# Patient Record
Sex: Female | Born: 1935
Health system: Southern US, Community
[De-identification: ages and names within clinical notes are randomized; demographics above are authoritative.]

## PROBLEM LIST (undated history)

## (undated) DIAGNOSIS — E039 Hypothyroidism, unspecified: Secondary | ICD-10-CM

## (undated) DIAGNOSIS — E079 Disorder of thyroid, unspecified: Secondary | ICD-10-CM

## (undated) DIAGNOSIS — I872 Venous insufficiency (chronic) (peripheral): Secondary | ICD-10-CM

## (undated) DIAGNOSIS — N390 Urinary tract infection, site not specified: Secondary | ICD-10-CM

## (undated) DIAGNOSIS — I480 Paroxysmal atrial fibrillation: Secondary | ICD-10-CM

## (undated) HISTORY — DX: Hypothyroidism, unspecified: E03.9

## (undated) HISTORY — PX: BACK SURGERY: SHX140

## (undated) HISTORY — DX: Paroxysmal atrial fibrillation: I48.0

## (undated) HISTORY — PX: JOINT REPLACEMENT: SHX530

---

## 1998-08-20 ENCOUNTER — Other Ambulatory Visit: Admission: RE | Admit: 1998-08-20 | Discharge: 1998-08-20 | Payer: Self-pay | Admitting: *Deleted

## 1999-09-10 ENCOUNTER — Other Ambulatory Visit: Admission: RE | Admit: 1999-09-10 | Discharge: 1999-09-10 | Payer: Self-pay | Admitting: *Deleted

## 2001-04-21 ENCOUNTER — Other Ambulatory Visit: Admission: RE | Admit: 2001-04-21 | Discharge: 2001-04-21 | Payer: Self-pay | Admitting: *Deleted

## 2002-10-29 ENCOUNTER — Other Ambulatory Visit: Admission: RE | Admit: 2002-10-29 | Discharge: 2002-10-29 | Payer: Self-pay | Admitting: *Deleted

## 2004-10-12 ENCOUNTER — Encounter: Payer: Self-pay | Admitting: Endocrinology

## 2005-01-13 ENCOUNTER — Encounter: Payer: Self-pay | Admitting: Endocrinology

## 2005-07-19 ENCOUNTER — Encounter: Payer: Self-pay | Admitting: Endocrinology

## 2006-07-07 ENCOUNTER — Encounter: Payer: Self-pay | Admitting: Endocrinology

## 2007-08-02 ENCOUNTER — Encounter: Payer: Self-pay | Admitting: Endocrinology

## 2007-09-07 ENCOUNTER — Encounter: Payer: Self-pay | Admitting: Endocrinology

## 2008-10-28 ENCOUNTER — Encounter: Payer: Self-pay | Admitting: Endocrinology

## 2008-12-06 ENCOUNTER — Inpatient Hospital Stay (HOSPITAL_COMMUNITY): Admission: RE | Admit: 2008-12-06 | Discharge: 2008-12-10 | Payer: Self-pay | Admitting: Orthopedic Surgery

## 2009-01-27 ENCOUNTER — Encounter: Payer: Self-pay | Admitting: Endocrinology

## 2009-05-13 ENCOUNTER — Encounter: Payer: Self-pay | Admitting: Endocrinology

## 2009-06-03 ENCOUNTER — Encounter: Admission: RE | Admit: 2009-06-03 | Discharge: 2009-06-03 | Payer: Self-pay | Admitting: Family Medicine

## 2009-06-11 ENCOUNTER — Ambulatory Visit: Payer: Self-pay | Admitting: Endocrinology

## 2009-06-11 DIAGNOSIS — R32 Unspecified urinary incontinence: Secondary | ICD-10-CM

## 2009-06-11 DIAGNOSIS — E890 Postprocedural hypothyroidism: Secondary | ICD-10-CM

## 2009-06-11 HISTORY — DX: Unspecified urinary incontinence: R32

## 2009-06-11 HISTORY — DX: Postprocedural hypothyroidism: E89.0

## 2009-06-25 ENCOUNTER — Encounter: Payer: Self-pay | Admitting: Endocrinology

## 2010-05-19 IMAGING — CR DG HIP 1V PORT*R*
1 series · 1 of 1 positions shown · non-contrast
Comparison: 12/02/2008

CLINICAL DATA: Right hip arthroplasty

PORTABLE RIGHT HIP - 1 VIEW

[view not recorded]
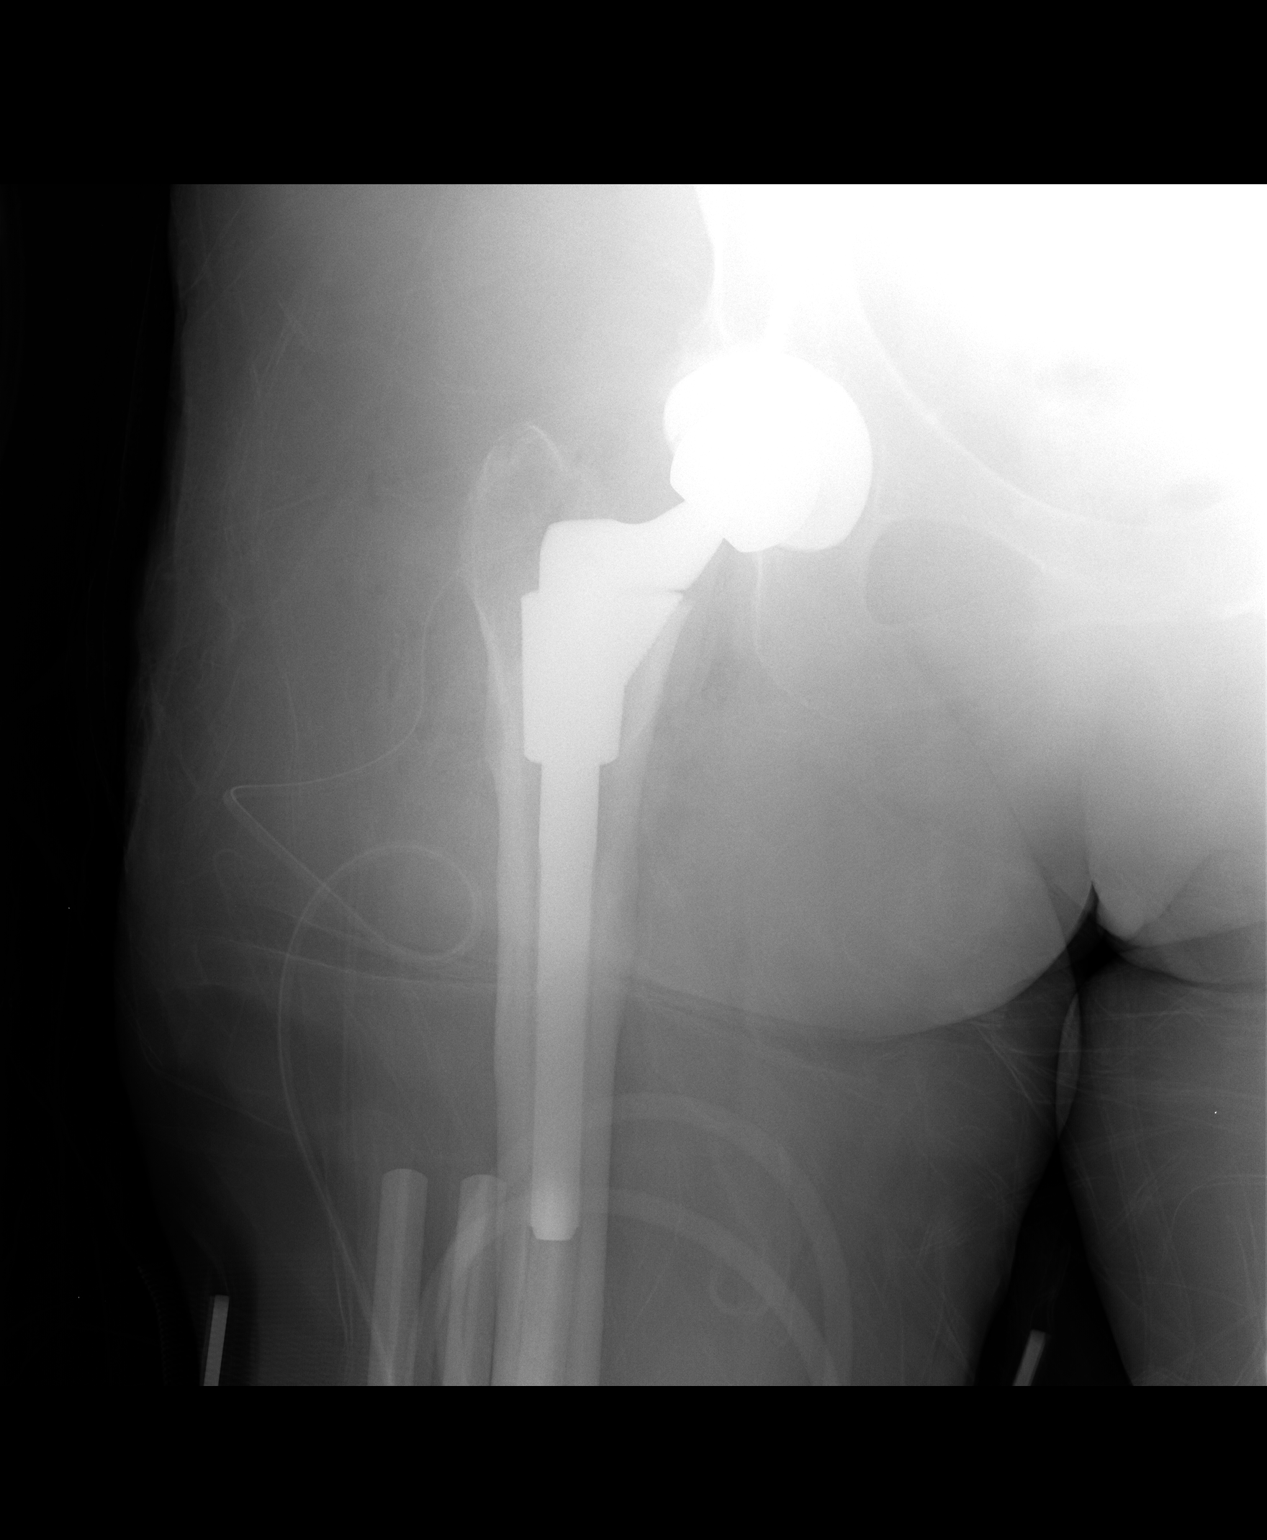

[1 of 1 positions shown; findings below may reference images not displayed]

FINDINGS: Femoral and acetabular components project in expected
location.  Negative for fracture, dislocation, or other acute bone
injury.  Surgical drain projects laterally.
IMPRESSION: Right hip arthroplasty without apparent complication.

## 2010-05-19 IMAGING — CR DG PORTABLE PELVIS
1 series · 1 of 1 positions shown · non-contrast
Comparison: 12/02/2008

CLINICAL DATA: Right hip arthroplasty

PORTABLE PELVIS

[view not recorded]
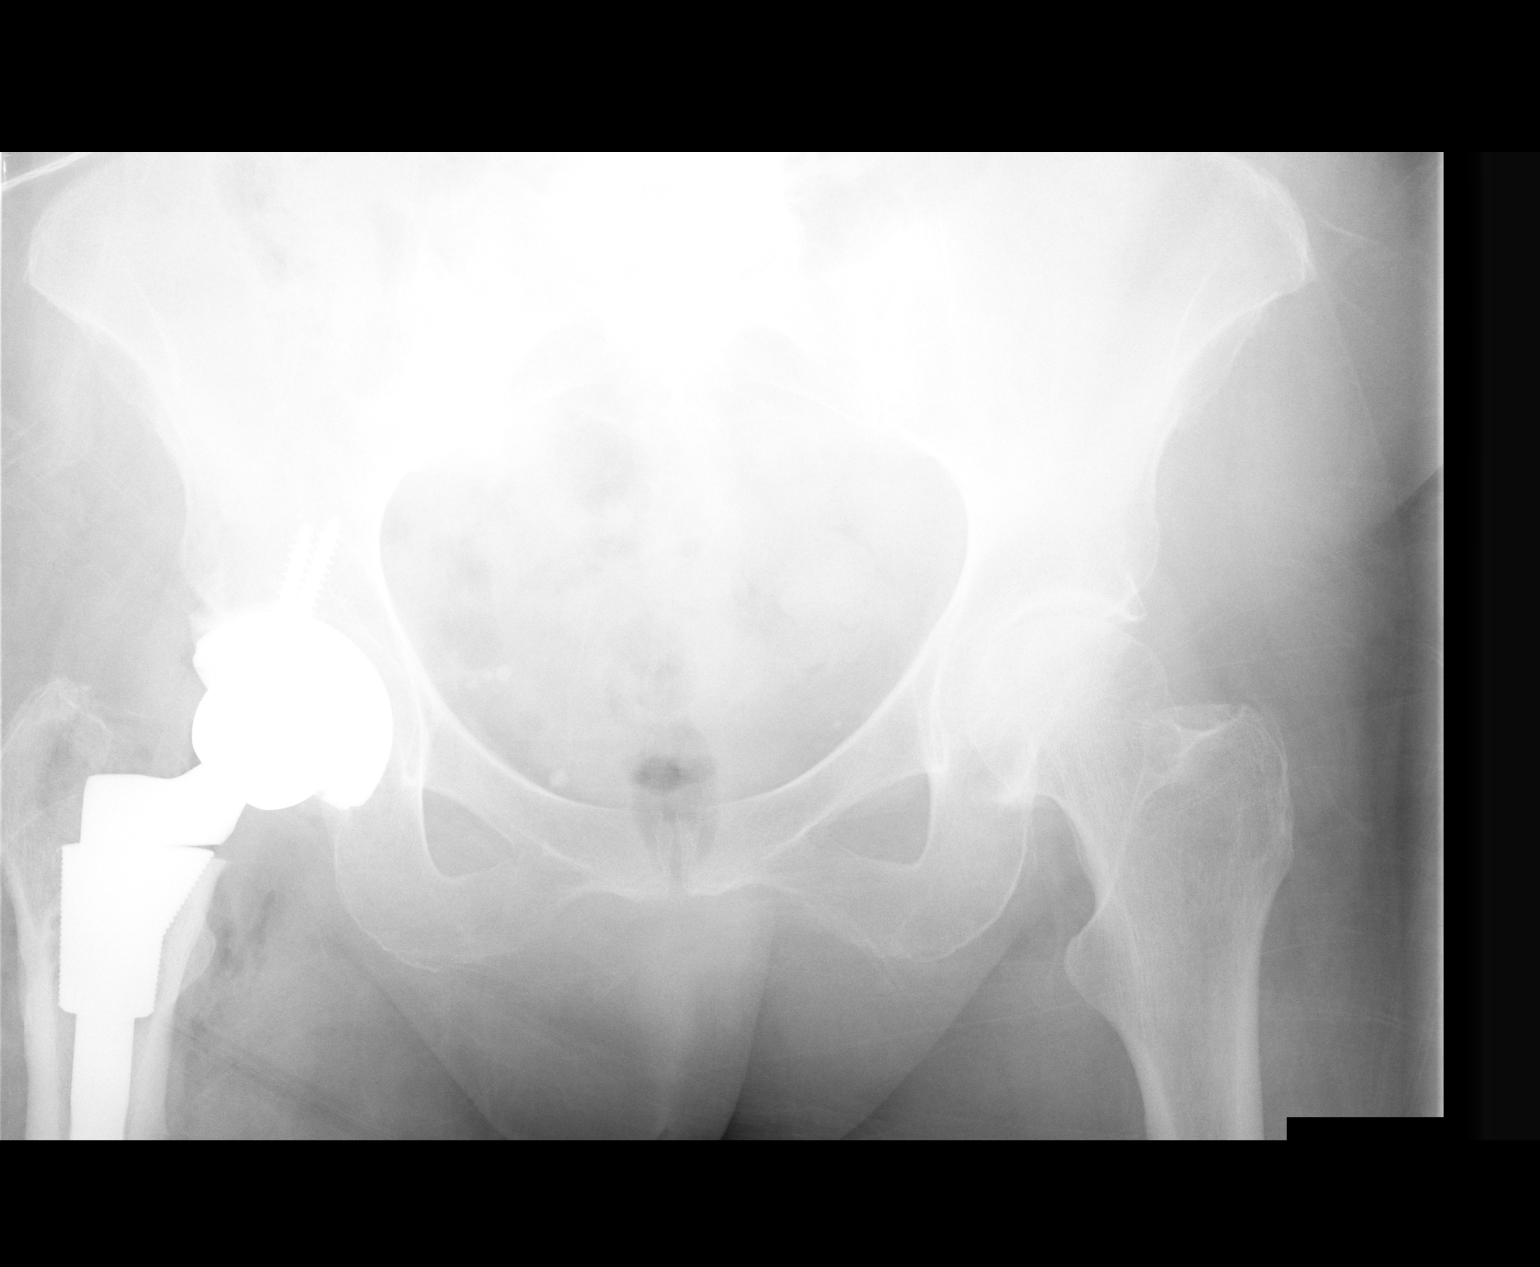

[1 of 1 positions shown; findings below may reference images not displayed]

FINDINGS: Visualized right hip arthroplasty components project in
expected location, distal tip of the femoral component not
included.  Phleboliths in the lower pelvis.  Mild narrowing of the
articular cartilage in the left hip.
IMPRESSION: 1.  Right hip arthroplasty without fracture or other apparent
complication.

## 2010-10-11 ENCOUNTER — Encounter: Payer: Self-pay | Admitting: Neurosurgery

## 2010-12-22 ENCOUNTER — Institutional Professional Consult (permissible substitution): Payer: Self-pay | Admitting: Cardiology

## 2010-12-31 LAB — PROTIME-INR
INR: 1.2 (ref 0.00–1.49)
INR: 1.7 — ABNORMAL HIGH (ref 0.00–1.49)
INR: 1.8 — ABNORMAL HIGH (ref 0.00–1.49)
Prothrombin Time: 15.5 seconds — ABNORMAL HIGH (ref 11.6–15.2)
Prothrombin Time: 21 seconds — ABNORMAL HIGH (ref 11.6–15.2)
Prothrombin Time: 21.9 seconds — ABNORMAL HIGH (ref 11.6–15.2)

## 2010-12-31 LAB — CBC
Hemoglobin: 11.1 g/dL — ABNORMAL LOW (ref 12.0–15.0)
MCHC: 34 g/dL (ref 30.0–36.0)
MCHC: 33.5 g/dL (ref 30.0–36.0)
Platelets: 282 10*3/uL (ref 150–400)
RBC: 2.68 MIL/uL — ABNORMAL LOW (ref 3.87–5.11)
RBC: 3.59 MIL/uL — ABNORMAL LOW (ref 3.87–5.11)
RBC: 3.84 MIL/uL — ABNORMAL LOW (ref 3.87–5.11)
RDW: 13.9 % (ref 11.5–15.5)
RDW: 13.7 % (ref 11.5–15.5)
WBC: 11.1 10*3/uL — ABNORMAL HIGH (ref 4.0–10.5)
WBC: 14.8 10*3/uL — ABNORMAL HIGH (ref 4.0–10.5)

## 2010-12-31 LAB — BASIC METABOLIC PANEL
CO2: 31 mEq/L (ref 19–32)
Calcium: 7.7 mg/dL — ABNORMAL LOW (ref 8.4–10.5)
Calcium: 8 mg/dL — ABNORMAL LOW (ref 8.4–10.5)
Chloride: 101 mEq/L (ref 96–112)
Creatinine, Ser: 0.72 mg/dL (ref 0.4–1.2)
Creatinine, Ser: 0.82 mg/dL (ref 0.4–1.2)
GFR calc Af Amer: >60 mL/min (ref >60)
GFR calc Af Amer: >60 mL/min (ref >60)

## 2010-12-31 LAB — CROSSMATCH
ABO/RH(D): O POS
Antibody Screen: NEGATIVE

## 2010-12-31 LAB — TYPE AND SCREEN
ABO/RH(D): O POS
Antibody Screen: NEGATIVE

## 2010-12-31 LAB — URINALYSIS, ROUTINE W REFLEX MICROSCOPIC
Bilirubin Urine: NEGATIVE
Ketones, ur: NEGATIVE mg/dL
Nitrite: NEGATIVE
Protein, ur: NEGATIVE mg/dL

## 2010-12-31 LAB — COMPREHENSIVE METABOLIC PANEL
ALT: 19 U/L (ref 0–35)
AST: 24 U/L (ref 0–37)
Albumin: 3.6 g/dL (ref 3.5–5.2)
CO2: 30 mEq/L (ref 19–32)
Calcium: 9.5 mg/dL (ref 8.4–10.5)
GFR calc Af Amer: >60 mL/min (ref >60)
Sodium: 143 mEq/L (ref 135–145)
Total Protein: 7.1 g/dL (ref 6.0–8.3)

## 2010-12-31 LAB — ABO/RH: ABO/RH(D): O POS

## 2011-02-02 NOTE — Op Note (Signed)
NAMECLAUDEEN, [name]                ACCOUNT NO.:  [id number]   MEDICAL RECORD NO.:  [id number]          PATIENT TYPE:  INP   LOCATION:  0003                         FACILITY:  [facility]   PHYSICIAN:  Ollen Gross, M.D.    DATE OF BIRTH:  Feb 09, 1932   DATE OF PROCEDURE:  12/06/2008  DATE OF DISCHARGE:                               OPERATIVE REPORT   PREOPERATIVE DIAGNOSIS:  Osteoarthritis, right hip.   POSTOPERATIVE DIAGNOSIS:  Osteoarthritis, right hip.   PROCEDURE:  Right total hip arthroplasty.   SURGEON:  Ollen Gross, MD.   ASSISTANT:  Avel Peace, PA-C.   ANESTHESIA:  Spinal.   ESTIMATED BLOOD LOSS:  700.   DRAIN:  Hemovac x1.   COMPLICATIONS:  None.   CONDITION:  Stable to recovery.   BRIEF CLINICAL NOTE:  [name] is a 77 year old female with end-stage  post-traumatic arthritis of the right hip.  She has had progressively  worsening pain and dysfunction and presents now for total hip  arthroplasty.   PROCEDURE IN DETAIL:  After successful administration of a spinal  anesthetic, the patient is placed in the left lateral decubitus position  with the right side up and held with the hip positioner.  The right  lower extremity is isolated from perineum with plastic drapes and  prepped and draped in the usual sterile fashion.  A standard  posterolateral incision is made with a 10 blade through subcutaneous  tissue to the level of fascia lata which is incised in line with the  skin incision.  The sciatic nerve is palpated and protected.  Short  external rotators are isolated off of the femur.  A capsulectomy is  performed and the hip is dislocated.  The center of the femoral head is  marked and a trial prosthesis placed, such that the center of the trial  head corresponds to the center of her native femoral head.  Osteotomy  line is marked on the femoral neck and osteotomy made with an  oscillating saw.  The femoral head is removed and the femur is retracted  anteriorly to gain acetabular exposure.   Acetabular retractors are placed and labrum and osteophytes are removed.  Acetabular reaming starts at 47-mm, coursing the increments of 2-mm to  55-mm and a 56-mm pinnacle acetabular shell is placed in anatomic  position and transfixed with two dome screws.  A trial 36-mm neutral +4  liner is placed.   The femur is prepared with the canal finder and irrigation.  Axial  reaming is performed to 13.5-mm proximal reaming to an oversized F,  which will be a 40F, and the sleeve machined to a small.  The 40F small  trial sleeve is placed with an 18 x 13 stem and a 36+8 neck, matching  native anteversion.  A 36+0 head is placed and the hip is reduced with  outstanding stability.  There is full extension, full external rotation,  70 degrees flexion, 40 degrees adduction, 90 degrees internal rotation  and 90 degrees of flexion and 70 degrees of internal rotation.  By  placing the right leg on top  of the left, it felt that the leg lengths  were equalized, which is a big improvement from preop.  The hip is then  dislocated and trials removed.  The permanent apex hole eliminator is  placed in the acetabular shell and permanent 36-mm neutral +4 [employer]  liner placed.  On the femoral side we placed the 13F large oversized  sleeve with the 18 x 13 stem and a 36+8 neck, matching native  anteversion.  A 36+0 head is placed and the hip is reduced with the same  stability parameters.  The wound is copiously irrigated with saline  solution and the short external rotators reattached to the femur through  drill holes with Ethibond suture.  The fascia lata is closed over a  Hemovac drain with interrupted #1 Vicryl, subcu closed with #1-0 and #2-  0 Vicryl and subcuticular running #4-0 Monocryl.  The drain is hooked to  suction, incision cleaned and dried and Steri-Strips and a bulky sterile  dressing are applied.  She is placed into a knee immobilizer, awakened  and  transported to recovery in stable condition.      Ollen Gross, M.D.  Electronically Signed     FA/MEDQ  D:  12/06/2008  T:  12/06/2008  Job:  [id number]

## 2011-02-02 NOTE — H&P (Signed)
NAMEKENZI, [name]                ACCOUNT NO.:  [id number]   MEDICAL RECORD NO.:  [id number]          PATIENT TYPE:  INP   LOCATION:                               FACILITY:  [facility]   PHYSICIAN:  Ollen Gross, M.D.    DATE OF BIRTH:  1931-12-12   DATE OF ADMISSION:  12/06/2008  DATE OF DISCHARGE:                              HISTORY & PHYSICAL   DATE OF OFFICE VISIT HISTORY/PHYSICAL:  November 21, 2008.   DATE OF ADMISSION:  December 06, 2008.   CHIEF COMPLAINT:  Right hip pain.   HISTORY OF PRESENT ILLNESS:  The patient is a 77 year old female who has  been seen by Dr. Lequita Halt for ongoing right hip pain.  It has been  progressive for quite some time now.  She had a fracture back in the  1980s and had screws put in and eventually had the screws removed.  Unfortunately she went on to develop arthritis which has been  progressive in nature.  She is at a point now where she is end-stage and  felt to benefit from undergoing surgical intervention.  She has been  seen preoperatively by Dr. Martha Clan and felt to be stable for surgery.   ALLERGIES:  1. PENICILLIN CAUSES HIVES.  2. Questionable nickel allergy.   CURRENT MEDICATIONS:  Synthroid, calcium, fish oil, vitamin D, aspirin,  Centrum Silver.   PAST MEDICAL HISTORY:  1. Peripheral neuropathy.  2. Vitamin D deficiency.  3. Hypercholesterolemia.  4. Venous insufficiency.  5. Overactive bladder.  6. Urinary incontinence.  7. Hypothyroidism (previous hyperthyroidism and underwent radioactive      iodine treatment).  8. Osteopenia.  9. History of gout.  10.Postmenopausal.  11.History of childhood mumps and also eczema.   PAST SURGICAL HISTORY:  1. Lumbar disk surgery 1972.  2. Hip pinning 1980s with subsequent removal of pins.  3. Right ankle surgery.  4. Tubal ligation.   FAMILY HISTORY:  Father with a history of pneumonia, congestive heart  failure, prostate cancer.  Mother with history of esophageal cancer and  hypertension.   One brother age 16 with heart attack.  Another brother  age 47 with emphysema.   SOCIAL HISTORY:  Widowed, retired Runner, broadcasting/film/video.  Past smoker.  Rare  occasional intake of wine.  Two children.  Lives alone.  She does want  to look into a skilled rehab facility postop.  She wants to look around  [place] first, then in [place] due to her family.   REVIEW OF SYSTEMS:  GENERAL:  No fevers, chills, night sweats.  NEURO:  History of peripheral neuropathy.  No seizures, syncope or paralysis.  RESPIRATORY:  No shortness of breath, productive cough or hemoptysis.  CARDIOVASCULAR:  No chest pains, angina, orthopnea.  GI:  No nausea,  vomiting, diarrhea or constipation.  GU:  No dysuria, hematuria or  discharge.  She does have a little bit of overactive bladder  incontinence.  MUSCULOSKELETAL:  Hip pain.   PHYSICAL EXAM:  VITAL SIGNS:  Pulse 72, respirations 14, blood pressure  148/84.  GENERAL:  77 year old white female well-nourished, well-developed, in no  acute distress.  She is alert, oriented and cooperative.  HEENT:  Normocephalic, atraumatic.  Pupils are round and reactive.  EOMs  intact.  No wear glasses.  NECK:  Supple.  CHEST:  Clear.  HEART:  Regular rate and rhythm.  No murmur.  ABDOMEN:  Soft, nontender.  Bowel sounds present.  RECTAL/BREASTS/GENITALIA:  Not done.  Not pertinent to present illness.  EXTREMITIES:  Right hip flexion 90, zero internal rotation, 20 degrees  external rotation, 20 degrees abduction.   IMPRESSION:  Osteoarthritis right hip.   PLAN:  The patient will be admitted to [facility] to undergo a  right total hip replacement arthroplasty.  Surgery will be performed by  Dr. Ollen Gross.      Alexzandrew L. Perkins, P.A.C.      Ollen Gross, M.D.  Electronically Signed    ALP/MEDQ  D:  12/05/2008  T:  12/05/2008  Job:  [id number]   cc:   Ollen Gross, M.D.  Fax: [phone]   Arlan Organ  Fax: [phone]

## 2011-02-02 NOTE — Discharge Summary (Signed)
[name], [name]                ACCOUNT NO.:  [id number]   MEDICAL RECORD NO.:  [id number]          PATIENT TYPE:  INP   LOCATION:  1607                         FACILITY:  [facility]   PHYSICIAN:  [name], M.D.    DATE OF BIRTH:  Sep 27, 1931   DATE OF ADMISSION:  12/06/2008  DATE OF DISCHARGE:  12/10/2008                               DISCHARGE SUMMARY   ADMISSION DIAGNOSES:  1. Osteoarthritis right hip.  2. Peripheral neuropathy.  3. Vitamin D deficiency.  4. Hypercholesterolemia.  5. Venous insufficiency.  6. Overactive bladder.  7. Urinary incontinence.  8. Hypothyroidism (previous hyperthyroidism and underwent radioactive      iodine treatment).  9. Osteopenia.  10.History of gout.  11.Postmenopausal.  12.Childhood history of mumps.  13.Eczema.   DISCHARGE DIAGNOSES:  1. Osteoarthritis right hip status post right total hip replacement      arthroplasty.  2. Hyponatremia.  3. Peripheral neuropathy.  4. Vitamin D deficiency.  5. Hypercholesterolemia.  6. Venous insufficiency.  7. Overactive bladder.  8. Urinary incontinence.  9. Hypothyroidism (previous hyperthyroidism and underwent radioactive      iodine treatment).  10.Osteopenia.  11.History of gout.  12.Postmenopausal.  13.Childhood history of mumps.  14.Eczema.  15.Postoperative acute blood loss anemia.  16.Status post transfusion.   PROCEDURE:  On 12/06/2008, right total hip.  Surgeon, Dr. Lequita [name].  Assistant, Victoria Peace, PA-C.  Spinal anesthesia.   CONSULTS:  None.   BRIEF HISTORY:  [name] is a 77 year old female with end-stage post-  traumatic arthritis of the right hip, progressive worsening pain and  dysfunction, now presents for a total hip arthroplasty.   LABORATORY DATA:  Preop CBC showed hemoglobin 12.6, hematocrit 37.6,  white cell count 7.6, platelets 282.  Protime INR preop 13.4 and 1with a  PTT of 34.  Chem panel on admission all within normal limits.  Preop UA  showed small  leukocyte esterase, 0-2 white cells, otherwise negative.  Serial CBCs were followed.  Hemoglobin dropped down to 8.9 and came back  up to 11.7 after blood and last noted H and H 11.1 at 32.8. Serial  protimes followed per Coumadin protocol.  Last known PT/INR 21.9 and  1.8.  Serial BMETs were followed.  Sodium did drop from 143 to 134, back  up to 137, remaining electrolytes remained within normal limits.   X-RAYS:  Right hip film December 02, 2008, post-traumatic deformity of the  right hip, moderate associated degenerative changes.   EKG, December 02, 2008, normal sinus rhythm, low voltage QRS, no previous  tracing, confirmed by Dr. Nona [name].   HOSPITAL COURSE:  The patient was admitted to [facility] and  taken to the OR, underwent the above-stated procedure without  complication.  The patient tolerated the procedure well and later  transferred to the recovery room on orthopedic floor, started on PCA and  p.o. analgesic pain control following surgery and given 24 hours postop  IV antibiotics.  Doing pretty well on the morning of day #1.  We  discontinued the knee immobilizer.  Hemovac was removed.  Hemoglobin was  down  to 8.9 and due to the drop the first day, it was felt she would  benefit from undergoing blood.  She was given 2 units of packed cells,  tolerated blood well.  Hemoglobin came back up.  Discontinued the PCA  later that day and the Foley on postop day #2.  She was doing a little  bit better after the blood.  Hemoglobin was up to 11.7.  Hip incision  looked good.  We changed the dressing on day #2.  She started getting up  with therapy each day.  Stayed through the weekend.  She did want to  look into a skilled nursing facility.  Continued to receive therapy and  slowly progressing, felt to be an excellent candidate.  She was seen on  rounds on December 10, 2008, by Dr. Lequita [name].  She was stable.  We were  waiting for a bed and it was decided the patient could be  transferred  out at that time.   DISCHARGE/PLAN:  1. The patient to be transferred to skilled nursing facility of choice      on December 10, 2008.  2. Discharge diagnoses:  Please see above.  3. Discharge medications:  Current medications at time of discharge      include      a.     Levothyroxine 88 mcg p.o. daily.      b.     She is on Coumadin protocol.  Please titrate the Coumadin       level with target INR between 2 and 3.  She is to be on Coumadin       for 3 weeks from the date of surgery 12/06/2008.  Please see below       for Coumadin protocol in the hospital.      c.     Colace 100 mg p.o. b.i.d.      d.     Percocet 5 mg 1 or 2 every 4 hours as needed for pain.      e.     Tylenol 325 one or two every 4-6 hours as needed for mild       pain, temperature or headache.      f.     Robaxin 500 mg p.o. q.6-8 h  p.r.n. spasm.      g.     Laxative of choice.      h.     Enema of choice.      i.     Oxybutynin gel p.r.n.  4. Diet:  Low cholesterol diet.  5. Activity:  She is partial weightbearing 25-50% to the right lower      extremity.  Continue gait training, ambulation and ADLs.  Hip      precautions for total hip protocol.  PT and OT for mobility and      therapy.  She may start showering, however, do not submerge the      incision under water.  6. Follow-up:  She needs to follow up with Dr. Lequita [name] in the office      in about 2 weeks from the date of surgery, please contact the      office at [phone] to help arrange appointment time and follow-up      for this patient.   DISPOSITION:  Pending at the time of this dictation.   CONDITION ON DISCHARGE:  Improving.      [name], P.A.C.      [name], M.D.  Electronically Signed    ALP/MEDQ  D:  12/10/2008  T:  12/10/2008  Job:  [id number]   cc:   [name]  Fax: [phone]   with pt. to nursing facility of choice

## 2012-11-26 ENCOUNTER — Encounter (HOSPITAL_COMMUNITY): Payer: Self-pay | Admitting: Emergency Medicine

## 2012-11-26 ENCOUNTER — Emergency Department (HOSPITAL_COMMUNITY)
Admission: EM | Admit: 2012-11-26 | Discharge: 2012-11-26 | Disposition: A | Discharge status: Home / Self Care | Payer: PRIVATE HEALTH INSURANCE | Attending: Emergency Medicine | Admitting: Emergency Medicine

## 2012-11-26 ENCOUNTER — Emergency Department (HOSPITAL_COMMUNITY): Payer: PRIVATE HEALTH INSURANCE

## 2012-11-26 DIAGNOSIS — R32 Unspecified urinary incontinence: Secondary | ICD-10-CM | POA: Insufficient documentation

## 2012-11-26 DIAGNOSIS — R5381 Other malaise: Secondary | ICD-10-CM | POA: Insufficient documentation

## 2012-11-26 DIAGNOSIS — Z96659 Presence of unspecified artificial knee joint: Secondary | ICD-10-CM | POA: Insufficient documentation

## 2012-11-26 DIAGNOSIS — R300 Dysuria: Secondary | ICD-10-CM | POA: Insufficient documentation

## 2012-11-26 DIAGNOSIS — R61 Generalized hyperhidrosis: Secondary | ICD-10-CM | POA: Insufficient documentation

## 2012-11-26 DIAGNOSIS — Z87440 Personal history of urinary (tract) infections: Secondary | ICD-10-CM | POA: Insufficient documentation

## 2012-11-26 DIAGNOSIS — M109 Gout, unspecified: Secondary | ICD-10-CM | POA: Insufficient documentation

## 2012-11-26 DIAGNOSIS — M25439 Effusion, unspecified wrist: Secondary | ICD-10-CM | POA: Insufficient documentation

## 2012-11-26 DIAGNOSIS — I872 Venous insufficiency (chronic) (peripheral): Secondary | ICD-10-CM | POA: Insufficient documentation

## 2012-11-26 DIAGNOSIS — Z96649 Presence of unspecified artificial hip joint: Secondary | ICD-10-CM | POA: Insufficient documentation

## 2012-11-26 DIAGNOSIS — R5383 Other fatigue: Secondary | ICD-10-CM | POA: Insufficient documentation

## 2012-11-26 DIAGNOSIS — N390 Urinary tract infection, site not specified: Secondary | ICD-10-CM | POA: Insufficient documentation

## 2012-11-26 DIAGNOSIS — E079 Disorder of thyroid, unspecified: Secondary | ICD-10-CM | POA: Insufficient documentation

## 2012-11-26 HISTORY — DX: Urinary tract infection, site not specified: N39.0

## 2012-11-26 HISTORY — DX: Venous insufficiency (chronic) (peripheral): I87.2

## 2012-11-26 HISTORY — DX: Disorder of thyroid, unspecified: E07.9

## 2012-11-26 LAB — CBC
HCT: 36.7 % (ref 36.0–46.0)
MCH: 31.2 pg (ref 26.0–34.0)
MCV: 92.4 fL (ref 78.0–100.0)
Platelets: 299 10*3/uL (ref 150–400)
RBC: 3.97 MIL/uL (ref 3.87–5.11)
RDW: 13.2 % (ref 11.5–15.5)

## 2012-11-26 LAB — HEPATIC FUNCTION PANEL
ALT: 8 U/L (ref 0–35)
Alkaline Phosphatase: 112 U/L (ref 39–117)
Bilirubin, Direct: <0.1 mg/dL (ref 0.0–0.3)
Total Bilirubin: 0.3 mg/dL (ref 0.3–1.2)

## 2012-11-26 LAB — URINALYSIS, ROUTINE W REFLEX MICROSCOPIC
Bilirubin Urine: NEGATIVE
Ketones, ur: NEGATIVE mg/dL
Nitrite: POSITIVE — AB
Protein, ur: 100 mg/dL — AB
Urobilinogen, UA: 0.2 mg/dL (ref 0.0–1.0)

## 2012-11-26 LAB — BASIC METABOLIC PANEL
BUN: 24 mg/dL — ABNORMAL HIGH (ref 6–23)
CO2: 24 mEq/L (ref 19–32)
Calcium: 8.9 mg/dL (ref 8.4–10.5)
Creatinine, Ser: 0.85 mg/dL (ref 0.50–1.10)

## 2012-11-26 LAB — URINE MICROSCOPIC-ADD ON

## 2012-11-26 MED ORDER — CEPHALEXIN 500 MG PO CAPS
1000.0000 mg | ORAL_CAPSULE | Freq: Once | ORAL | Status: AC
  Administered 2012-11-26: 1000 mg via ORAL
  Filled 2012-11-26: qty 2

## 2012-11-26 MED ORDER — PREDNISONE 50 MG PO TABS: 50.0000 mg | ORAL_TABLET | Freq: Every day | ORAL | Status: DC

## 2012-11-26 MED ORDER — CEPHALEXIN 500 MG PO CAPS: 500.0000 mg | ORAL_CAPSULE | Freq: Four times a day (QID) | ORAL | Status: DC

## 2012-11-26 MED ORDER — DEXTROSE 5 % IV SOLN
1.0000 g | INTRAVENOUS | Status: DC
  Filled 2012-11-26: qty 10

## 2012-11-26 MED ORDER — PREDNISONE 20 MG PO TABS
60.0000 mg | ORAL_TABLET | Freq: Once | ORAL | Status: AC
  Administered 2012-11-26: 60 mg via ORAL
  Filled 2012-11-26: qty 3

## 2012-11-26 MED ORDER — TRAMADOL HCL 50 MG PO TABS: 50.0000 mg | ORAL_TABLET | Freq: Four times a day (QID) | ORAL | Status: DC | PRN

## 2012-11-26 NOTE — ED Notes (Signed)
Pt states she has had general malaise and left arm swelling/pain x 4 days.  States that she gets UTIs a lot and is wondering if she has one of those because she has fatigue/weakness x 2 days.

## 2012-11-26 NOTE — ED Provider Notes (Signed)
History     CSN: [id number]  Arrival date & time 11/26/12  1256   First MD Initiated Contact with Patient 11/26/12 1510      Chief Complaint  Patient presents with  . Fatigue  . Arm Swelling    (Consider location/radiation/quality/duration/timing/severity/associated sxs/prior treatment) The history is provided by the patient.   79 year old female has had some pain and swelling in her left wrist for the last 4 days. It is getting worse over the last 2 days. Pain is worse with movement and that if she holds still. Pain is moderate at rest and she rates it at 3/10 but is severe and she was and she rates it at 8/10. She describes the pain as dull and throbbing. At one point in the past she had a similar complaint which she was told may have been gout and she was given a prescription for allopurinol. She had not been taking it but took a dose yesterday. Following that, her face got flushed so she decided not to take anymore. She did take a dose of Aleve yesterday which did allow pain to subside to a level that she was able to sleep. His second complaint is generalized fatigue which has been present for the last 2 days. This is been associated with some mild dysuria without any urinary frequency. There's been no change in her long-standing urinary incontinence. She denies fever or chills. On one occasion, she will to the bathroom and back when she got back she broke out in a sweat. Denies chest pain, heaviness, tightness, pressure.  Past Medical History  Diagnosis Date  . Venous insufficiency   . Thyroid disease   . UTI (urinary tract infection)     Past Surgical History  Procedure Laterality Date  . Back surgery    . Joint replacement      hip and knee    History reviewed. No pertinent family history.  History  Substance Use Topics  . Smoking status: Never Smoker   . Smokeless tobacco: Not on file  . Alcohol Use: No    OB History   Grav Para Term Preterm Abortions TAB SAB Ect  Mult Living                  Review of Systems  All other systems reviewed and are negative.    Allergies  Penicillins  Home Medications  No current outpatient prescriptions on file.  BP 158/79  Pulse 92  Temp(Src) 97.6 F (36.4 C) (Oral)  Resp 18  SpO2 95%  Physical Exam  Nursing note and vitals reviewed.  79 year old female, resting comfortably and in no acute distress. Vital signs are significant for hypertension with blood pressure 150/79. Oxygen saturation is 95%, which is normal. Head is normocephalic and atraumatic. PERRLA, EOMI. Oropharynx is clear. Neck is nontender and supple without adenopathy or JVD. Back is nontender and there is no CVA tenderness. Lungs are clear without rales, wheezes, or rhonchi. Chest is nontender. Heart has regular rate and rhythm without murmur. Abdomen is soft, flat, nontender without masses or hepatosplenomegaly and peristalsis is normoactive. Extremities have trace pitting edema. There is mild swelling and mild/moderate erythema over the left wrist with suggestive a joint effusion. There is pain with passive range of motion. There is minimal warmth. Distal neurovascular exam is intact with normal sensation and prompt capillary refill. Radial pulse is normal. There are no lymphangitic streaks and no axillary or epitrochlear adenopathy. Skin is warm and dry without rash.  Neurologic: Mental status is normal, cranial nerves are intact, there are no motor or sensory deficits.  ED Course  Procedures (including critical care time)  Results for orders placed during the hospital encounter of 11/26/12  CBC      Result Value Range   WBC 13.0 (*) 4.0 - 10.5 K/uL   RBC 3.97  3.87 - 5.11 MIL/uL   Hemoglobin 12.4  12.0 - 15.0 g/dL   HCT 40.9  81.1 - 91.4 %   MCV 92.4  78.0 - 100.0 fL   MCH 31.2  26.0 - 34.0 pg   MCHC 33.8  30.0 - 36.0 g/dL   RDW 78.2  95.6 - 21.3 %   Platelets 299  150 - 400 K/uL  BASIC METABOLIC PANEL      Result Value  Range   Sodium 133 (*) 135 - 145 mEq/L   Potassium 3.8  3.5 - 5.1 mEq/L   Chloride 97  96 - 112 mEq/L   CO2 24  19 - 32 mEq/L   Glucose, Bld 106 (*) 70 - 99 mg/dL   BUN 24 (*) 6 - 23 mg/dL   Creatinine, Ser 0.86  0.50 - 1.10 mg/dL   Calcium 8.9  8.4 - 57.8 mg/dL   GFR calc non Af Amer 64 (*) >90 mL/min   GFR calc Af Amer 74 (*) >90 mL/min  URINALYSIS, ROUTINE W REFLEX MICROSCOPIC      Result Value Range   Color, Urine YELLOW  YELLOW   APPearance TURBID (*) CLEAR   Specific Gravity, Urine 1.028  1.005 - 1.030   pH 5.5  5.0 - 8.0   Glucose, UA NEGATIVE  NEGATIVE mg/dL   Hgb urine dipstick LARGE (*) NEGATIVE   Bilirubin Urine NEGATIVE  NEGATIVE   Ketones, ur NEGATIVE  NEGATIVE mg/dL   Protein, ur 469 (*) NEGATIVE mg/dL   Urobilinogen, UA 0.2  0.0 - 1.0 mg/dL   Nitrite POSITIVE (*) NEGATIVE   Leukocytes, UA LARGE (*) NEGATIVE  TROPONIN I      Result Value Range   Troponin I <0.30  <0.30 ng/mL  URIC ACID      Result Value Range   Uric Acid, Serum 6.6  2.4 - 7.0 mg/dL  HEPATIC FUNCTION PANEL      Result Value Range   Total Protein 7.8  6.0 - 8.3 g/dL   Albumin 3.2 (*) 3.5 - 5.2 g/dL   AST 15  0 - 37 U/L   ALT 8  0 - 35 U/L   Alkaline Phosphatase 112  39 - 117 U/L   Total Bilirubin 0.3  0.3 - 1.2 mg/dL   Bilirubin, Direct <6.2  0.0 - 0.3 mg/dL   Indirect Bilirubin NOT CALCULATED  0.3 - 0.9 mg/dL  URINE MICROSCOPIC-ADD ON      Result Value Range   Squamous Epithelial / LPF RARE  RARE   WBC, UA TOO NUMEROUS TO COUNT  <3 WBC/hpf   Bacteria, UA MANY (*) RARE   Urine-Other FIELD OBSCURED BY WBC'S     Dg Chest 2 View  11/26/2012  *RADIOLOGY REPORT*  Clinical Data: Fatigue and arm swelling.  CHEST - 2 VIEW  Comparison: 07/26/2012  Findings: Lateral view degraded by patient arm position.  Midline trachea.  Normal heart size and mediastinal contours. No pleural effusion or pneumothorax.  Clear lungs.  IMPRESSION: No acute cardiopulmonary disease.   Original Report Authenticated By:  Jeronimo Greaves, M.D.       Date: 11/26/2012  Rate:  90  Rhythm: normal sinus rhythm  QRS Axis: normal  Intervals: normal  ST/T Wave abnormalities: normal  Conduction Disutrbances:none  Narrative Interpretation: RSR' in V1 indicating either right ventricular conduction delay or ventricular hypertrophy. When compared with ECG of 12/02/2008, no significant changes are seen.  Old EKG Reviewed: unchanged    1. Urinary tract infection   2. Idiopathic gout of wrist, left       MDM  Left wrist pain and swelling which seems most consistent with gout. Your cast level be checked. Urinalysis only danger to assess for possible UTI. Given her episode of diaphoresis following mild exertion, ECG and troponin level will be checked.  She shows definite evidence of urinary tract infection. Uric acid level is in the normal range, but that she still clinically has gout and will be treated appropriately. With normal uric acid, allopurinol would have relatively little use in preventing attacks and she is advised of this. She states she had a recent urinary tract infection which was treated with ciprofloxacin, so she will be treated with cephalexin this time. She's given prescription for cephalexin, prednisone, and tramadol.     Dione Booze, MD 11/26/12 [phone]

## 2012-11-28 LAB — URINE CULTURE: Colony Count: >=100000

## 2012-11-30 NOTE — ED Notes (Signed)
+   Urine Patient treated with Keflex-sensitive to same-chart appended per protocol MD. 

## 2014-04-04 DIAGNOSIS — N390 Urinary tract infection, site not specified: Secondary | ICD-10-CM | POA: Insufficient documentation

## 2014-04-04 HISTORY — DX: Urinary tract infection, site not specified: N39.0

## 2014-07-22 DIAGNOSIS — M545 Low back pain, unspecified: Secondary | ICD-10-CM | POA: Insufficient documentation

## 2014-07-22 DIAGNOSIS — N318 Other neuromuscular dysfunction of bladder: Secondary | ICD-10-CM | POA: Insufficient documentation

## 2014-07-22 DIAGNOSIS — E669 Obesity, unspecified: Secondary | ICD-10-CM | POA: Insufficient documentation

## 2014-07-22 DIAGNOSIS — I872 Venous insufficiency (chronic) (peripheral): Secondary | ICD-10-CM

## 2014-07-22 DIAGNOSIS — M159 Polyosteoarthritis, unspecified: Secondary | ICD-10-CM | POA: Insufficient documentation

## 2014-07-22 DIAGNOSIS — E039 Hypothyroidism, unspecified: Secondary | ICD-10-CM

## 2014-07-22 DIAGNOSIS — N3941 Urge incontinence: Secondary | ICD-10-CM

## 2014-07-22 DIAGNOSIS — M8580 Other specified disorders of bone density and structure, unspecified site: Secondary | ICD-10-CM | POA: Insufficient documentation

## 2014-07-22 DIAGNOSIS — E7800 Pure hypercholesterolemia, unspecified: Secondary | ICD-10-CM

## 2014-07-22 DIAGNOSIS — I130 Hypertensive heart and chronic kidney disease with heart failure and stage 1 through stage 4 chronic kidney disease, or unspecified chronic kidney disease: Secondary | ICD-10-CM

## 2014-07-22 DIAGNOSIS — M26629 Arthralgia of temporomandibular joint, unspecified side: Secondary | ICD-10-CM | POA: Insufficient documentation

## 2014-07-22 DIAGNOSIS — I1310 Hypertensive heart and chronic kidney disease without heart failure, with stage 1 through stage 4 chronic kidney disease, or unspecified chronic kidney disease: Secondary | ICD-10-CM

## 2014-07-22 DIAGNOSIS — N182 Chronic kidney disease, stage 2 (mild): Secondary | ICD-10-CM

## 2014-07-22 DIAGNOSIS — R5383 Other fatigue: Secondary | ICD-10-CM

## 2014-07-22 DIAGNOSIS — I517 Cardiomegaly: Secondary | ICD-10-CM

## 2014-07-22 DIAGNOSIS — R350 Frequency of micturition: Secondary | ICD-10-CM | POA: Insufficient documentation

## 2014-07-22 DIAGNOSIS — I8390 Asymptomatic varicose veins of unspecified lower extremity: Secondary | ICD-10-CM | POA: Insufficient documentation

## 2014-07-22 DIAGNOSIS — M109 Gout, unspecified: Secondary | ICD-10-CM | POA: Insufficient documentation

## 2014-07-22 DIAGNOSIS — M5430 Sciatica, unspecified side: Secondary | ICD-10-CM

## 2014-07-22 DIAGNOSIS — B351 Tinea unguium: Secondary | ICD-10-CM | POA: Insufficient documentation

## 2014-07-22 DIAGNOSIS — L270 Generalized skin eruption due to drugs and medicaments taken internally: Secondary | ICD-10-CM

## 2014-07-22 DIAGNOSIS — F419 Anxiety disorder, unspecified: Secondary | ICD-10-CM

## 2014-07-22 DIAGNOSIS — E538 Deficiency of other specified B group vitamins: Secondary | ICD-10-CM

## 2014-07-22 DIAGNOSIS — R948 Abnormal results of function studies of other organs and systems: Secondary | ICD-10-CM | POA: Insufficient documentation

## 2014-07-22 DIAGNOSIS — M5030 Other cervical disc degeneration, unspecified cervical region: Secondary | ICD-10-CM

## 2014-07-22 DIAGNOSIS — F321 Major depressive disorder, single episode, moderate: Secondary | ICD-10-CM

## 2014-07-22 DIAGNOSIS — E559 Vitamin D deficiency, unspecified: Secondary | ICD-10-CM

## 2014-07-22 DIAGNOSIS — R269 Unspecified abnormalities of gait and mobility: Secondary | ICD-10-CM

## 2014-07-22 HISTORY — DX: Generalized skin eruption due to drugs and medicaments taken internally: L27.0

## 2014-07-22 HISTORY — DX: Arthralgia of temporomandibular joint, unspecified side: M26.629

## 2014-07-22 HISTORY — DX: Cardiomegaly: I51.7

## 2014-07-22 HISTORY — DX: Tinea unguium: B35.1

## 2014-07-22 HISTORY — DX: Venous insufficiency (chronic) (peripheral): I87.2

## 2014-07-22 HISTORY — DX: Other neuromuscular dysfunction of bladder: N31.8

## 2014-07-22 HISTORY — DX: Deficiency of other specified B group vitamins: E53.8

## 2014-07-22 HISTORY — DX: Unspecified abnormalities of gait and mobility: R26.9

## 2014-07-22 HISTORY — DX: Sciatica, unspecified side: M54.30

## 2014-07-22 HISTORY — DX: Hypothyroidism, unspecified: E03.9

## 2014-07-22 HISTORY — DX: Obesity, unspecified: E66.9

## 2014-07-22 HISTORY — DX: Low back pain, unspecified: M54.50

## 2014-07-22 HISTORY — DX: Abnormal results of function studies of other organs and systems: R94.8

## 2014-07-22 HISTORY — DX: Other specified disorders of bone density and structure, unspecified site: M85.80

## 2014-07-22 HISTORY — DX: Urge incontinence: N39.41

## 2014-07-22 HISTORY — DX: Gout, unspecified: M10.9

## 2014-07-22 HISTORY — DX: Chronic kidney disease, stage 2 (mild): N18.2

## 2014-07-22 HISTORY — DX: Hypertensive heart and chronic kidney disease without heart failure, with stage 1 through stage 4 chronic kidney disease, or unspecified chronic kidney disease: I13.10

## 2014-07-22 HISTORY — DX: Other cervical disc degeneration, unspecified cervical region: M50.30

## 2014-07-22 HISTORY — DX: Hypertensive heart and chronic kidney disease with heart failure and stage 1 through stage 4 chronic kidney disease, or unspecified chronic kidney disease: I13.0

## 2014-07-22 HISTORY — DX: Frequency of micturition: R35.0

## 2014-07-22 HISTORY — DX: Vitamin D deficiency, unspecified: E55.9

## 2014-07-22 HISTORY — DX: Pure hypercholesterolemia, unspecified: E78.00

## 2014-07-22 HISTORY — DX: Anxiety disorder, unspecified: F41.9

## 2014-07-22 HISTORY — DX: Asymptomatic varicose veins of unspecified lower extremity: I83.90

## 2014-07-22 HISTORY — DX: Other fatigue: R53.83

## 2014-07-22 HISTORY — DX: Major depressive disorder, single episode, moderate: F32.1

## 2014-07-22 HISTORY — DX: Polyosteoarthritis, unspecified: M15.9

## 2014-10-08 DIAGNOSIS — R310 Gross hematuria: Secondary | ICD-10-CM | POA: Insufficient documentation

## 2014-10-08 HISTORY — DX: Gross hematuria: R31.0

## 2014-11-15 DIAGNOSIS — N3001 Acute cystitis with hematuria: Secondary | ICD-10-CM | POA: Insufficient documentation

## 2014-11-15 HISTORY — DX: Acute cystitis with hematuria: N30.01

## 2015-12-11 DIAGNOSIS — G609 Hereditary and idiopathic neuropathy, unspecified: Secondary | ICD-10-CM | POA: Insufficient documentation

## 2015-12-11 DIAGNOSIS — Z79899 Other long term (current) drug therapy: Secondary | ICD-10-CM

## 2015-12-11 DIAGNOSIS — M1A09X0 Idiopathic chronic gout, multiple sites, without tophus (tophi): Secondary | ICD-10-CM

## 2015-12-11 DIAGNOSIS — M48061 Spinal stenosis, lumbar region without neurogenic claudication: Secondary | ICD-10-CM | POA: Insufficient documentation

## 2015-12-11 DIAGNOSIS — N390 Urinary tract infection, site not specified: Secondary | ICD-10-CM

## 2015-12-11 HISTORY — DX: Idiopathic chronic gout, multiple sites, without tophus (tophi): M1A.09X0

## 2015-12-11 HISTORY — DX: Other long term (current) drug therapy: Z79.899

## 2015-12-11 HISTORY — DX: Urinary tract infection, site not specified: N39.0

## 2015-12-11 HISTORY — DX: Spinal stenosis, lumbar region without neurogenic claudication: M48.061

## 2015-12-11 HISTORY — DX: Hereditary and idiopathic neuropathy, unspecified: G60.9

## 2017-10-20 DIAGNOSIS — I4510 Unspecified right bundle-branch block: Secondary | ICD-10-CM

## 2017-10-20 DIAGNOSIS — Z87891 Personal history of nicotine dependence: Secondary | ICD-10-CM

## 2017-10-20 HISTORY — DX: Unspecified right bundle-branch block: I45.10

## 2017-10-20 HISTORY — DX: Personal history of nicotine dependence: Z87.891

## 2017-12-08 DIAGNOSIS — I272 Pulmonary hypertension, unspecified: Secondary | ICD-10-CM | POA: Insufficient documentation

## 2017-12-08 HISTORY — DX: Pulmonary hypertension, unspecified: I27.20

## 2018-04-17 DIAGNOSIS — I493 Ventricular premature depolarization: Secondary | ICD-10-CM

## 2018-04-17 HISTORY — DX: Ventricular premature depolarization: I49.3

## 2018-07-17 DIAGNOSIS — Y92009 Unspecified place in unspecified non-institutional (private) residence as the place of occurrence of the external cause: Secondary | ICD-10-CM | POA: Insufficient documentation

## 2018-07-17 DIAGNOSIS — W19XXXA Unspecified fall, initial encounter: Secondary | ICD-10-CM

## 2018-07-17 HISTORY — DX: Unspecified fall, initial encounter: W19.XXXA

## 2018-07-17 HISTORY — DX: Unspecified place in unspecified non-institutional (private) residence as the place of occurrence of the external cause: Y92.009

## 2018-08-24 DIAGNOSIS — M810 Age-related osteoporosis without current pathological fracture: Secondary | ICD-10-CM | POA: Insufficient documentation

## 2018-08-24 HISTORY — DX: Age-related osteoporosis without current pathological fracture: M81.0

## 2018-08-30 DIAGNOSIS — I270 Primary pulmonary hypertension: Secondary | ICD-10-CM

## 2018-08-30 DIAGNOSIS — I340 Nonrheumatic mitral (valve) insufficiency: Secondary | ICD-10-CM | POA: Diagnosis not present

## 2019-09-07 ENCOUNTER — Telehealth (HOSPITAL_COMMUNITY): Payer: Self-pay | Admitting: *Deleted

## 2019-09-07 NOTE — Telephone Encounter (Signed)
Called pt at [phone] to schedule RHC w/DB in January per [facility] pulm. Pt will also have to have covid test done within [facility] health system they pt can not have test performed outside of our health system.  Pt did not answer left VM for pt to call back.

## 2019-09-10 ENCOUNTER — Ambulatory Visit (INDEPENDENT_AMBULATORY_CARE_PROVIDER_SITE_OTHER): Payer: Medicare Other | Admitting: Cardiology

## 2019-09-10 ENCOUNTER — Other Ambulatory Visit: Payer: Self-pay

## 2019-09-10 ENCOUNTER — Encounter: Payer: Self-pay | Admitting: Cardiology

## 2019-09-10 ENCOUNTER — Other Ambulatory Visit (INDEPENDENT_AMBULATORY_CARE_PROVIDER_SITE_OTHER): Payer: Medicare Other

## 2019-09-10 DIAGNOSIS — R0600 Dyspnea, unspecified: Secondary | ICD-10-CM

## 2019-09-10 DIAGNOSIS — R0789 Other chest pain: Secondary | ICD-10-CM

## 2019-09-10 DIAGNOSIS — I340 Nonrheumatic mitral (valve) insufficiency: Secondary | ICD-10-CM | POA: Insufficient documentation

## 2019-09-10 DIAGNOSIS — I10 Essential (primary) hypertension: Secondary | ICD-10-CM

## 2019-09-10 DIAGNOSIS — R002 Palpitations: Secondary | ICD-10-CM

## 2019-09-10 DIAGNOSIS — R0609 Other forms of dyspnea: Secondary | ICD-10-CM

## 2019-09-10 HISTORY — DX: Dyspnea, unspecified: R06.00

## 2019-09-10 HISTORY — DX: Other chest pain: R07.89

## 2019-09-10 HISTORY — DX: Essential (primary) hypertension: I10

## 2019-09-10 HISTORY — DX: Other forms of dyspnea: R06.09

## 2019-09-10 HISTORY — DX: Nonrheumatic mitral (valve) insufficiency: I34.0

## 2019-09-10 HISTORY — DX: Palpitations: R00.2

## 2019-09-10 NOTE — Patient Instructions (Addendum)
Medication Instructions:  Your physician recommends that you continue on your current medications as directed. Please refer to the Current Medication list given to you today.  If you need a refill on your cardiac medications before your next appointment, please call your pharmacy.   Lab work: NONE If you have labs (blood work) drawn today and your tests are completely normal, you will receive your results only by: Marland Kitchen MyChart Message (if you have MyChart) OR . A paper copy in the mail If you have any lab test that is abnormal or we need to change your treatment, we will call you to review the results.  Testing/Procedures: You had an EKG performed today.  Your physician has recommended that you wear a ZIO monitor. ZIO monitors are medical devices that record the heart's electrical activity. Doctors most often use these monitors to diagnose arrhythmias. Arrhythmias are problems with the speed or rhythm of the heartbeat. The monitor is a small, portable device. You can wear one while you do your normal daily activities. This is usually used to diagnose what is causing palpitations/syncope (passing out).You will wear this device for 3 days   Your physician has requested that you have an echocardiogram. Echocardiography is a painless test that uses sound waves to create images of your heart. It provides your doctor with information about the size and shape of your heart and how well your heart's chambers and valves are working. This procedure takes approximately one hour. There are no restrictions for this procedure.  Your physician has requested that you have a lexiscan myoview. For further information please visit [email or web address]. Please follow instruction sheet, as given.    Follow-Up: At [facility], you and your health needs are our priority.  As part of our continuing mission to provide you with exceptional heart care, we have created designated Provider Care Teams.  These Care Teams  include your primary Cardiologist (physician) and Advanced Practice Providers (APPs -  Physician Assistants and Nurse Practitioners) who all work together to provide you with the care you need, when you need it. You will need a follow up appointment in 2 months.   Any Other Special Instructions Will Be Listed Below  Regadenoson injection What is this medicine? REGADENOSON is used to test the heart for coronary artery disease. It is used in patients who can not exercise for their stress test. This medicine may be used for other purposes; ask your health care provider or pharmacist if you have questions. COMMON BRAND NAME(S): Lexiscan What should I tell my health care provider before I take this medicine? They need to know if you have any of these conditions:  heart problems  lung or breathing disease, like asthma or COPD  an unusual or allergic reaction to regadenoson, other medicines, foods, dyes, or preservatives  pregnant or trying to get pregnant  breast-feeding How should I use this medicine? This medicine is for injection into a vein. It is given by a health care professional in a hospital or clinic setting. Talk to your pediatrician regarding the use of this medicine in children. Special care may be needed. Overdosage: If you think you have taken too much of this medicine contact a poison control center or emergency room at once. NOTE: This medicine is only for you. Do not share this medicine with others. What if I miss a dose? This does not apply. What may interact with this medicine?  caffeine  dipyridamole  guarana  theophylline This list may not describe all  possible interactions. Give your health care provider a list of all the medicines, herbs, non-prescription drugs, or dietary supplements you use. Also tell them if you smoke, drink alcohol, or use illegal drugs. Some items may interact with your medicine. What should I watch for while using this medicine? Your  condition will be monitored carefully while you are receiving this medicine. Do not take medicines, foods, or drinks with caffeine (like coffee, tea, or colas) for at least 12 hours before your test. If you do not know if something contains caffeine, ask your health care professional. What side effects may I notice from receiving this medicine? Side effects that you should report to your doctor or health care professional as soon as possible:  allergic reactions like skin rash, itching or hives, swelling of the face, lips, or tongue  breathing problems  chest pain, tightness or palpitations  severe headache Side effects that usually do not require medical attention (report to your doctor or health care professional if they continue or are bothersome):  flushing  headache  irritation or pain at site where injected  nausea, vomiting This list may not describe all possible side effects. Call your doctor for medical advice about side effects. You may report side effects to FDA at 1-800-FDA-1088. Where should I keep my medicine? This drug is given in a hospital or clinic and will not be stored at home. NOTE: This sheet is a summary. It may not cover all possible information. If you have questions about this medicine, talk to your doctor, pharmacist, or health care provider.  2020 Elsevier/Gold Standard (2008-05-06 15:08:13)  Cardiac Nuclear Scan A cardiac nuclear scan is a test that is done to check the flow of blood to your heart. It is done when you are resting and when you are exercising. The test looks for problems such as:  Not enough blood reaching a portion of the heart.  The heart muscle not working as it should. You may need this test if:  You have heart disease.  You have had lab results that are not normal.  You have had heart surgery or a balloon procedure to open up blocked arteries (angioplasty).  You have chest pain.  You have shortness of breath. In this test,  a special dye (tracer) is put into your bloodstream. The tracer will travel to your heart. A camera will then take pictures of your heart to see how the tracer moves through your heart. This test is usually done at a hospital and takes 2-4 hours. Tell a doctor about:  Any allergies you have.  All medicines you are taking, including vitamins, herbs, eye drops, creams, and over-the-counter medicines.  Any problems you or family members have had with anesthetic medicines.  Any blood disorders you have.  Any surgeries you have had.  Any medical conditions you have.  Whether you are pregnant or may be pregnant. What are the risks? Generally, this is a safe test. However, problems may occur, such as:  Serious chest pain and heart attack. This is only a risk if the stress portion of the test is done.  Rapid heartbeat.  A feeling of warmth in your chest. This feeling usually does not last long.  Allergic reaction to the tracer. What happens before the test?  Ask your doctor about changing or stopping your normal medicines. This is important.  Follow instructions from your doctor about what you cannot eat or drink.  Remove your jewelry on the day of the test.  What happens during the test?  An IV tube will be inserted into one of your veins.  Your doctor will give you a small amount of tracer through the IV tube.  You will wait for 20-40 minutes while the tracer moves through your bloodstream.  Your heart will be monitored with an electrocardiogram (ECG).  You will lie down on an exam table.  Pictures of your heart will be taken for about 15-20 minutes.  You may also have a stress test. For this test, one of these things may be done: ? You will be asked to exercise on a treadmill or a stationary bike. ? You will be given medicines that will make your heart work harder. This is done if you are unable to exercise.  When blood flow to your heart has peaked, a tracer will again  be given through the IV tube.  After 20-40 minutes, you will get back on the exam table. More pictures will be taken of your heart.  Depending on the tracer that is used, more pictures may need to be taken 3-4 hours later.  Your IV tube will be removed when the test is over. The test may vary among doctors and hospitals. What happens after the test?  Ask your doctor: ? Whether you can return to your normal schedule, including diet, activities, and medicines. ? Whether you should drink more fluids. This will help to remove the tracer from your body. Drink enough fluid to keep your pee (urine) pale yellow.  Ask your doctor, or the department that is doing the test: ? When will my results be ready? ? How will I get my results? Summary  A cardiac nuclear scan is a test that is done to check the flow of blood to your heart.  Tell your doctor whether you are pregnant or may be pregnant.  Before the test, ask your doctor about changing or stopping your normal medicines. This is important.  Ask your doctor whether you can return to your normal activities. You may be asked to drink more fluids. This information is not intended to replace advice given to you by your health care provider. Make sure you discuss any questions you have with your health care provider. Document Released: 02/20/2018 Document Revised: 12/27/2018 Document Reviewed: 02/20/2018 Elsevier Patient Education  [employer].  Echocardiogram An echocardiogram is a procedure that uses painless sound waves (ultrasound) to produce an image of the heart. Images from an echocardiogram can provide important information about:  Signs of coronary artery disease (CAD).  Aneurysm detection. An aneurysm is a weak or damaged part of an artery wall that bulges out from the normal force of blood pumping through the body.  Heart size and shape. Changes in the size or shape of the heart can be associated with certain conditions,  including heart failure, aneurysm, and CAD.  Heart muscle function.  Heart valve function.  Signs of a past heart attack.  Fluid buildup around the heart.  Thickening of the heart muscle.  A tumor or infectious growth around the heart valves. Tell a health care provider about:  Any allergies you have.  All medicines you are taking, including vitamins, herbs, eye drops, creams, and over-the-counter medicines.  Any blood disorders you have.  Any surgeries you have had.  Any medical conditions you have.  Whether you are pregnant or may be pregnant. What are the risks? Generally, this is a safe procedure. However, problems may occur, including:  Allergic reaction to  dye (contrast) that may be used during the procedure. What happens before the procedure? No specific preparation is needed. You may eat and drink normally. What happens during the procedure?   An IV tube may be inserted into one of your veins.  You may receive contrast through this tube. A contrast is an injection that improves the quality of the pictures from your heart.  A gel will be applied to your chest.  A wand-like tool (transducer) will be moved over your chest. The gel will help to transmit the sound waves from the transducer.  The sound waves will harmlessly bounce off of your heart to allow the heart images to be captured in real-time motion. The images will be recorded on a computer. The procedure may vary among health care providers and hospitals. What happens after the procedure?  You may return to your normal, everyday life, including diet, activities, and medicines, unless your health care provider tells you not to do that. Summary  An echocardiogram is a procedure that uses painless sound waves (ultrasound) to produce an image of the heart.  Images from an echocardiogram can provide important information about the size and shape of your heart, heart muscle function, heart valve function,  and fluid buildup around your heart.  You do not need to do anything to prepare before this procedure. You may eat and drink normally.  After the echocardiogram is completed, you may return to your normal, everyday life, unless your health care provider tells you not to do that. This information is not intended to replace advice given to you by your health care provider. Make sure you discuss any questions you have with your health care provider. Document Released: 09/03/2000 Document Revised: 12/28/2018 Document Reviewed: 10/09/2016 Elsevier Patient Education  2020 [employer].

## 2019-09-10 NOTE — Progress Notes (Signed)
Cardiology Office Note:    Date:  09/10/2019   ID:  [name], DOB 11-Jul-1932, MRN [id number]  PCP:  Everlean Cherry, MD  Cardiologist:  Garwin Brothers, MD   Referring MD: Everlean Cherry, MD    ASSESSMENT:    1. Essential hypertension   2. Mitral valve insufficiency, unspecified etiology   3. Palpitations   4. Dyspnea on exertion   5. Chest tightness    PLAN:    In order of problems listed above:  1. Chest tightness/dyspnea on exertion: Primary prevention stressed to the patient.  Importance of compliance with diet and medication stressed and she vocalized understanding.  To evaluate this she will undergo Lexiscan sestamibi. 2. Cardiac murmur: Echocardiogram will be done to assess murmur heard on auscultation.  This will also help review mitral regurgitation and pulmonary hypertension issues. 3. Moderate mitral regurgitation: As mentioned above 4. Palpitations: She will undergo 3-day monitoring to assess her palpitations 5. Patient will be seen in follow-up appointment in 2 months or earlier if the patient has any concerns 6. She knows to go to the nearest emergency room for any concerning symptoms.   Medication Adjustments/Labs and Tests Ordered: Current medicines are reviewed at length with the patient today.  Concerns regarding medicines are outlined above.  No orders of the defined types were placed in this encounter.  No orders of the defined types were placed in this encounter.    History of Present Illness:    [name] is a 85 y.o. female who is being seen today for the evaluation of dyspnea on exertion and chest tightness and palpitations at the request of Everlean Cherry, MD.  Patient is a pleasant 85 year old female.  She has past medical history of essential hypertension.  She mentions to me that occasionally she has palpitations.  She is concerned about it.  Her heart rate was 125 the other day she says.  She also complains of chest tightness on  times this may or may not be related to exertion.  No radiation of the symptoms to any part of the body.  At the time of my evaluation, the patient is alert awake oriented and in no distress.  She was also told of pulmonary hypertension in the past.  Past Medical History:  Diagnosis Date  . Thyroid disease   . UTI (urinary tract infection)   . Venous insufficiency     Past Surgical History:  Procedure Laterality Date  . BACK SURGERY    . JOINT REPLACEMENT     hip and knee    Current Medications: Current Meds  Medication Sig  . allopurinol (ZYLOPRIM) 100 MG tablet Take 100 mg by mouth daily.  . cholecalciferol (VITAMIN D) 1000 UNITS tablet Take 1,000 Units by mouth daily.  Marland Kitchen levothyroxine (SYNTHROID, LEVOTHROID) 100 MCG tablet Take 100 mcg by mouth daily.     Allergies:   Isosorbide nitrate, Cephalexin, Gentamicin, Penicillins, and Sulfa antibiotics   Social History   Socioeconomic History  . Marital status: Widowed    Spouse name: Not on file  . Number of children: Not on file  . Years of education: Not on file  . Highest education level: Not on file  Occupational History  . Not on file  Tobacco Use  . Smoking status: Never Smoker  . Smokeless tobacco: Never Used  Substance and Sexual Activity  . Alcohol use: No  . Drug use: No  . Sexual activity: Not on file  Other  Topics Concern  . Not on file  Social History Narrative  . Not on file   Social Determinants of Health   Financial Resource Strain:   . Difficulty of Paying Living Expenses: Not on file  Food Insecurity:   . Worried About Charity fundraiser in the Last Year: Not on file  . Ran Out of Food in the Last Year: Not on file  Transportation Needs:   . Lack of Transportation (Medical): Not on file  . Lack of Transportation (Non-Medical): Not on file  Physical Activity:   . Days of Exercise per Week: Not on file  . Minutes of Exercise per Session: Not on file  Stress:   . Feeling of Stress : Not on  file  Social Connections:   . Frequency of Communication with Friends and Family: Not on file  . Frequency of Social Gatherings with Friends and Family: Not on file  . Attends Religious Services: Not on file  . Active Member of Clubs or Organizations: Not on file  . Attends Archivist Meetings: Not on file  . Marital Status: Not on file     Family History: The patient's family history is not on file.  ROS:   Please see the history of present illness.    All other systems reviewed and are negative.  EKGs/Labs/Other Studies Reviewed:    The following studies were reviewed today: EKG reveals sinus rhythm and nonspecific ST-T changes echocardiogram done last year reveals moderate mitral regurgitation and pulmonary artery systolic pressure 45 mmHg.   Recent Labs: No results found for requested labs within last 8760 hours.  Recent Lipid Panel No results found for: CHOL, TRIG, HDL, CHOLHDL, VLDL, LDLCALC, LDLDIRECT  Physical Exam:    VS:  BP (!) 142/82 (BP Location: Right Arm, Patient Position: Sitting, Cuff Size: Normal)   Pulse 80   Ht 5\' 10"  (1.778 m)   Wt 172 lb (78 kg)   SpO2 99%   BMI 24.68 kg/m     Wt Readings from Last 3 Encounters:  09/10/19 172 lb (78 kg)     GEN: Patient is in no acute distress HEENT: Normal NECK: No JVD; No carotid bruits LYMPHATICS: No lymphadenopathy CARDIAC: S1 S2 regular, 2/6 systolic murmur at the apex. RESPIRATORY:  Clear to auscultation without rales, wheezing or rhonchi  ABDOMEN: Soft, non-tender, non-distended MUSCULOSKELETAL:  No edema; No deformity  SKIN: Warm and dry NEUROLOGIC:  Alert and oriented x 3 PSYCHIATRIC:  Normal affect    Signed, Jenean Lindau, MD  09/10/2019 3:59 PM    [facility]

## 2019-09-13 DIAGNOSIS — R002 Palpitations: Secondary | ICD-10-CM

## 2019-09-18 ENCOUNTER — Encounter (HOSPITAL_COMMUNITY): Payer: Medicare Other

## 2019-09-18 NOTE — Telephone Encounter (Signed)
Left VM to schedule RHC on 12/18 and no answer on 12/22. Called pt today. Pt is followed by [facility] heart care in [place] and had an office visit at 12/21 pts cardiologist does not think pt needs RHC  and will manage care at this time.

## 2019-09-19 ENCOUNTER — Telehealth: Payer: Self-pay | Admitting: Cardiology

## 2019-09-19 NOTE — Telephone Encounter (Signed)
Hey could one of you call and reschedule this lady's appointments? Thanks

## 2019-09-19 NOTE — Telephone Encounter (Signed)
Patient calling wanting to reschedule her echo and myocardial perfusion appointments 09/24/19 in [place] to [place]. She would like a call back today.

## 2019-09-20 ENCOUNTER — Telehealth (HOSPITAL_COMMUNITY): Payer: Self-pay | Admitting: *Deleted

## 2019-09-20 NOTE — Telephone Encounter (Signed)
Patient given detailed instructions per Myocardial Perfusion Study Information Sheet for the test on 11/13/1931 at 7:15. Patient notified to arrive 15 minutes early and that it is imperative to arrive on time for appointment to keep from having the test rescheduled.  If you need to cancel or reschedule your appointment, please call the office within 24 hours of your appointment. . Patient verbalized understanding.[name]

## 2019-09-24 ENCOUNTER — Ambulatory Visit (HOSPITAL_COMMUNITY): Payer: Medicare PPO

## 2019-10-02 ENCOUNTER — Encounter (HOSPITAL_COMMUNITY): Payer: Medicare PPO

## 2019-10-02 ENCOUNTER — Other Ambulatory Visit (HOSPITAL_COMMUNITY): Payer: Medicare PPO

## 2019-10-03 ENCOUNTER — Encounter: Payer: Self-pay | Admitting: *Deleted

## 2019-10-10 ENCOUNTER — Telehealth: Payer: Self-pay | Admitting: *Deleted

## 2019-10-10 NOTE — Telephone Encounter (Signed)
Patient given detailed instructions per Myocardial Perfusion Study Information Sheet for the test on 10/17/2019 at 0815. Patient notified to arrive 15 minutes early and that it is imperative to arrive on time for appointment to keep from having the test rescheduled.  If you need to cancel or reschedule your appointment, please call the office within 24 hours of your appointment. . Patient verbalized understanding.Steen Bisig, Adelene Idler Mychart letter sent to son

## 2019-10-17 ENCOUNTER — Other Ambulatory Visit: Payer: Self-pay

## 2019-10-17 ENCOUNTER — Ambulatory Visit (INDEPENDENT_AMBULATORY_CARE_PROVIDER_SITE_OTHER): Payer: Medicare PPO

## 2019-10-17 VITALS — Ht 70.0 in | Wt 170.0 lb

## 2019-10-17 DIAGNOSIS — I340 Nonrheumatic mitral (valve) insufficiency: Secondary | ICD-10-CM

## 2019-10-17 DIAGNOSIS — R0609 Other forms of dyspnea: Secondary | ICD-10-CM

## 2019-10-17 DIAGNOSIS — R0600 Dyspnea, unspecified: Secondary | ICD-10-CM

## 2019-10-17 DIAGNOSIS — R002 Palpitations: Secondary | ICD-10-CM | POA: Diagnosis not present

## 2019-10-17 DIAGNOSIS — R0789 Other chest pain: Secondary | ICD-10-CM

## 2019-10-17 LAB — MYOCARDIAL PERFUSION IMAGING
LV dias vol: 73 mL (ref 46–106)
LV sys vol: 26 mL
Peak HR: 91 {beats}/min
Rest HR: 81 {beats}/min
SDS: 7
SRS: 1
SSS: 8
TID: 1.3

## 2019-10-17 MED ORDER — TECHNETIUM TC 99M TETROFOSMIN IV KIT
32.3000 | PACK | Freq: Once | INTRAVENOUS | Status: AC | PRN
  Administered 2019-10-17: 32.3 via INTRAVENOUS

## 2019-10-17 MED ORDER — TECHNETIUM TC 99M TETROFOSMIN IV KIT
10.3000 | PACK | Freq: Once | INTRAVENOUS | Status: AC | PRN
  Administered 2019-10-17: 10.3 via INTRAVENOUS

## 2019-10-17 MED ORDER — REGADENOSON 0.4 MG/5ML IV SOLN
0.4000 mg | Freq: Once | INTRAVENOUS | Status: AC
  Administered 2019-10-17: 0.4 mg via INTRAVENOUS

## 2019-10-31 ENCOUNTER — Ambulatory Visit (INDEPENDENT_AMBULATORY_CARE_PROVIDER_SITE_OTHER): Payer: Medicare PPO

## 2019-10-31 ENCOUNTER — Other Ambulatory Visit: Payer: Self-pay

## 2019-10-31 DIAGNOSIS — R002 Palpitations: Secondary | ICD-10-CM | POA: Diagnosis not present

## 2019-10-31 DIAGNOSIS — I340 Nonrheumatic mitral (valve) insufficiency: Secondary | ICD-10-CM

## 2019-10-31 DIAGNOSIS — R0789 Other chest pain: Secondary | ICD-10-CM

## 2019-10-31 DIAGNOSIS — R0600 Dyspnea, unspecified: Secondary | ICD-10-CM | POA: Diagnosis not present

## 2019-10-31 DIAGNOSIS — I10 Essential (primary) hypertension: Secondary | ICD-10-CM | POA: Diagnosis not present

## 2019-10-31 NOTE — Progress Notes (Signed)
Complete echocardiogram has been performed.  Jimmy Andrya Roppolo RDCS, RVT 

## 2019-11-15 ENCOUNTER — Ambulatory Visit (INDEPENDENT_AMBULATORY_CARE_PROVIDER_SITE_OTHER): Payer: Medicare PPO | Admitting: Cardiology

## 2019-11-15 ENCOUNTER — Other Ambulatory Visit: Payer: Self-pay

## 2019-11-15 ENCOUNTER — Encounter: Payer: Self-pay | Admitting: Cardiology

## 2019-11-15 VITALS — BP 118/70 | HR 110 | Ht 70.0 in | Wt 171.0 lb

## 2019-11-15 DIAGNOSIS — I10 Essential (primary) hypertension: Secondary | ICD-10-CM | POA: Diagnosis not present

## 2019-11-15 DIAGNOSIS — E7800 Pure hypercholesterolemia, unspecified: Secondary | ICD-10-CM

## 2019-11-15 DIAGNOSIS — I340 Nonrheumatic mitral (valve) insufficiency: Secondary | ICD-10-CM

## 2019-11-15 DIAGNOSIS — R9439 Abnormal result of other cardiovascular function study: Secondary | ICD-10-CM | POA: Insufficient documentation

## 2019-11-15 HISTORY — DX: Abnormal result of other cardiovascular function study: R94.39

## 2019-11-15 MED ORDER — METOPROLOL SUCCINATE ER 50 MG PO TB24: 50.0000 mg | ORAL_TABLET | Freq: Every day | ORAL | 3 refills | Status: DC

## 2019-11-15 NOTE — Progress Notes (Signed)
Cardiology Office Note:    Date:  11/15/2019   ID:  [name], DOB [birth year], MRN [id number]  PCP:  Everlean Cherry, MD  Cardiologist:  Garwin Brothers, MD   Referring MD: Everlean Cherry, MD    ASSESSMENT:    1. Abnormal nuclear stress test   2. Essential hypertension   3. Mitral valve insufficiency, unspecified etiology   4. Hypercholesterolemia    PLAN:    In order of problems listed above:  1. Abnormal nuclear stress test: I discussed my findings with the patient at extensive length.  I gave her an option of invasive coronary angiography which is conventional versus CT coronary angiography and she prefers the latter.  Benefits and potential is explained and she vocalized understanding.  She has essential hypertension and is on telmisartan.  Her heart rate is elevated.  In view of this I will stop the telmisartan and initiate her on Toprol-XL 50 mg daily.  This will also help her heart rate control for CT scanning purposes.  I discussed this with her and she vocalized understanding. 2. Essential hypertension: As mentioned above.  Blood pressure stable 3. Mixed dyslipidemia: Diet was discussed this is managed by primary care physician.  She will be seen in follow-up appointment in a month or earlier if she has any concerns.  She knows to go to the nearest emergency room for any significant issues.   Medication Adjustments/Labs and Tests Ordered: Current medicines are reviewed at length with the patient today.  Concerns regarding medicines are outlined above.  Orders Placed This Encounter  Procedures  . CT CORONARY MORPH W/CTA COR W/SCORE W/CA W/CM &/OR WO/CM  . CT CORONARY FRACTIONAL FLOW RESERVE DATA PREP  . CT CORONARY FRACTIONAL FLOW RESERVE FLUID ANALYSIS  . Basic Metabolic Panel (BMET)   Meds ordered this encounter  Medications  . metoprolol succinate (TOPROL-XL) 50 MG 24 hr tablet    Sig: Take 1 tablet (50 mg total) by mouth daily. Take with or immediately  following a meal.    Dispense:  30 tablet    Refill:  3     Chief Complaint  Patient presents with  . Follow-up    2 Months     History of Present Illness:    [name] is a 86 y.o. female.  Patient has past medical history of essential hypertension and underwent stress testing.  She leads a sedentary lifestyle.  She ambulates with a cane.  Stress test is abnormal as mentioned below.  The complete report is mentioned below.  She denies any problems at this time.  Only when she exerts she has some shortness of breath.  At the time of my evaluation, the patient is alert awake oriented and in no distress.  Past Medical History:  Diagnosis Date  . Thyroid disease   . UTI (urinary tract infection)   . Venous insufficiency     Past Surgical History:  Procedure Laterality Date  . BACK SURGERY    . JOINT REPLACEMENT     hip and knee    Current Medications: Current Meds  Medication Sig  . allopurinol (ZYLOPRIM) 100 MG tablet Take 100 mg by mouth daily.  Marland Kitchen aspirin (ASPIRIN CHILDRENS) 81 MG chewable tablet Chew 81 mg by mouth at bedtime.  . calcium citrate-vitamin D (CITRACAL+D) 315-200 MG-UNIT tablet Take by mouth.  . cholecalciferol (VITAMIN D) 1000 UNITS tablet Take 1,000 Units by mouth daily.  Marland Kitchen CRANBERRY PO Take 2 capsules by mouth  daily.  . levothyroxine (SYNTHROID, LEVOTHROID) 100 MCG tablet Take 100 mcg by mouth daily.  . Multiple Vitamin (MULTIVITAMIN WITH MINERALS) TABS Take 1 tablet by mouth daily.  . naproxen sodium (ANAPROX) 220 MG tablet 220-440 mg by Per post-pyloric tube route daily as needed (pain).   . traMADol (ULTRAM) 50 MG tablet Take 1 tablet (50 mg total) by mouth every 6 (six) hours as needed for pain.  . vitamin B-12 (CYANOCOBALAMIN) 500 MCG tablet Take by mouth.  . [DISCONTINUED] telmisartan (MICARDIS) 20 MG tablet Take 20 mg by mouth daily.     Allergies:   Isosorbide nitrate, Cephalexin, Gentamicin, Penicillins, and Sulfa antibiotics   Social  History   Socioeconomic History  . Marital status: Widowed    Spouse name: Not on file  . Number of children: Not on file  . Years of education: Not on file  . Highest education level: Not on file  Occupational History  . Not on file  Tobacco Use  . Smoking status: Never Smoker  . Smokeless tobacco: Never Used  Substance and Sexual Activity  . Alcohol use: No  . Drug use: No  . Sexual activity: Not on file  Other Topics Concern  . Not on file  Social History Narrative  . Not on file   Social Determinants of Health   Financial Resource Strain:   . Difficulty of Paying Living Expenses: Not on file  Food Insecurity:   . Worried About Charity fundraiser in the Last Year: Not on file  . Ran Out of Food in the Last Year: Not on file  Transportation Needs:   . Lack of Transportation (Medical): Not on file  . Lack of Transportation (Non-Medical): Not on file  Physical Activity:   . Days of Exercise per Week: Not on file  . Minutes of Exercise per Session: Not on file  Stress:   . Feeling of Stress : Not on file  Social Connections:   . Frequency of Communication with Friends and Family: Not on file  . Frequency of Social Gatherings with Friends and Family: Not on file  . Attends Religious Services: Not on file  . Active Member of Clubs or Organizations: Not on file  . Attends Archivist Meetings: Not on file  . Marital Status: Not on file     Family History: The patient's family history is not on file.  ROS:   Please see the history of present illness.    All other systems reviewed and are negative.  EKGs/Labs/Other Studies Reviewed:    The following studies were reviewed today: IMPRESSIONS    1. Left ventricular ejection fraction, by estimation, is 65 to 70%. The  left ventricle has normal function. The left ventrical has no regional  wall motion abnormalities. There is mildly increased left ventricular  hypertrophy. Left ventricular diastolic   parameters are consistent with Grade II diastolic dysfunction  (pseudonormalization).  2. Left atrial size was mildly dilated. No left atrial/left atrial  appendage thrombus was detected.  3. The mitral valve is normal in structure and function. Mild mitral  valve regurgitation. No evidence of mitral stenosis.  4. Tricuspid valve regurgitation is mild to moderate.  5. The aortic valve is normal in structure and function. Aortic valve  regurgitation is trivial . No aortic stenosis is present.  6. The inferior vena cava is normal in size with greater than 50%  respiratory variability, suggesting right atrial pressure of 3 mmHg.   Study Highlights  The left ventricular ejection fraction is normal (55-65%).  Nuclear stress EF: 64%.  There was no ST segment deviation noted during stress.  No T wave inversion was noted during stress.  Defect 1: There is a small reversible defect of moderate severity present in the basal anterior and basal anteroseptal location.  Findings consistent with ischemia.  This is a high risk study.       EVENT MONITOR REPORT:   Patient was monitored from 09/10/2019 to 09/13/2019. Indication:                    Palpitations Ordering physician:  Garwin Brothers, MD  Referring physician:        Garwin Brothers, MD    Baseline rhythm: Sinus  Minimum heart rate: 49 BPM.  Average heart rate: 69 BPM.  Maximal heart rate 116 BPM.  Atrial arrhythmia: Rare brief atrial runs  Ventricular arrhythmia: None significant  Conduction abnormality: None significant  Symptoms: None significant   Conclusion:  Mildly abnormal but largely unremarkable event monitor.  Interpreting  cardiologist: Garwin Brothers, MD  Date: 09/28/2019 4:30 PM    Recent Labs: No results found for requested labs within last 8760 hours.  Recent Lipid Panel No results found for: CHOL, TRIG, HDL, CHOLHDL, VLDL, LDLCALC, LDLDIRECT  Physical Exam:    VS:   BP 118/70   Pulse (!) 110   Ht 5\' 10"  (1.778 m)   Wt 171 lb (77.6 kg)   SpO2 98%   BMI 24.54 kg/m     Wt Readings from Last 3 Encounters:  11/15/19 171 lb (77.6 kg)  10/17/19 170 lb (77.1 kg)  09/10/19 172 lb (78 kg)     GEN: Patient is in no acute distress HEENT: Normal NECK: No JVD; No carotid bruits LYMPHATICS: No lymphadenopathy CARDIAC: Hear sounds regular, 2/6 systolic murmur at the apex. RESPIRATORY:  Clear to auscultation without rales, wheezing or rhonchi  ABDOMEN: Soft, non-tender, non-distended MUSCULOSKELETAL:  No edema; No deformity  SKIN: Warm and dry NEUROLOGIC:  Alert and oriented x 3 PSYCHIATRIC:  Normal affect   Signed, 09/12/19, MD  11/15/2019 12:22 PM    [facility]

## 2019-11-15 NOTE — Patient Instructions (Signed)
Medication Instructions:  Your physician has recommended you make the following change in your medication:   Stop Micardis.  Start  Tropol XL 50 mg daily. *If you need a refill on your cardiac medications before your next appointment, please call your pharmacy*  Lab Work: You need to have a BMET 1 week before your CYT. If you have labs (blood work) drawn today and your tests are completely normal, you will receive your results only by: Marland Kitchen MyChart Message (if you have MyChart) OR . A paper copy in the mail If you have any lab test that is abnormal or we need to change your treatment, we will call you to review the results.  Testing/Procedures: Your physician has requested that you have cardiac CT. Cardiac computed tomography (CT) is a painless test that uses an x-ray machine to take clear, detailed pictures of your heart. For further information please visit [email or web address] Please follow instruction sheet as given.  Your cardiac CT will be scheduled at:   [facility] [address] [place], [place] [address] [phone]   [facility], please arrive at the [facility] main entrance of [facility] 30 minutes prior to test start time. Proceed to the [facility] Radiology Department (first floor) to check-in and test prep.    Please follow these instructions carefully (unless otherwise directed):   On the Night Before the Test: . Be sure to Drink plenty of water. . Do not consume any caffeinated/decaffeinated beverages or chocolate 12 hours prior to your test. . Do not take any antihistamines 12 hours prior to your test. . If you take Metformin do not take 24 hours prior to test.  On the Day of the Test: . Drink plenty of water. Do not drink any water within one hour of the test. . Do not eat any food 4 hours prior to the test. . You may take your regular medications prior to the test.  . Take metoprolol (Lopressor) two hours prior to  test. . FEMALES- please wear underwire-free bra if available        After the Test: . Drink plenty of water. . After receiving IV contrast, you may experience a mild flushed feeling. This is normal. . On occasion, you may experience a mild rash up to 24 hours after the test. This is not dangerous. If this occurs, you can take Benadryl 25 mg and increase your fluid intake. . If you experience trouble breathing, this can be serious. If it is severe call 911 IMMEDIATELY. If it is mild, please call our office. . If you take any of these medications: Glipizide/Metformin, Avandament, Glucavance, please do not take 48 hours after completing test unless otherwise instructed.   Once we have confirmed authorization from your insurance company, we will call you to set up a date and time for your test.   For non-scheduling related questions, please contact the cardiac imaging nurse navigator should you have any questions/concerns: Rockwell Alexandria, RN Navigator Cardiac Imaging [facility] [phone] mobile     Follow-Up: At [facility], you and your health needs are our priority.  As part of our continuing mission to provide you with exceptional heart care, we have created designated Provider Care Teams.  These Care Teams include your primary Cardiologist (physician) and Advanced Practice Providers (APPs -  Physician Assistants and Nurse Practitioners) who all work together to provide you with the care you need, when you need it.  Your next  appointment:   1 month(s)  The format for your next appointment:   In Person  Provider:   Jyl Heinz, MD  Other Instructions NA

## 2019-12-13 ENCOUNTER — Ambulatory Visit: Payer: Medicare PPO | Admitting: Cardiology

## 2019-12-24 ENCOUNTER — Telehealth (HOSPITAL_COMMUNITY): Payer: Self-pay | Admitting: Emergency Medicine

## 2019-12-24 NOTE — Telephone Encounter (Signed)
Reaching out to patient to offer assistance regarding upcoming cardiac imaging study; pt verbalizes understanding of appt date/time, parking situation and where to check in, pre-test NPO status and medications ordered, and verified current allergies; name and call back number provided for further questions should they arise Aymara Sassi RN Navigator Cardiac Imaging [facility] [phone] office [phone] cell 

## 2019-12-25 ENCOUNTER — Other Ambulatory Visit: Payer: Self-pay

## 2019-12-25 ENCOUNTER — Ambulatory Visit (HOSPITAL_COMMUNITY)
Admission: RE | Admit: 2019-12-25 | Discharge: 2019-12-25 | Disposition: A | Discharge status: Home / Self Care | Payer: Medicare PPO | Source: Ambulatory Visit | Attending: Cardiology | Admitting: Cardiology

## 2019-12-25 DIAGNOSIS — I340 Nonrheumatic mitral (valve) insufficiency: Secondary | ICD-10-CM | POA: Insufficient documentation

## 2019-12-25 LAB — POCT I-STAT CREATININE: Creatinine, Ser: 0.9 mg/dL (ref 0.44–1.00)

## 2019-12-25 MED ORDER — METOPROLOL TARTRATE 5 MG/5ML IV SOLN
INTRAVENOUS | Status: AC
  Filled 2019-12-25: qty 10

## 2019-12-25 MED ORDER — NITROGLYCERIN 0.4 MG SL SUBL
SUBLINGUAL_TABLET | SUBLINGUAL | Status: AC
  Filled 2019-12-25: qty 2

## 2019-12-25 MED ORDER — IOHEXOL 350 MG/ML SOLN
100.0000 mL | Freq: Once | INTRAVENOUS | Status: AC | PRN
  Administered 2019-12-25: 14:00:00 100 mL via INTRAVENOUS

## 2019-12-25 MED ORDER — METOPROLOL TARTRATE 5 MG/5ML IV SOLN
5.0000 mg | INTRAVENOUS | Status: DC | PRN
  Administered 2019-12-25: 5 mg via INTRAVENOUS

## 2019-12-25 MED ORDER — NITROGLYCERIN 0.4 MG SL SUBL
0.8000 mg | SUBLINGUAL_TABLET | Freq: Once | SUBLINGUAL | Status: AC
  Administered 2019-12-25: 0.8 mg via SUBLINGUAL

## 2019-12-25 NOTE — Progress Notes (Signed)
CT scan completed. Tolerated well. D/C home in wheelchair, awake and alert. In no distress 

## 2019-12-26 ENCOUNTER — Ambulatory Visit (HOSPITAL_COMMUNITY)
Admission: RE | Admit: 2019-12-26 | Discharge: 2019-12-26 | Disposition: A | Discharge status: Home / Self Care | Payer: Medicare PPO | Source: Ambulatory Visit | Attending: Cardiology | Admitting: Cardiology

## 2019-12-26 DIAGNOSIS — I340 Nonrheumatic mitral (valve) insufficiency: Secondary | ICD-10-CM | POA: Insufficient documentation

## 2019-12-27 DIAGNOSIS — I340 Nonrheumatic mitral (valve) insufficiency: Secondary | ICD-10-CM | POA: Diagnosis not present

## 2020-01-01 ENCOUNTER — Other Ambulatory Visit: Payer: Self-pay

## 2020-01-02 ENCOUNTER — Other Ambulatory Visit: Payer: Self-pay

## 2020-01-02 ENCOUNTER — Ambulatory Visit: Payer: Medicare PPO | Admitting: Cardiology

## 2020-01-02 ENCOUNTER — Encounter: Payer: Self-pay | Admitting: Cardiology

## 2020-01-02 VITALS — BP 100/70 | HR 96 | Temp 97.2°F | Ht 70.0 in | Wt 178.4 lb

## 2020-01-02 DIAGNOSIS — E7800 Pure hypercholesterolemia, unspecified: Secondary | ICD-10-CM

## 2020-01-02 DIAGNOSIS — M79604 Pain in right leg: Secondary | ICD-10-CM

## 2020-01-02 DIAGNOSIS — I10 Essential (primary) hypertension: Secondary | ICD-10-CM | POA: Diagnosis not present

## 2020-01-02 DIAGNOSIS — Z87891 Personal history of nicotine dependence: Secondary | ICD-10-CM | POA: Diagnosis not present

## 2020-01-02 DIAGNOSIS — I2510 Atherosclerotic heart disease of native coronary artery without angina pectoris: Secondary | ICD-10-CM

## 2020-01-02 DIAGNOSIS — M79605 Pain in left leg: Secondary | ICD-10-CM

## 2020-01-02 HISTORY — DX: Atherosclerotic heart disease of native coronary artery without angina pectoris: I25.10

## 2020-01-02 MED ORDER — CARVEDILOL 6.25 MG PO TABS: 6.2500 mg | ORAL_TABLET | Freq: Two times a day (BID) | ORAL | 6 refills | Status: DC

## 2020-01-02 NOTE — Addendum Note (Signed)
Addended by: Eleonore Chiquito on: 01/02/2020 03:02 PM   Modules accepted: Orders

## 2020-01-02 NOTE — Progress Notes (Signed)
Cardiology Office Note:    Date:  01/02/2020   ID:  Rennis Golden, DOB 23-Aug-1932, MRN [id number]  PCP:  Maris Berger, MD  Cardiologist:  Jenean Lindau, MD   Referring MD: Maris Berger, MD    ASSESSMENT:    1. Essential hypertension   2. Coronary artery disease involving native coronary artery of native heart without angina pectoris   3. Hypercholesterolemia   4. Former smoker    PLAN:    In order of problems listed above:  1. Coronary artery disease: Secondary prevention stressed with the patient.  Importance of compliance with diet and medication stressed and she vocalized understanding.  Coronary angiography report was discussed with the patient at extensive length.  She mentions to me that she has diarrhea from metoprolol so we will switch her to carvedilol 6.25 mg twice daily.  She will keep a track of her pulse and blood pressure and let us know. 2. Essential hypertension: Blood pressure is stable and we will monitor with the above changes. 3. Mixed dyslipidemia: In view of coronary artery disease will have blood work in the next few days which will a Chem-7 liver lipid check and initiated on statins.  She is agreeable to this. 4. Bilateral pedal edema left greater than right this has been going on for the past several months.  In view of this I just want to be sure and rule out DVT..  Many months ago she says she has had a DVT study which was negative. 5. Patient will be seen in follow-up appointment in 1 months or earlier if the patient has any concerns    Medication Adjustments/Labs and Tests Ordered: Current medicines are reviewed at length with the patient today.  Concerns regarding medicines are outlined above.  No orders of the defined types were placed in this encounter.  No orders of the defined types were placed in this encounter.    No chief complaint on file.    History of Present Illness:    [name] is a 86 y.o. female.  Patient was  evaluated by me and underwent CT coronary angiography with FFR.  Fortunately this has not revealed any significant obstructive coronary artery disease with a large amount of calcium burden.  She denies any chest pain orthopnea or PND.  At the time of my evaluation, the patient is alert awake oriented and in no distress.  Past Medical History:  Diagnosis Date   Abnormal nuclear stress test 11/15/2019   Abnormal perfusion scintigraphy 07/22/2014   Lexiscan MPS Jan 2012 with mild mis and apical anterior ischemia EF 59% she was seen at [facility] Jan 2012 with mild mis and apical anterior ischemia EF 59% she was seen at [facility] of this note might be different from the original. Overview:  Lexiscan MPS Jan 2012 with mild mis and apical anterior ischemia EF 59% she was seen at [facility] of this note might be different from   Abnormal results of function studies of other organs and systems 07/22/2014   Lexiscan MPS Jan 2012 with mild mis and apical anterior ischemia EF 59% she was seen at [facility] of this note might be different from the original. Overview:  Lexiscan MPS Jan 2012 with mild mis and apical anterior ischemia EF 59% she was seen at [facility]   Acute cystitis without hematuria 11/15/2014   Anxiety disorder 07/22/2014   B12 deficiency 07/22/2014   Benign hypertensive heart and kidney  disease with chronic kidney disease 07/22/2014   Chest tightness 09/10/2019   Chronic kidney disease, stage 2 (mild) 07/22/2014   Degeneration, intervertebral disc, cervical 07/22/2014   Depression, major, single episode, moderate (HCC) 07/22/2014   Drug rash 07/22/2014   Drug therapy 12/11/2015   Dyspnea on exertion 09/10/2019   Essential hypertension 09/10/2019   Fall at home 07/17/2018   Fatigue 07/22/2014   Former smoker 10/20/2017   Gout 07/22/2014   Gross hematuria 10/08/2014   Hypercholesterolemia 07/22/2014   Hypertonicity of bladder 07/22/2014   Hypothyroidism  (acquired) 07/22/2014   Qualifier: Diagnosis of  By: Everardo All MD, Cleophas Dunker  Formatting of this note might be different from the original. Overview:  Qualifier: Diagnosis of  By: Everardo All MD, Shaune Pascal, POST-RADIATION 06/11/2009   Qualifier: Diagnosis of  By: Everardo All MD, Gregary Signs A    Idiopathic chronic gout of multiple sites without tophus 12/11/2015   Idiopathic peripheral neuropathy 12/11/2015   Increased frequency of urination 07/22/2014   Left atrial enlargement 07/22/2014   mild  Formatting of this note might be different from the original. mild   Lethargy 07/22/2014   Low back pain 07/22/2014   Mitral regurgitation 09/10/2019   Obesity 07/22/2014   Onychomycosis due to dermatophyte 07/22/2014   Osteoarthritis, generalized 07/22/2014   Osteopenia 07/22/2014   Osteoporosis 08/24/2018   DEXA 08/23/2018  Formatting of this note might be different from the original. DEXA 08/23/2018   Palpitations 09/10/2019   Pulmonary hypertension (HCC) 12/08/2017   PVC (premature ventricular contraction) 04/17/2018   RBBB 10/20/2017   Recurrent UTI 12/11/2015   Sciatica 07/22/2014   Spinal stenosis of lumbar region 12/11/2015   Thyroid disease    TMJ arthralgia 07/22/2014   Unspecified abnormalities of gait and mobility 07/22/2014   Urge incontinence of urine 07/22/2014   Wears 8 extra absorbent pads during day an on overnight pad at night  Formatting of this note might be different from the original. Wears 8 extra absorbent pads during day an on overnight pad at night   URINARY INCONTINENCE 06/11/2009   Qualifier: Diagnosis of  By: Everardo All MD, Sean A    Urinary tract infection, site not specified 04/04/2014   UTI (urinary tract infection)    Varicose veins 07/22/2014   Venous insufficiency    Venous stasis dermatitis of both lower extremities 07/22/2014   Vitamin D deficiency 07/22/2014    Past Surgical History:  Procedure Laterality Date   BACK SURGERY     JOINT REPLACEMENT      hip and knee    Current Medications: Current Meds  Medication Sig   allopurinol (ZYLOPRIM) 100 MG tablet Take 100 mg by mouth daily.   cholecalciferol (VITAMIN D) 1000 UNITS tablet Take 1,000 Units by mouth daily.   levothyroxine (SYNTHROID, LEVOTHROID) 100 MCG tablet Take 100 mcg by mouth daily.   metoprolol succinate (TOPROL-XL) 50 MG 24 hr tablet Take 1 tablet (50 mg total) by mouth daily. Take with or immediately following a meal.   nitrofurantoin, macrocrystal-monohydrate, (MACROBID) 100 MG capsule Take 100 mg by mouth 2 (two) times daily with a meal. Pt has not started this medication yet but was recently prescribed.     Allergies:   Isosorbide nitrate, Cephalexin, Gentamicin, Penicillins, and Sulfa antibiotics   Social History   Socioeconomic History   Marital status: Widowed    Spouse name: Not on file   Number of children: Not on file   Years of education: Not on file  Highest education level: Not on file  Occupational History   Not on file  Tobacco Use   Smoking status: Never Smoker   Smokeless tobacco: Never Used  Substance and Sexual Activity   Alcohol use: No   Drug use: No   Sexual activity: Not on file  Other Topics Concern   Not on file  Social History Narrative   Not on file   Social Determinants of Health   Financial Resource Strain:    Difficulty of Paying Living Expenses:   Food Insecurity:    Worried About Programme researcher, broadcasting/film/video in the Last Year:    Barista in the Last Year:   Transportation Needs:    Freight forwarder (Medical):    Lack of Transportation (Non-Medical):   Physical Activity:    Days of Exercise per Week:    Minutes of Exercise per Session:   Stress:    Feeling of Stress :   Social Connections:    Frequency of Communication with Friends and Family:    Frequency of Social Gatherings with Friends and Family:    Attends Religious Services:    Active Member of Clubs or Organizations:     Attends Banker Meetings:    Marital Status:      Family History: The patient's family history is not on file.  ROS:   Please see the history of present illness.    All other systems reviewed and are negative.  EKGs/Labs/Other Studies Reviewed:    The following studies were reviewed today: IMPRESSION: 1. CT FFR flow analysis shows no hemodynamically significant flow limiting lesions.  Armanda Magic   Electronically Signed   By: Armanda Magic   On: 12/27/2019 16:52  IMPRESSION: 1. Coronary calcium score of 329. This was 65th percentile for age and sex matched control.  2.   Normal coronary origin with right dominance.  3.   Moderate atherosclerosis.  CAD-RADS 3.  4. Consider symptom-guided anti-ischemic and preventive pharmacotherapy as well as risk factor modification per guideline-directed care.  5.  This study has been submitted for FFR analysis.  Traci Turner   Recent Labs: 12/25/2019: Creatinine, Ser 0.90  Recent Lipid Panel No results found for: CHOL, TRIG, HDL, CHOLHDL, VLDL, LDLCALC, LDLDIRECT  Physical Exam:    VS:  BP 100/70    Pulse 96    Temp (!) 97.2 F (36.2 C)    Ht 5\' 10"  (1.778 m)    Wt 178 lb 6.4 oz (80.9 kg)    SpO2 96%    BMI 25.60 kg/m     Wt Readings from Last 3 Encounters:  01/02/20 178 lb 6.4 oz (80.9 kg)  11/15/19 171 lb (77.6 kg)  10/17/19 170 lb (77.1 kg)     GEN: Patient is in no acute distress HEENT: Normal NECK: No JVD; No carotid bruits LYMPHATICS: No lymphadenopathy CARDIAC: Hear sounds regular, 2/6 systolic murmur at the apex. RESPIRATORY:  Clear to auscultation without rales, wheezing or rhonchi  ABDOMEN: Soft, non-tender, non-distended MUSCULOSKELETAL: Left greater than the right, 2+ 3+ pedal edema; No deformity  SKIN: Warm and dry NEUROLOGIC:  Alert and oriented x 3 PSYCHIATRIC:  Normal affect   Signed, 10/19/19, MD  01/02/2020 2:40 PM    [facility]

## 2020-01-02 NOTE — Patient Instructions (Signed)
Medication Instructions:  Your physician has recommended you make the following change in your medication:  Start Coreg 6.25 mg twice daily.  *If you need a refill on your cardiac medications before your next appointment, please call your pharmacy*   Lab Work: Your physician recommends that you return for lab work in: the next few days. You need to have labs done when you are fasting.  You can come Monday through Friday 8:30 am to 12:00 pm and 1:15 to 4:30. You do not need to make an appointment as the order has already been placed. The labs you are going to have done are BMET, LFT and Lipids.   If you have labs (blood work) drawn today and your tests are completely normal, you will receive your results only by: Marland Kitchen MyChart Message (if you have MyChart) OR . A paper copy in the mail If you have any lab test that is abnormal or we need to change your treatment, we will call you to review the results.   Testing/Procedures: You have been ordered a doppler at [facility] for you leg pain.   Follow-Up: At [facility], you and your health needs are our priority.  As part of our continuing mission to provide you with exceptional heart care, we have created designated Provider Care Teams.  These Care Teams include your primary Cardiologist (physician) and Advanced Practice Providers (APPs -  Physician Assistants and Nurse Practitioners) who all work together to provide you with the care you need, when you need it.  We recommend signing up for the patient portal called "MyChart".  Sign up information is provided on this After Visit Summary.  MyChart is used to connect with patients for Virtual Visits (Telemedicine).  Patients are able to view lab/test results, encounter notes, upcoming appointments, etc.  Non-urgent messages can be sent to your provider as well.   To learn more about what you can do with MyChart, go to [email or web address].    Your next appointment:   1 month(s)  The  format for your next appointment:   In Person  Provider:   Belva Crome, MD   Other Instructions NA

## 2020-01-03 LAB — BASIC METABOLIC PANEL
BUN/Creatinine Ratio: 26 (ref 12–28)
BUN: 24 mg/dL (ref 8–27)
CO2: 25 mmol/L (ref 20–29)
Calcium: 9.4 mg/dL (ref 8.7–10.3)
Chloride: 102 mmol/L (ref 96–106)
Creatinine, Ser: 0.91 mg/dL (ref 0.57–1.00)
GFR calc Af Amer: 66 mL/min/{1.73_m2} (ref >59)
GFR calc non Af Amer: 57 mL/min/{1.73_m2} — ABNORMAL LOW (ref >59)
Glucose: 92 mg/dL (ref 65–99)
Potassium: 5.5 mmol/L — ABNORMAL HIGH (ref 3.5–5.2)
Sodium: 140 mmol/L (ref 134–144)

## 2020-01-03 LAB — HEPATIC FUNCTION PANEL
ALT: 10 IU/L (ref 0–32)
AST: 22 IU/L (ref 0–40)
Albumin: 3.7 g/dL (ref 3.6–4.6)
Alkaline Phosphatase: 115 IU/L (ref 39–117)
Bilirubin Total: 0.4 mg/dL (ref 0.0–1.2)
Bilirubin, Direct: 0.13 mg/dL (ref 0.00–0.40)
Total Protein: 7.1 g/dL (ref 6.0–8.5)

## 2020-01-03 LAB — LIPID PANEL
Chol/HDL Ratio: 2.4 ratio (ref 0.0–4.4)
Cholesterol, Total: 180 mg/dL (ref 100–199)
HDL: 74 mg/dL (ref >39)
LDL Chol Calc (NIH): 95 mg/dL (ref 0–99)
Triglycerides: 57 mg/dL (ref 0–149)
VLDL Cholesterol Cal: 11 mg/dL (ref 5–40)

## 2020-01-04 MED ORDER — ATORVASTATIN CALCIUM 10 MG PO TABS: 10.0000 mg | ORAL_TABLET | Freq: Every day | ORAL | 3 refills | Status: DC

## 2020-01-04 NOTE — Addendum Note (Signed)
Addended by: Eleonore Chiquito on: 01/04/2020 01:52 PM   Modules accepted: Orders

## 2020-01-09 ENCOUNTER — Telehealth: Payer: Self-pay | Admitting: Cardiology

## 2020-01-09 NOTE — Telephone Encounter (Signed)
Needs to talk to you about her labs and medication

## 2020-01-09 NOTE — Telephone Encounter (Signed)
Patient would like a copy of her lab results mailed to her. She would also like to know the name of the medication that was recently sent in to her pharmacy. She states she not picked it up yet.

## 2020-01-09 NOTE — Telephone Encounter (Signed)
Left a message on the patients voicemail letting her know what the medication was that was sent in to the pharmacy and I also let her know that I am putting a letter containing her lab work in the mail for her today.   Encouraged patient to call back with any questions or concerns.

## 2020-01-09 NOTE — Telephone Encounter (Signed)
Called pt and spoke with her about the statin and its use. I discussed her CT results and the need for the statin. Pt verbalized understanding and had no additional questions.

## 2020-02-01 ENCOUNTER — Ambulatory Visit: Payer: Medicare PPO | Admitting: Cardiology

## 2020-02-13 ENCOUNTER — Telehealth: Payer: Self-pay | Admitting: Cardiology

## 2020-02-13 NOTE — Telephone Encounter (Signed)
Called and spoke with the pt. Pt states that she has noticed her legs and feet have been swelling more since she started on Carvedilol. Pt states that she stopped taking the medication the past 2 days and that she has noticed her legs are improved. Pt states that her BP was 130/82 today at an appt. Pt does not do daily weights as I encouraged her to start to better see how much fluid she is gaining. Pt verbalized understanding and had no additional questions. How do you advise?

## 2020-02-13 NOTE — Telephone Encounter (Signed)
Called and spoke with the pt. Pt states that she has noticed her legs and feet have been swelling more since she started on Carvedilol. Pt states that she stopped taking the medication the past 2 days and that she has noticed her legs are improved. Pt states that her BP was 130/82 today at an appt. Pt does not do daily weights as I encouraged her to start to better see how much fluid she is gaining. Pt verbalized understanding and had no additional questions. How do you advise? 

## 2020-02-13 NOTE — Telephone Encounter (Signed)
Pt come in on Monday, called on Tuesday but was unable to get through, walked in again today. Complaint is that she started RX and feet and legs are swelling, wants to speak with nurse regarding this . Pt has appt on 0614/21.

## 2020-02-13 NOTE — Telephone Encounter (Signed)
Please tell her to weigh herself on a regular basis.  Hold carvedilol for now and get back to [place] in a week as to see how she is feeling.  Let me know when her next appointment is.

## 2020-02-13 NOTE — Telephone Encounter (Signed)
Patient has left, just call her on home #

## 2020-02-14 NOTE — Telephone Encounter (Signed)
Recommendations were given to the [name] as per Dr. Tomie China. [name] verbalized understanding and had no additional questions. [name] has an appointment on 03/03/20.

## 2020-02-14 NOTE — Telephone Encounter (Signed)
03/03/20 is her next appointment.

## 2020-03-03 ENCOUNTER — Ambulatory Visit: Payer: Medicare PPO | Admitting: Cardiology

## 2020-06-11 DIAGNOSIS — I361 Nonrheumatic tricuspid (valve) insufficiency: Secondary | ICD-10-CM

## 2020-06-11 DIAGNOSIS — I340 Nonrheumatic mitral (valve) insufficiency: Secondary | ICD-10-CM

## 2020-06-12 DIAGNOSIS — I4891 Unspecified atrial fibrillation: Secondary | ICD-10-CM | POA: Diagnosis not present

## 2020-06-12 DIAGNOSIS — I10 Essential (primary) hypertension: Secondary | ICD-10-CM

## 2020-06-12 DIAGNOSIS — R079 Chest pain, unspecified: Secondary | ICD-10-CM

## 2020-06-12 DIAGNOSIS — L0390 Cellulitis, unspecified: Secondary | ICD-10-CM | POA: Diagnosis not present

## 2020-06-13 DIAGNOSIS — I10 Essential (primary) hypertension: Secondary | ICD-10-CM | POA: Diagnosis not present

## 2020-06-13 DIAGNOSIS — R079 Chest pain, unspecified: Secondary | ICD-10-CM | POA: Diagnosis not present

## 2020-06-13 DIAGNOSIS — L0390 Cellulitis, unspecified: Secondary | ICD-10-CM | POA: Diagnosis not present

## 2020-06-13 DIAGNOSIS — I4891 Unspecified atrial fibrillation: Secondary | ICD-10-CM | POA: Diagnosis not present

## 2020-06-17 ENCOUNTER — Other Ambulatory Visit: Payer: Self-pay

## 2020-06-17 ENCOUNTER — Telehealth: Payer: Self-pay | Admitting: Cardiology

## 2020-06-17 DIAGNOSIS — E039 Hypothyroidism, unspecified: Secondary | ICD-10-CM | POA: Insufficient documentation

## 2020-06-17 NOTE — Telephone Encounter (Signed)
Pt states that she needs an appt asap. Told patient nothing was available for this week. Wants to see if nurse could help

## 2020-06-17 NOTE — Telephone Encounter (Signed)
Called and spoke with patient who reports that she was in the hospital recently. Pt reports taht she has increased leg swelling. Appointment made for pt with Dr. Servando Salina 06/18/20.

## 2020-06-18 ENCOUNTER — Encounter: Payer: Self-pay | Admitting: Cardiology

## 2020-06-18 ENCOUNTER — Ambulatory Visit: Payer: Medicare PPO | Admitting: Cardiology

## 2020-06-18 ENCOUNTER — Other Ambulatory Visit: Payer: Self-pay

## 2020-06-18 VITALS — BP 134/80 | HR 129 | Ht 70.0 in | Wt 186.6 lb

## 2020-06-18 DIAGNOSIS — I10 Essential (primary) hypertension: Secondary | ICD-10-CM

## 2020-06-18 DIAGNOSIS — I071 Rheumatic tricuspid insufficiency: Secondary | ICD-10-CM

## 2020-06-18 DIAGNOSIS — I4891 Unspecified atrial fibrillation: Secondary | ICD-10-CM | POA: Insufficient documentation

## 2020-06-18 DIAGNOSIS — I2510 Atherosclerotic heart disease of native coronary artery without angina pectoris: Secondary | ICD-10-CM

## 2020-06-18 DIAGNOSIS — I4819 Other persistent atrial fibrillation: Secondary | ICD-10-CM | POA: Diagnosis not present

## 2020-06-18 DIAGNOSIS — I340 Nonrheumatic mitral (valve) insufficiency: Secondary | ICD-10-CM | POA: Diagnosis not present

## 2020-06-18 DIAGNOSIS — I272 Pulmonary hypertension, unspecified: Secondary | ICD-10-CM | POA: Diagnosis not present

## 2020-06-18 HISTORY — DX: Unspecified atrial fibrillation: I48.91

## 2020-06-18 HISTORY — DX: Rheumatic tricuspid insufficiency: I07.1

## 2020-06-18 MED ORDER — ATORVASTATIN CALCIUM 10 MG PO TABS: 10.0000 mg | ORAL_TABLET | Freq: Every day | ORAL | 3 refills | Status: DC

## 2020-06-18 MED ORDER — FUROSEMIDE 20 MG PO TABS: 20.0000 mg | ORAL_TABLET | Freq: Every day | ORAL | 3 refills | Status: DC

## 2020-06-18 MED ORDER — POTASSIUM CHLORIDE CRYS ER 20 MEQ PO TBCR: 20.0000 meq | EXTENDED_RELEASE_TABLET | Freq: Every day | ORAL | 3 refills | Status: DC

## 2020-06-18 MED ORDER — DILTIAZEM HCL ER COATED BEADS 180 MG PO CP24: 180.0000 mg | ORAL_CAPSULE | Freq: Every day | ORAL | 3 refills | Status: DC

## 2020-06-18 NOTE — Progress Notes (Signed)
Cardiology Office Note:    Date:  06/18/2020   ID:  [name], DOB October 04, 1931, MRN [id number]  PCP:  Maris Berger, MD  Cardiologist:  Berniece Salines, DO  Electrophysiologist:  None   Referring MD: Maris Berger, MD  Hospital follow-up.  History of Present Illness:    [name] is a 86 y.o. female with a hx of coronary artery disease seen on coronary CTA at which time the patient was started on aspirin and statin, recently diagnosed atrial fibrillation, moderate tricuspid regurgitation, mild mitral regurgitation presents today for hospital follow-up.  The patient tells me that she presented to [facility] about a week ago as due to cat bite while in the hospital she was noted to have atrial fibrillation with rapid ventricular rate.  During hospitalization she was started on Cardizem and Eliquis.  While in the hospital echocardiogram was repeated which showed normal ejection fraction with worsening tricuspid vegetation which is moderate and mild to moderate mitral regurgitation.  She is here today with her former daughter-in-law for support.  She tells me she has been experiencing significant shortness of breath on exertion.  But denies any chest pain.  Past Medical History:  Diagnosis Date  . Abnormal nuclear stress test 11/15/2019  . Abnormal perfusion scintigraphy 07/22/2014   Lexiscan MPS Jan 2012 with mild mis and apical anterior ischemia EF 59% she was seen at [place] Jan 2012 with mild mis and apical anterior ischemia EF 59% she was seen at [facility] of this note might be different from the original. Overview:  Lexiscan MPS Jan 2012 with mild mis and apical anterior ischemia EF 59% she was seen at [facility] of this note might be different from  . Abnormal results of function studies of other organs and systems 07/22/2014   Lexiscan MPS Jan 2012 with mild mis and apical anterior ischemia EF 59% she was seen at [facility] of this note might be  different from the original. Overview:  Lexiscan MPS Jan 2012 with mild mis and apical anterior ischemia EF 59% she was seen at [facility]  . Acute cystitis without hematuria 11/15/2014  . Anxiety disorder 07/22/2014  . B12 deficiency 07/22/2014  . Benign hypertensive heart and kidney disease with chronic kidney disease 07/22/2014  . CAD (coronary artery disease) 01/02/2020  . Chest tightness 09/10/2019  . Chronic kidney disease, stage 2 (mild) 07/22/2014  . Degeneration, intervertebral disc, cervical 07/22/2014  . Depression, major, single episode, moderate ([place]) 07/22/2014  . Drug rash 07/22/2014  . Drug therapy 12/11/2015  . Dyspnea on exertion 09/10/2019  . Essential hypertension 09/10/2019  . Fall at home 07/17/2018  . Fatigue 07/22/2014  . Former smoker 10/20/2017  . Gout 07/22/2014  . Gross hematuria 10/08/2014  . Hypercholesterolemia 07/22/2014  . Hypertonicity of bladder 07/22/2014  . Hypothyroidism (acquired) 07/22/2014   Qualifier: Diagnosis of  By: Loanne Drilling MD, Jacelyn Pi  Formatting of this note might be different from the original. Overview:  Qualifier: Diagnosis of  By: Loanne Drilling MD, Jacelyn Pi HYPOTHYROIDISM, POST-RADIATION 06/11/2009   Qualifier: Diagnosis of  By: Loanne Drilling MD, Jacelyn Pi   . Idiopathic chronic gout of multiple sites without tophus 12/11/2015  . Idiopathic peripheral neuropathy 12/11/2015  . Increased frequency of urination 07/22/2014  . Left atrial enlargement 07/22/2014   mild  Formatting of this note might be different from the original. mild  . Lethargy 07/22/2014  . Low back pain 07/22/2014  . Mitral  regurgitation 09/10/2019  . Obesity 07/22/2014  . Onychomycosis due to dermatophyte 07/22/2014  . Osteoarthritis, generalized 07/22/2014  . Osteopenia 07/22/2014  . Osteoporosis 08/24/2018   DEXA 08/23/2018  Formatting of this note might be different from the original. DEXA 08/23/2018  . Palpitations 09/10/2019  . Pulmonary hypertension ([place]) 12/08/2017  . PVC (premature ventricular  contraction) 04/17/2018  . RBBB 10/20/2017  . Recurrent UTI 12/11/2015  . Sciatica 07/22/2014  . Spinal stenosis of lumbar region 12/11/2015  . Thyroid disease   . TMJ arthralgia 07/22/2014  . Unspecified abnormalities of gait and mobility 07/22/2014  . Urge incontinence of urine 07/22/2014   Wears 8 extra absorbent pads during day an on overnight pad at night  Formatting of this note might be different from the original. Wears 8 extra absorbent pads during day an on overnight pad at night  . URINARY INCONTINENCE 06/11/2009   Qualifier: Diagnosis of  By: Loanne Drilling MD, Sean A   . Urinary tract infection, site not specified 04/04/2014  . UTI (urinary tract infection)   . Varicose veins 07/22/2014  . Venous insufficiency   . Venous stasis dermatitis of both lower extremities 07/22/2014  . Vitamin D deficiency 07/22/2014    Past Surgical History:  Procedure Laterality Date  . BACK SURGERY    . JOINT REPLACEMENT     hip and knee    Current Medications: Current Meds  Medication Sig  . allopurinol (ZYLOPRIM) 100 MG tablet Take 100 mg by mouth daily.  . carvedilol (COREG) 6.25 MG tablet Take 1 tablet (6.25 mg total) by mouth 2 (two) times daily with a meal.  . cholecalciferol (VITAMIN D) 1000 UNITS tablet Take 1,000 Units by mouth daily.  . colchicine 0.6 MG tablet Take 0.6 mg by mouth daily.  Marland Kitchen diltiazem (CARDIZEM CD) 180 MG 24 hr capsule Take 1 capsule (180 mg total) by mouth daily.  Marland Kitchen ELIQUIS 5 MG TABS tablet Take 5 mg by mouth 2 (two) times daily.  Marland Kitchen levothyroxine (SYNTHROID, LEVOTHROID) 100 MCG tablet Take 100 mcg by mouth daily.  Marland Kitchen telmisartan (MICARDIS) 20 MG tablet Take 20 mg by mouth daily.   . vitamin B-12 (CYANOCOBALAMIN) 1000 MCG tablet Take 1,000 mcg by mouth daily.  . [DISCONTINUED] diltiazem (CARDIZEM CD) 120 MG 24 hr capsule Take 180 mg by mouth daily.     Allergies:   Isosorbide nitrate, Cephalexin, Gentamicin, Penicillins, and Sulfa antibiotics   Social History    Socioeconomic History  . Marital status: Widowed    Spouse name: Not on file  . Number of children: Not on file  . Years of education: Not on file  . Highest education level: Not on file  Occupational History  . Not on file  Tobacco Use  . Smoking status: Never Smoker  . Smokeless tobacco: Never Used  Substance and Sexual Activity  . Alcohol use: No  . Drug use: No  . Sexual activity: Not on file  Other Topics Concern  . Not on file  Social History Narrative  . Not on file   Social Determinants of Health   Financial Resource Strain:   . Difficulty of Paying Living Expenses: Not on file  Food Insecurity:   . Worried About Charity fundraiser in the Last Year: Not on file  . Ran Out of Food in the Last Year: Not on file  Transportation Needs:   . Lack of Transportation (Medical): Not on file  . Lack of Transportation (Non-Medical): Not on file  Physical  Activity:   . Days of Exercise per Week: Not on file  . Minutes of Exercise per Session: Not on file  Stress:   . Feeling of Stress : Not on file  Social Connections:   . Frequency of Communication with Friends and Family: Not on file  . Frequency of Social Gatherings with Friends and Family: Not on file  . Attends Religious Services: Not on file  . Active Member of Clubs or Organizations: Not on file  . Attends Archivist Meetings: Not on file  . Marital Status: Not on file     Family History: The patient's family history includes Cancer in her mother; Emphysema in her brother; Heart attack in her brother; Heart disease in her brother; Hypertension in her mother; Prostate cancer in her father.  ROS:   Review of Systems  Constitution: Negative for decreased appetite, fever and weight gain.  HENT: Negative for congestion, ear discharge, hoarse voice and sore throat.   Eyes: Negative for discharge, redness, vision loss in right eye and visual halos.  Cardiovascular: Negative for chest pain, dyspnea on  exertion, leg swelling, orthopnea and palpitations.  Respiratory: Negative for cough, hemoptysis, shortness of breath and snoring.   Endocrine: Negative for heat intolerance and polyphagia.  Hematologic/Lymphatic: Negative for bleeding problem. Does not bruise/bleed easily.  Skin: Negative for flushing, nail changes, rash and suspicious lesions.  Musculoskeletal: Negative for arthritis, joint pain, muscle cramps, myalgias, neck pain and stiffness.  Gastrointestinal: Negative for abdominal pain, bowel incontinence, diarrhea and excessive appetite.  Genitourinary: Negative for decreased libido, genital sores and incomplete emptying.  Neurological: Negative for brief paralysis, focal weakness, headaches and loss of balance.  Psychiatric/Behavioral: Negative for altered mental status, depression and suicidal ideas.  Allergic/Immunologic: Negative for HIV exposure and persistent infections.    EKGs/Labs/Other Studies Reviewed:    The following studies were reviewed today:   EKG:  The ekg ordered today demonstrates atrial fibrillation with rapid ventricular rate, heart rate 129 bpm, underlying right bundle branch block and left posterior fascicular block.  Compared to prior EKG done in December 20 no significant change.  Echocardiogram done on June 12, 2020 shows normal LV function 55 to 60%.  Reference goes mildly enlarged.  Left atrium is mildly dilated by volume.  Right atrium is moderately enlarged.  Mild to moderate mitral regurgitation.  Mild mitral valve prolapse noted.  Moderate tricuspid regurgitation.  Mild pulmonary hypertension 42 mmHg.  CCTA  Aorta: Normal size. Scattered calcifcations in the ascending and descending aorta. No dissection.  Aortic Valve:  Trileaflet.  No calcifications.  Coronary Arteries:  Normal coronary origin.  Right dominance.  RCA is a large dominant artery that gives rise to PDA and PLVB. There is mild calcified plaque in the proximal RCA with  associated stenosis of 25-49%.  Left main is a large artery that gives rise to LAD and LCX arteries. There is no plaque.  LAD is a large vessel that gives rise to a small D1 and D2. There is mild calcified plaque in the proximal LAD with associated stenosis of 25-49% and moderate calcified plaque in the mid LAD with associated stenosis of 50-69%.  LCX is a non-dominant artery that gives rise to one small OM1 branch. There is mild calcified plaque in the ostial and mid LCx with associated stenosis of 25-49%.  Other findings:  Normal pulmonary vein drainage into the left atrium.  Normal let atrial appendage without a thrombus.  Normal size of the pulmonary artery.  IMPRESSION: 1. Coronary calcium score of 329. This was 65th percentile for age and sex matched control.  2.   Normal coronary origin with right dominance.  3.   Moderate atherosclerosis.  CAD-RADS 3.  4. Consider symptom-guided anti-ischemic and preventive pharmacotherapy as well as risk factor modification per guideline-directed care.  5.  This study has been submitted for FFR analysis.  FFRct 1. Left Main: No significant stenosis.  FFR = 0.97.  2. LAD: No significant stenosis. Proximal FFR = 0.96, Mid FFR = 0.92, Distal FFR = 0.89.  3. LCX: No significant stenosis. Proximal FFR = 0.98, Mid FFR = 0.95, Distal FFR = 0.92.  4. RCA: No significant stenosis. Proximal FFR = 0.98, Mid FFR = 0.97, Distal FFR = 0.92.  IMPRESSION: 1. CT FFR flow analysis shows no hemodynamically significant flow limiting lesions.    Recent Labs: 01/03/2020: ALT 10; BUN 24; Creatinine, Ser 0.91; Potassium 5.5; Sodium 140  Recent Lipid Panel    Component Value Date/Time   CHOL 180 01/03/2020 0911   TRIG 57 01/03/2020 0911   HDL 74 01/03/2020 0911   CHOLHDL 2.4 01/03/2020 0911   LDLCALC 95 01/03/2020 0911    Physical Exam:    VS:  BP 134/80   Pulse (!) 129   Ht 5' 10"  (1.778 m)   Wt 186 lb 9.6 oz  (84.6 kg)   SpO2 99%   BMI 26.77 kg/m     Wt Readings from Last 3 Encounters:  06/18/20 186 lb 9.6 oz (84.6 kg)  01/02/20 178 lb 6.4 oz (80.9 kg)  11/15/19 171 lb (77.6 kg)     GEN: Well nourished, well developed in no acute distress HEENT: Normal NECK: No JVD; No carotid bruits LYMPHATICS: No lymphadenopathy CARDIAC: S1S2 noted,RRR, no murmurs, rubs, gallops RESPIRATORY:  Clear to auscultation without rales, wheezing or rhonchi  ABDOMEN: Soft, non-tender, non-distended, +bowel sounds, no guarding. EXTREMITIES: No edema, No cyanosis, no clubbing MUSCULOSKELETAL:  No deformity  SKIN: Warm and dry NEUROLOGIC:  Alert and oriented x 3, non-focal PSYCHIATRIC:  Normal affect, good insight  ASSESSMENT:    1. Persistent atrial fibrillation ([place])   2. Nonrheumatic mitral valve regurgitation   3. Essential hypertension   4. Mild Pulmonary hypertension ([place])   5. Coronary artery disease involving native coronary artery of native heart without angina pectoris   6. Moderate tricuspid regurgitation    PLAN:     1.  She is in atrial fibrillation with rapid ventricular rate today.  She is on Cardizem 120 mg IV can increase this to 180 mg daily.  She is on Eliquis 5 mg twice daily she will continue this as well.  We discussed rhythm strategy given the patient is short of breath and fatigue at home hopefully this will be able to help.  The plan is to stay on her anticoagulation for the next 4 weeks.  She will follow up hopefully with the increasing rate control once we slow her down should be able to convert.  If not the plan will be DC cardioversion.  2 she also has significant bilateral leg edema in the setting of her shortness of breath with moderate tricuspid regurgitation in mitral regurgitation I like to start the patient on low-dose diuretics.  Lasix 20 mg daily with potassium supplement has been started.  3.  She has coronary artery disease she has been started previously on Lipitor.   Her aspirin was stopped in the hospital due to the start of anticoagulant.  I  discussed with the patient once we are able to make sure her hemoglobin is stable she might need to restart her aspirin for her coronary artery disease.  But for now continue with the Lipitor 10 mg daily.  All of her questions has been answered during this visit. She will get blood work today for [employer] as well as CBC.  The patient is in agreement with the above plan. The patient left the office in stable condition.  The patient will follow up in   Medication Adjustments/Labs and Tests Ordered: Current medicines are reviewed at length with the patient today.  Concerns regarding medicines are outlined above.  Orders Placed This Encounter  Procedures  . Basic metabolic panel  . CBC with Differential/Platelet  . Magnesium  . EKG 12-Lead   Meds ordered this encounter  Medications  . atorvastatin (LIPITOR) 10 MG tablet    Sig: Take 1 tablet (10 mg total) by mouth daily.    Dispense:  30 tablet    Refill:  3  . diltiazem (CARDIZEM CD) 180 MG 24 hr capsule    Sig: Take 1 capsule (180 mg total) by mouth daily.    Dispense:  30 capsule    Refill:  3  . furosemide (LASIX) 20 MG tablet    Sig: Take 1 tablet (20 mg total) by mouth daily.    Dispense:  30 tablet    Refill:  3  . potassium chloride SA (KLOR-CON) 20 MEQ tablet    Sig: Take 1 tablet (20 mEq total) by mouth daily.    Dispense:  30 tablet    Refill:  3    Patient Instructions  Medication Instructions:  Your physician has recommended you make the following change in your medication:   Increase your Diltiazem to 180 mg daily. Start Lasix 20 mg daily. Start KCL 20 mEq daily. You must take your Lipitor 10 mg daily.  *If you need a refill on your cardiac medications before your next appointment, please call your pharmacy*   Lab Work: Your physician recommends that you have labs done in the office today. Your test included  basic metabolic panel,  complete blood count and magnesium.  If you have labs (blood work) drawn today and your tests are completely normal, you will receive your results only by: Marland Kitchen MyChart Message (if you have MyChart) OR . A paper copy in the mail If you have any lab test that is abnormal or we need to change your treatment, we will call you to review the results.   Testing/Procedures: None ordered   Follow-Up: At [facility], you and your health needs are our priority.  As part of our continuing mission to provide you with exceptional heart care, we have created designated Provider Care Teams.  These Care Teams include your primary Cardiologist (physician) and Advanced Practice Providers (APPs -  Physician Assistants and Nurse Practitioners) who all work together to provide you with the care you need, when you need it.  We recommend signing up for the patient portal called "MyChart".  Sign up information is provided on this After Visit Summary.  MyChart is used to connect with patients for Virtual Visits (Telemedicine).  Patients are able to view lab/test results, encounter notes, upcoming appointments, etc.  Non-urgent messages can be sent to your provider as well.   To learn more about what you can do with MyChart, go to [email or web address].    Your next appointment:   4-6  week(s)  The format  for your next appointment:   In Person  Provider:   Berniece Salines, DO   Other Instructions Potassium Chloride Extended-Release Capsules What is this medicine? POTASSIUM CHLORIDE (poe TASS i um KLOOR ide) is a potassium supplement used to prevent and to treat low potassium. Potassium is important for the heart, muscles, and nerves. Too much or too little potassium in the body can cause serious problems. This medicine may be used for other purposes; ask your health care provider or pharmacist if you have questions. COMMON BRAND NAME(S): Klor-Con, Micro-K, Micro-K Extencaps What should I tell my health care  provider before I take this medicine? They need to know if you have any of these conditions:  Addison disease  dehydration  diabetes, high blood sugar  difficulty swallowing  heart disease  high levels of potassium in the blood  irregular heartbeat or rhythm  kidney disease  large areas of burned skin  stomach ulcers, other stomach or intestine problems  an unusual or allergic reaction to potassium, other medicines, foods, dyes, or preservatives  pregnant or trying to get pregnant  breast-feeding How should I use this medicine? Take this drug by mouth with a glass of water. Take it as directed on the prescription label at the same time every day. Take it with food. Do not cut, crush, chew, or suck this drug. Swallow the capsules whole. You may open the capsule and put the contents in a teaspoon of soft food, such as applesauce or pudding. Do not add to hot foods. Swallow the mixture right away. Do not chew the mixture. Drink a glass of water or juice after taking the mixture. Keep taking this medicine unless your health care provider tells you to stop. Talk to your health care provider about the use of this drug in children. Special care may be needed. Overdosage: If you think you have taken too much of this medicine contact a poison control center or emergency room at once. NOTE: This medicine is only for you. Do not share this medicine with others. What if I miss a dose? If you miss a dose, take it as soon as you can. If it is almost time for your next dose, take only that dose. Do not take double or extra doses. What may interact with this medicine? Do not take this medicine with any of the following medications:  certain diuretics such as spironolactone, triamterene  certain medicines for stomach problems like atropine; difenoxin and glycopyrrolate  eplerenone  sodium polystyrene sulfonate This medicine may also interact with the following medications:  certain  medicines for blood pressure or heart disease like lisinopril, losartan, quinapril, valsartan  medicines that lower your chance of fighting infection such as cyclosporine, tacrolimus  NSAIDs, medicines for pain and inflammation, like ibuprofen or naproxen  other potassium supplements  salt substitutes This list may not describe all possible interactions. Give your health care provider a list of all the medicines, herbs, non-prescription drugs, or dietary supplements you use. Also tell them if you smoke, drink alcohol, or use illegal drugs. Some items may interact with your medicine. What should I watch for while using this medicine? Visit your doctor or health care professional for regular check ups. You will need lab work done regularly. You may need to be on a special diet while taking this medicine. Ask your doctor. What side effects may I notice from receiving this medicine? Side effects that you should report to your doctor or health care professional as soon as  possible:  allergic reactions like skin rash, itching or hives, swelling of the face, lips, or tongue  black, tarry stools  breathing problems  confusion  heartburn  fast, irregular heartbeat  feeling faint or lightheaded, falls  low blood pressure  numbness or tingling in hands or feet  pain when swallowing  unusually weak or tired  weakness, heaviness of legs Side effects that usually do not require medical attention (report to your doctor or health care professional if they continue or are bothersome):  diarrhea  nausea, vomiting  stomach pain This list may not describe all possible side effects. Call your doctor for medical advice about side effects. You may report side effects to FDA at 1-800-FDA-1088. Where should I keep my medicine? Keep out of the reach of children. Store at room temperature between 15 and 30 degrees C (59 and 86 degrees F ). Keep bottle closed tightly to protect this medicine  from light and moisture. Throw away any unused medicine after the expiration date. NOTE: This sheet is a summary. It may not cover all possible information. If you have questions about this medicine, talk to your doctor, pharmacist, or health care provider.  2020 Elsevier/Gold Standard (2019-07-03 16:43:28)  Furosemide Oral Tablets What is this medicine? FUROSEMIDE (fyoor [place] se mide) is a diuretic. It helps you make more urine and to lose salt and excess water from your body. It treats swelling from heart, kidney, or liver disease. It also treats high blood pressure. This medicine may be used for other purposes; ask your health care provider or pharmacist if you have questions. COMMON BRAND NAME(S): Active-Medicated Specimen Kit, Delone, Diuscreen, Lasix, RX Specimen Collection Kit, Specimen Collection Kit, URINX Medicated Specimen Collection What should I tell my health care provider before I take this medicine? They need to know if you have any of these conditions:  abnormal blood electrolytes  diarrhea or vomiting  gout  heart disease  kidney disease, small amounts of urine, or difficulty passing urine  liver disease  thyroid disease  an unusual or allergic reaction to furosemide, sulfa drugs, other medicines, foods, dyes, or preservatives  pregnant or trying to get pregnant  breast-feeding How should I use this medicine? Take this drug by mouth. Take it as directed on the prescription label at the same time every day. You can take it with or without food. If it upsets your stomach, take it with food. Keep taking it unless your health care provider tells you to stop. Talk to your health care provider about the use of this drug in children. Special care may be needed. Overdosage: If you think you have taken too much of this medicine contact a poison control center or emergency room at once. NOTE: This medicine is only for you. Do not share this medicine with others. What if I  miss a dose? If you miss a dose, take it as soon as you can. If it is almost time for your next dose, take only that dose. Do not take double or extra doses. What may interact with this medicine?  aspirin and aspirin-like medicines  certain antibiotics  chloral hydrate  cisplatin  cyclosporine  digoxin  diuretics  laxatives  lithium  medicines for blood pressure  medicines that relax muscles for surgery  methotrexate  NSAIDs, medicines for pain and inflammation like ibuprofen, naproxen, or indomethacin  phenytoin  steroid medicines like prednisone or cortisone  sucralfate  thyroid hormones This list may not describe all possible interactions. Give your  health care provider a list of all the medicines, herbs, non-prescription drugs, or dietary supplements you use. Also tell them if you smoke, drink alcohol, or use illegal drugs. Some items may interact with your medicine. What should I watch for while using this medicine? Visit your doctor or health care provider for regular checks on your progress. Check your blood pressure regularly. Ask your doctor or health care provider what your blood pressure should be, and when you should contact him or her. If you are a diabetic, check your blood sugar as directed. This medicine may cause serious skin reactions. They can happen weeks to months after starting the medicine. Contact your health care provider right away if you notice fevers or flu-like symptoms with a rash. The rash may be red or purple and then turn into blisters or peeling of the skin. Or, you might notice a red rash with swelling of the face, lips or lymph nodes in your neck or under your arms. You may need to be on a special diet while taking this medicine. Check with your doctor. Also, ask how many glasses of fluid you need to drink a day. You must not get dehydrated. You may get drowsy or dizzy. Do not drive, use machinery, or do anything that needs mental  alertness until you know how this drug affects you. Do not stand or sit up quickly, especially if you are an older patient. This reduces the risk of dizzy or fainting spells. Alcohol can make you more drowsy and dizzy. Avoid alcoholic drinks. This medicine can make you more sensitive to the sun. Keep out of the sun. If you cannot avoid being in the sun, wear protective clothing and use sunscreen. Do not use sun lamps or tanning beds/booths. What side effects may I notice from receiving this medicine? Side effects that you should report to your doctor or health care professional as soon as possible:  blood in urine or stools  dry mouth  fever or chills  hearing loss or ringing in the ears  irregular heartbeat  muscle pain or weakness, cramps  rash, fever, and swollen lymph nodes  redness, blistering, peeling or loosening of the skin, including inside the mouth  skin rash  stomach upset, pain, or nausea  tingling or numbness in the hands or feet  unusually weak or tired  vomiting or diarrhea  yellowing of the eyes or skin Side effects that usually do not require medical attention (report to your doctor or health care professional if they continue or are bothersome):  headache  loss of appetite  unusual bleeding or bruising This list may not describe all possible side effects. Call your doctor for medical advice about side effects. You may report side effects to FDA at 1-800-FDA-1088. Where should I keep my medicine? Keep out of the reach of children and pets. Store at room temperature between 20 and 25 degrees C (68 and 77 degrees F). Protect from light and moisture. Keep the container tightly closed. Throw away any unused drug after the expiration date. NOTE: This sheet is a summary. It may not cover all possible information. If you have questions about this medicine, talk to your doctor, pharmacist, or health care provider.  2020 Elsevier/Gold Standard (2019-04-24  18:01:32)      Adopting a Healthy Lifestyle.  Know what a healthy weight is for you (roughly BMI <25) and aim to maintain this   Aim for 7+ servings of fruits and vegetables daily   65-80+ fluid  ounces of water or unsweet tea for healthy kidneys   Limit to max 1 drink of alcohol per day; avoid smoking/tobacco   Limit animal fats in diet for cholesterol and heart health - choose grass fed whenever available   Avoid highly processed foods, and foods high in saturated/trans fats   Aim for low stress - take time to unwind and care for your mental health   Aim for 150 min of moderate intensity exercise weekly for heart health, and weights twice weekly for bone health   Aim for 7-9 hours of sleep daily   When it comes to diets, agreement about the perfect plan isnt easy to find, even among the experts. Experts at the [employer] developed an idea known as the Healthy Eating Plate. Just imagine a plate divided into logical, healthy portions.   The emphasis is on diet quality:   Load up on vegetables and fruits - one-half of your plate: Aim for color and variety, and remember that potatoes dont count.   Go for whole grains - one-quarter of your plate: Whole wheat, barley, wheat berries, quinoa, oats, brown rice, and foods made with them. If you want pasta, go with whole wheat pasta.   Protein power - one-quarter of your plate: Fish, chicken, beans, and nuts are all healthy, versatile protein sources. Limit red meat.   The diet, however, does go beyond the plate, offering a few other suggestions.   Use healthy plant oils, such as olive, canola, soy, corn, sunflower and peanut. Check the labels, and avoid partially hydrogenated oil, which have unhealthy trans fats.   If youre thirsty, drink water. Coffee and tea are good in moderation, but skip sugary drinks and limit milk and dairy products to one or two daily servings.   The type of carbohydrate in the diet is  more important than the amount. Some sources of carbohydrates, such as vegetables, fruits, whole grains, and beans-are healthier than others.   Finally, stay active  Signed, Berniece Salines, DO  06/18/2020 11:37 AM    [place]

## 2020-06-18 NOTE — Patient Instructions (Signed)
Medication Instructions:  Your physician has recommended you make the following change in your medication:   Increase your Diltiazem to 180 mg daily. Start Lasix 20 mg daily. Start KCL 20 mEq daily. You must take your Lipitor 10 mg daily.  *If you need a refill on your cardiac medications before your next appointment, please call your pharmacy*   Lab Work: Your physician recommends that you have labs done in the office today. Your test included  basic metabolic panel, complete blood count and magnesium.  If you have labs (blood work) drawn today and your tests are completely normal, you will receive your results only by: Marland Kitchen MyChart Message (if you have MyChart) OR . A paper copy in the mail If you have any lab test that is abnormal or we need to change your treatment, we will call you to review the results.   Testing/Procedures: None ordered   Follow-Up: At [facility], you and your health needs are our priority.  As part of our continuing mission to provide you with exceptional heart care, we have created designated Provider Care Teams.  These Care Teams include your primary Cardiologist (physician) and Advanced Practice Providers (APPs -  Physician Assistants and Nurse Practitioners) who all work together to provide you with the care you need, when you need it.  We recommend signing up for the patient portal called "MyChart".  Sign up information is provided on this After Visit Summary.  MyChart is used to connect with patients for Virtual Visits (Telemedicine).  Patients are able to view lab/test results, encounter notes, upcoming appointments, etc.  Non-urgent messages can be sent to your provider as well.   To learn more about what you can do with MyChart, go to [email or web address].    Your next appointment:   4-6  week(s)  The format for your next appointment:   In Person  Provider:   Berniece Salines, DO   Other Instructions Potassium Chloride Extended-Release  Capsules What is this medicine? POTASSIUM CHLORIDE (poe TASS i um KLOOR ide) is a potassium supplement used to prevent and to treat low potassium. Potassium is important for the heart, muscles, and nerves. Too much or too little potassium in the body can cause serious problems. This medicine may be used for other purposes; ask your health care provider or pharmacist if you have questions. COMMON BRAND NAME(S): Klor-Con, Micro-K, Micro-K Extencaps What should I tell my health care provider before I take this medicine? They need to know if you have any of these conditions:  Addison disease  dehydration  diabetes, high blood sugar  difficulty swallowing  heart disease  high levels of potassium in the blood  irregular heartbeat or rhythm  kidney disease  large areas of burned skin  stomach ulcers, other stomach or intestine problems  an unusual or allergic reaction to potassium, other medicines, foods, dyes, or preservatives  pregnant or trying to get pregnant  breast-feeding How should I use this medicine? Take this drug by mouth with a glass of water. Take it as directed on the prescription label at the same time every day. Take it with food. Do not cut, crush, chew, or suck this drug. Swallow the capsules whole. You may open the capsule and put the contents in a teaspoon of soft food, such as applesauce or pudding. Do not add to hot foods. Swallow the mixture right away. Do not chew the mixture. Drink a glass of water or juice after taking the mixture. Keep taking this medicine  unless your health care provider tells you to stop. Talk to your health care provider about the use of this drug in children. Special care may be needed. Overdosage: If you think you have taken too much of this medicine contact a poison control center or emergency room at once. NOTE: This medicine is only for you. Do not share this medicine with others. What if I miss a dose? If you miss a dose, take it  as soon as you can. If it is almost time for your next dose, take only that dose. Do not take double or extra doses. What may interact with this medicine? Do not take this medicine with any of the following medications:  certain diuretics such as spironolactone, triamterene  certain medicines for stomach problems like atropine; difenoxin and glycopyrrolate  eplerenone  sodium polystyrene sulfonate This medicine may also interact with the following medications:  certain medicines for blood pressure or heart disease like lisinopril, losartan, quinapril, valsartan  medicines that lower your chance of fighting infection such as cyclosporine, tacrolimus  NSAIDs, medicines for pain and inflammation, like ibuprofen or naproxen  other potassium supplements  salt substitutes This list may not describe all possible interactions. Give your health care provider a list of all the medicines, herbs, non-prescription drugs, or dietary supplements you use. Also tell them if you smoke, drink alcohol, or use illegal drugs. Some items may interact with your medicine. What should I watch for while using this medicine? Visit your doctor or health care professional for regular check ups. You will need lab work done regularly. You may need to be on a special diet while taking this medicine. Ask your doctor. What side effects may I notice from receiving this medicine? Side effects that you should report to your doctor or health care professional as soon as possible:  allergic reactions like skin rash, itching or hives, swelling of the face, lips, or tongue  black, tarry stools  breathing problems  confusion  heartburn  fast, irregular heartbeat  feeling faint or lightheaded, falls  low blood pressure  numbness or tingling in hands or feet  pain when swallowing  unusually weak or tired  weakness, heaviness of legs Side effects that usually do not require medical attention (report to your  doctor or health care professional if they continue or are bothersome):  diarrhea  nausea, vomiting  stomach pain This list may not describe all possible side effects. Call your doctor for medical advice about side effects. You may report side effects to FDA at 1-800-FDA-1088. Where should I keep my medicine? Keep out of the reach of children. Store at room temperature between 15 and 30 degrees C (59 and 86 degrees F ). Keep bottle closed tightly to protect this medicine from light and moisture. Throw away any unused medicine after the expiration date. NOTE: This sheet is a summary. It may not cover all possible information. If you have questions about this medicine, talk to your doctor, pharmacist, or health care provider.  2020 Elsevier/Gold Standard (2019-07-03 16:43:28)  Furosemide Oral Tablets What is this medicine? FUROSEMIDE (fyoor [place] se mide) is a diuretic. It helps you make more urine and to lose salt and excess water from your body. It treats swelling from heart, kidney, or liver disease. It also treats high blood pressure. This medicine may be used for other purposes; ask your health care provider or pharmacist if you have questions. COMMON BRAND NAME(S): Active-Medicated Specimen Kit, Delone, Diuscreen, Lasix, RX Specimen Collection Kit,  Specimen Collection Kit, E[place] Medicated Specimen Collection What should I tell my health care provider before I take this medicine? They need to know if you have any of these conditions:  abnormal blood electrolytes  diarrhea or vomiting  gout  heart disease  kidney disease, small amounts of urine, or difficulty passing urine  liver disease  thyroid disease  an unusual or allergic reaction to furosemide, sulfa drugs, other medicines, foods, dyes, or preservatives  pregnant or trying to get pregnant  breast-feeding How should I use this medicine? Take this drug by mouth. Take it as directed on the prescription label at the same  time every day. You can take it with or without food. If it upsets your stomach, take it with food. Keep taking it unless your health care provider tells you to stop. Talk to your health care provider about the use of this drug in children. Special care may be needed. Overdosage: If you think you have taken too much of this medicine contact a poison control center or emergency room at once. NOTE: This medicine is only for you. Do not share this medicine with others. What if I miss a dose? If you miss a dose, take it as soon as you can. If it is almost time for your next dose, take only that dose. Do not take double or extra doses. What may interact with this medicine?  aspirin and aspirin-like medicines  certain antibiotics  chloral hydrate  cisplatin  cyclosporine  digoxin  diuretics  laxatives  lithium  medicines for blood pressure  medicines that relax muscles for surgery  methotrexate  NSAIDs, medicines for pain and inflammation like ibuprofen, naproxen, or indomethacin  phenytoin  steroid medicines like prednisone or cortisone  sucralfate  thyroid hormones This list may not describe all possible interactions. Give your health care provider a list of all the medicines, herbs, non-prescription drugs, or dietary supplements you use. Also tell them if you smoke, drink alcohol, or use illegal drugs. Some items may interact with your medicine. What should I watch for while using this medicine? Visit your doctor or health care provider for regular checks on your progress. Check your blood pressure regularly. Ask your doctor or health care provider what your blood pressure should be, and when you should contact him or her. If you are a diabetic, check your blood sugar as directed. This medicine may cause serious skin reactions. They can happen weeks to months after starting the medicine. Contact your health care provider right away if you notice fevers or flu-like symptoms  with a rash. The rash may be red or purple and then turn into blisters or peeling of the skin. Or, you might notice a red rash with swelling of the face, lips or lymph nodes in your neck or under your arms. You may need to be on a special diet while taking this medicine. Check with your doctor. Also, ask how many glasses of fluid you need to drink a day. You must not get dehydrated. You may get drowsy or dizzy. Do not drive, use machinery, or do anything that needs mental alertness until you know how this drug affects you. Do not stand or sit up quickly, especially if you are an older patient. This reduces the risk of dizzy or fainting spells. Alcohol can make you more drowsy and dizzy. Avoid alcoholic drinks. This medicine can make you more sensitive to the sun. Keep out of the sun. If you cannot avoid being in the  sun, wear protective clothing and use sunscreen. Do not use sun lamps or tanning beds/booths. What side effects may I notice from receiving this medicine? Side effects that you should report to your doctor or health care professional as soon as possible:  blood in urine or stools  dry mouth  fever or chills  hearing loss or ringing in the ears  irregular heartbeat  muscle pain or weakness, cramps  rash, fever, and swollen lymph nodes  redness, blistering, peeling or loosening of the skin, including inside the mouth  skin rash  stomach upset, pain, or nausea  tingling or numbness in the hands or feet  unusually weak or tired  vomiting or diarrhea  yellowing of the eyes or skin Side effects that usually do not require medical attention (report to your doctor or health care professional if they continue or are bothersome):  headache  loss of appetite  unusual bleeding or bruising This list may not describe all possible side effects. Call your doctor for medical advice about side effects. You may report side effects to FDA at 1-800-FDA-1088. Where should I keep my  medicine? Keep out of the reach of children and pets. Store at room temperature between 20 and 25 degrees C (68 and 77 degrees F). Protect from light and moisture. Keep the container tightly closed. Throw away any unused drug after the expiration date. NOTE: This sheet is a summary. It may not cover all possible information. If you have questions about this medicine, talk to your doctor, pharmacist, or health care provider.  2020 Elsevier/Gold Standard (2019-04-24 18:01:32)

## 2020-06-19 LAB — CBC WITH DIFFERENTIAL/PLATELET
Basophils Absolute: 0.1 10*3/uL (ref 0.0–0.2)
Basos: 1 %
EOS (ABSOLUTE): 0.1 10*3/uL (ref 0.0–0.4)
Eos: 1 %
Hematocrit: 34.8 % (ref 34.0–46.6)
Hemoglobin: 11.3 g/dL (ref 11.1–15.9)
Immature Grans (Abs): 0 10*3/uL (ref 0.0–0.1)
Immature Granulocytes: 0 %
Lymphocytes Absolute: 1.2 10*3/uL (ref 0.7–3.1)
Lymphs: 15 %
MCH: 31.7 pg (ref 26.6–33.0)
MCHC: 32.5 g/dL (ref 31.5–35.7)
MCV: 98 fL — ABNORMAL HIGH (ref 79–97)
Monocytes Absolute: 0.8 10*3/uL (ref 0.1–0.9)
Monocytes: 10 %
Neutrophils Absolute: 5.7 10*3/uL (ref 1.4–7.0)
Neutrophils: 73 %
Platelets: 303 10*3/uL (ref 150–450)
RBC: 3.57 x10E6/uL — ABNORMAL LOW (ref 3.77–5.28)
RDW: 12.1 % (ref 11.7–15.4)
WBC: 7.8 10*3/uL (ref 3.4–10.8)

## 2020-06-19 LAB — BASIC METABOLIC PANEL
BUN/Creatinine Ratio: 32 — ABNORMAL HIGH (ref 12–28)
BUN: 27 mg/dL (ref 8–27)
CO2: 24 mmol/L (ref 20–29)
Calcium: 9.5 mg/dL (ref 8.7–10.3)
Chloride: 104 mmol/L (ref 96–106)
Creatinine, Ser: 0.84 mg/dL (ref 0.57–1.00)
GFR calc Af Amer: 73 mL/min/{1.73_m2} (ref >59)
GFR calc non Af Amer: 63 mL/min/{1.73_m2} (ref >59)
Glucose: 94 mg/dL (ref 65–99)
Potassium: 5.4 mmol/L — ABNORMAL HIGH (ref 3.5–5.2)
Sodium: 140 mmol/L (ref 134–144)

## 2020-06-19 LAB — MAGNESIUM: Magnesium: 1.8 mg/dL (ref 1.6–2.3)

## 2020-06-20 DIAGNOSIS — R2243 Localized swelling, mass and lump, lower limb, bilateral: Secondary | ICD-10-CM | POA: Insufficient documentation

## 2020-06-20 DIAGNOSIS — W5501XA Bitten by cat, initial encounter: Secondary | ICD-10-CM

## 2020-06-20 HISTORY — DX: Bitten by cat, initial encounter: W55.01XA

## 2020-06-20 HISTORY — DX: Localized swelling, mass and lump, lower limb, bilateral: R22.43

## 2020-06-20 NOTE — Addendum Note (Signed)
Addended by: Eleonore Chiquito on: 06/20/2020 09:25 AM   Modules accepted: Orders

## 2020-06-23 ENCOUNTER — Telehealth: Payer: Self-pay | Admitting: Cardiology

## 2020-06-23 NOTE — Telephone Encounter (Signed)
Pt c/o swelling: STAT is pt has developed SOB within 24 hours  1) How much weight have you gained and in what time span?   2) If swelling, where is the swelling located? Feet and legs  3) Are you currently taking a fluid pill? yes  4) Are you currently SOB? no  5) Do you have a log of your daily weights (if so, list)? no  6) Have you gained 3 pounds in a day or 5 pounds in a week?   7) Have you traveled recently? No   Patient says the swelling in her legs has gotten bad that her legs are leaking. She has some bleeding and a hole in her leg. She soaked a towel with blood and water overnight. She does not want to go to the ER but would like to see Dr. Servando Salina asap. She normally sees Dr. Servando Salina in [place] but is willing to go to [facility]

## 2020-06-23 NOTE — Telephone Encounter (Signed)
Patient says the swelling in her legs has gotten bad that her legs are leaking. She has some bleeding and a puncture in her leg. She soaked a towel with blood and water overnight. She does not want to go to the ER but would like to see Dr. Servando Salina ASAP. She states that she was so impressed with Dr. Servando Salina at her last appointment that she would really like to be evaluated by her again. She denies any pain other than discomfort in her legs. She denies any SOB and she does not have a scale to weigh herself. Appt made for tomorrow at 11am with Dr. Servando Salina. Patient aware to call back if any new concerns present.

## 2020-06-23 NOTE — Telephone Encounter (Signed)
Please have her increase her Lasix to 40 mg daily in the meantime.

## 2020-06-24 ENCOUNTER — Encounter: Payer: Self-pay | Admitting: Cardiology

## 2020-06-24 ENCOUNTER — Emergency Department (HOSPITAL_BASED_OUTPATIENT_CLINIC_OR_DEPARTMENT_OTHER): Payer: Medicare PPO

## 2020-06-24 ENCOUNTER — Encounter (HOSPITAL_BASED_OUTPATIENT_CLINIC_OR_DEPARTMENT_OTHER): Payer: Self-pay | Admitting: *Deleted

## 2020-06-24 ENCOUNTER — Other Ambulatory Visit: Payer: Self-pay

## 2020-06-24 ENCOUNTER — Ambulatory Visit: Payer: Medicare PPO | Admitting: Cardiology

## 2020-06-24 ENCOUNTER — Inpatient Hospital Stay (HOSPITAL_BASED_OUTPATIENT_CLINIC_OR_DEPARTMENT_OTHER)
Admission: EM | Admit: 2020-06-24 | Discharge: 2020-07-03 | DRG: 291 | Disposition: A | Discharge status: Skilled Nursing Facility | Payer: Medicare PPO | Attending: Internal Medicine | Admitting: Internal Medicine

## 2020-06-24 VITALS — BP 100/54 | HR 84 | Ht 70.0 in | Wt 177.1 lb

## 2020-06-24 DIAGNOSIS — I5033 Acute on chronic diastolic (congestive) heart failure: Secondary | ICD-10-CM | POA: Diagnosis present

## 2020-06-24 DIAGNOSIS — E8770 Fluid overload, unspecified: Secondary | ICD-10-CM

## 2020-06-24 DIAGNOSIS — N390 Urinary tract infection, site not specified: Secondary | ICD-10-CM | POA: Diagnosis not present

## 2020-06-24 DIAGNOSIS — I4819 Other persistent atrial fibrillation: Secondary | ICD-10-CM

## 2020-06-24 DIAGNOSIS — F419 Anxiety disorder, unspecified: Secondary | ICD-10-CM | POA: Diagnosis not present

## 2020-06-24 DIAGNOSIS — I2510 Atherosclerotic heart disease of native coronary artery without angina pectoris: Secondary | ICD-10-CM | POA: Diagnosis not present

## 2020-06-24 DIAGNOSIS — R531 Weakness: Secondary | ICD-10-CM | POA: Diagnosis present

## 2020-06-24 DIAGNOSIS — E559 Vitamin D deficiency, unspecified: Secondary | ICD-10-CM | POA: Diagnosis present

## 2020-06-24 DIAGNOSIS — E038 Other specified hypothyroidism: Secondary | ICD-10-CM | POA: Diagnosis present

## 2020-06-24 DIAGNOSIS — Z8249 Family history of ischemic heart disease and other diseases of the circulatory system: Secondary | ICD-10-CM

## 2020-06-24 DIAGNOSIS — F321 Major depressive disorder, single episode, moderate: Secondary | ICD-10-CM | POA: Diagnosis present

## 2020-06-24 DIAGNOSIS — Y929 Unspecified place or not applicable: Secondary | ICD-10-CM

## 2020-06-24 DIAGNOSIS — G8929 Other chronic pain: Secondary | ICD-10-CM | POA: Diagnosis not present

## 2020-06-24 DIAGNOSIS — E039 Hypothyroidism, unspecified: Secondary | ICD-10-CM | POA: Diagnosis present

## 2020-06-24 DIAGNOSIS — I10 Essential (primary) hypertension: Secondary | ICD-10-CM | POA: Diagnosis present

## 2020-06-24 DIAGNOSIS — R310 Gross hematuria: Secondary | ICD-10-CM | POA: Diagnosis not present

## 2020-06-24 DIAGNOSIS — I509 Heart failure, unspecified: Secondary | ICD-10-CM

## 2020-06-24 DIAGNOSIS — Z23 Encounter for immunization: Secondary | ICD-10-CM | POA: Diagnosis not present

## 2020-06-24 DIAGNOSIS — Z880 Allergy status to penicillin: Secondary | ICD-10-CM

## 2020-06-24 DIAGNOSIS — W5501XA Bitten by cat, initial encounter: Secondary | ICD-10-CM

## 2020-06-24 DIAGNOSIS — E7800 Pure hypercholesterolemia, unspecified: Secondary | ICD-10-CM | POA: Diagnosis not present

## 2020-06-24 DIAGNOSIS — I071 Rheumatic tricuspid insufficiency: Secondary | ICD-10-CM | POA: Diagnosis not present

## 2020-06-24 DIAGNOSIS — I480 Paroxysmal atrial fibrillation: Secondary | ICD-10-CM

## 2020-06-24 DIAGNOSIS — I878 Other specified disorders of veins: Secondary | ICD-10-CM | POA: Diagnosis present

## 2020-06-24 DIAGNOSIS — I5032 Chronic diastolic (congestive) heart failure: Secondary | ICD-10-CM | POA: Insufficient documentation

## 2020-06-24 DIAGNOSIS — S81851A Open bite, right lower leg, initial encounter: Secondary | ICD-10-CM | POA: Diagnosis present

## 2020-06-24 DIAGNOSIS — I130 Hypertensive heart and chronic kidney disease with heart failure and stage 1 through stage 4 chronic kidney disease, or unspecified chronic kidney disease: Principal | ICD-10-CM | POA: Diagnosis present

## 2020-06-24 DIAGNOSIS — Z66 Do not resuscitate: Secondary | ICD-10-CM | POA: Diagnosis not present

## 2020-06-24 DIAGNOSIS — I951 Orthostatic hypotension: Secondary | ICD-10-CM | POA: Diagnosis not present

## 2020-06-24 DIAGNOSIS — I272 Pulmonary hypertension, unspecified: Secondary | ICD-10-CM | POA: Diagnosis not present

## 2020-06-24 DIAGNOSIS — Z882 Allergy status to sulfonamides status: Secondary | ICD-10-CM

## 2020-06-24 DIAGNOSIS — Z7901 Long term (current) use of anticoagulants: Secondary | ICD-10-CM

## 2020-06-24 DIAGNOSIS — Z79899 Other long term (current) drug therapy: Secondary | ICD-10-CM

## 2020-06-24 DIAGNOSIS — N182 Chronic kidney disease, stage 2 (mild): Secondary | ICD-10-CM | POA: Diagnosis present

## 2020-06-24 DIAGNOSIS — E785 Hyperlipidemia, unspecified: Secondary | ICD-10-CM | POA: Diagnosis not present

## 2020-06-24 DIAGNOSIS — Z20822 Contact with and (suspected) exposure to covid-19: Secondary | ICD-10-CM | POA: Diagnosis not present

## 2020-06-24 DIAGNOSIS — Z79890 Hormone replacement therapy (postmenopausal): Secondary | ICD-10-CM

## 2020-06-24 DIAGNOSIS — Z888 Allergy status to other drugs, medicaments and biological substances status: Secondary | ICD-10-CM

## 2020-06-24 DIAGNOSIS — Z825 Family history of asthma and other chronic lower respiratory diseases: Secondary | ICD-10-CM

## 2020-06-24 DIAGNOSIS — Z87440 Personal history of urinary (tract) infections: Secondary | ICD-10-CM

## 2020-06-24 DIAGNOSIS — Z87891 Personal history of nicotine dependence: Secondary | ICD-10-CM | POA: Diagnosis not present

## 2020-06-24 DIAGNOSIS — I4891 Unspecified atrial fibrillation: Secondary | ICD-10-CM | POA: Diagnosis present

## 2020-06-24 HISTORY — DX: Chronic diastolic (congestive) heart failure: I50.32

## 2020-06-24 LAB — CBC WITH DIFFERENTIAL/PLATELET
Abs Immature Granulocytes: 0.03 10*3/uL (ref 0.00–0.07)
Basophils Absolute: 0.1 10*3/uL (ref 0.0–0.1)
Basophils Relative: 1 %
Eosinophils Absolute: 0.1 10*3/uL (ref 0.0–0.5)
Eosinophils Relative: 2 %
HCT: 35.9 % — ABNORMAL LOW (ref 36.0–46.0)
Hemoglobin: 12.3 g/dL (ref 12.0–15.0)
Immature Granulocytes: 0 %
Lymphocytes Relative: 14 %
Lymphs Abs: 1 10*3/uL (ref 0.7–4.0)
MCH: 33.2 pg (ref 26.0–34.0)
MCHC: 34.3 g/dL (ref 30.0–36.0)
MCV: 96.8 fL (ref 80.0–100.0)
Monocytes Absolute: 0.6 10*3/uL (ref 0.1–1.0)
Monocytes Relative: 8 %
Neutro Abs: 5.6 10*3/uL (ref 1.7–7.7)
Neutrophils Relative %: 75 %
Platelets: 289 10*3/uL (ref 150–400)
RBC: 3.71 MIL/uL — ABNORMAL LOW (ref 3.87–5.11)
RDW: 14 % (ref 11.5–15.5)
WBC: 7.4 10*3/uL (ref 4.0–10.5)
nRBC: 0 % (ref 0.0–0.2)

## 2020-06-24 LAB — RESPIRATORY PANEL BY RT PCR (FLU A&B, COVID)
Influenza A by PCR: NEGATIVE
Influenza B by PCR: NEGATIVE
SARS Coronavirus 2 by RT PCR: NEGATIVE

## 2020-06-24 LAB — BASIC METABOLIC PANEL
Anion gap: 10 (ref 5–15)
BUN: 26 mg/dL — ABNORMAL HIGH (ref 8–23)
CO2: 26 mmol/L (ref 22–32)
Calcium: 8.9 mg/dL (ref 8.9–10.3)
Chloride: 99 mmol/L (ref 98–111)
Creatinine, Ser: 0.98 mg/dL (ref 0.44–1.00)
GFR calc non Af Amer: 52 mL/min — ABNORMAL LOW (ref >60)
Glucose, Bld: 105 mg/dL — ABNORMAL HIGH (ref 70–99)
Potassium: 4.2 mmol/L (ref 3.5–5.1)
Sodium: 135 mmol/L (ref 135–145)

## 2020-06-24 LAB — TROPONIN I (HIGH SENSITIVITY)
Troponin I (High Sensitivity): 9 ng/L (ref <18)
Troponin I (High Sensitivity): 9 ng/L (ref <18)

## 2020-06-24 LAB — BRAIN NATRIURETIC PEPTIDE: B Natriuretic Peptide: 346.3 pg/mL — ABNORMAL HIGH (ref 0.0–100.0)

## 2020-06-24 MED ORDER — ATORVASTATIN CALCIUM 10 MG PO TABS
10.0000 mg | ORAL_TABLET | Freq: Every day | ORAL | Status: DC
  Administered 2020-06-25 – 2020-07-03 (×9): 10 mg via ORAL
  Filled 2020-06-24 (×9): qty 1

## 2020-06-24 MED ORDER — SODIUM CHLORIDE 0.9% FLUSH
3.0000 mL | Freq: Two times a day (BID) | INTRAVENOUS | Status: DC
  Administered 2020-06-25 – 2020-07-03 (×18): 3 mL via INTRAVENOUS

## 2020-06-24 MED ORDER — SODIUM CHLORIDE 0.9 % IV SOLN: 250.0000 mL | INTRAVENOUS | Status: DC | PRN

## 2020-06-24 MED ORDER — TETANUS-DIPHTH-ACELL PERTUSSIS 5-2.5-18.5 LF-MCG/0.5 IM SUSP
0.5000 mL | Freq: Once | INTRAMUSCULAR | Status: AC
  Administered 2020-06-24: 0.5 mL via INTRAMUSCULAR
  Filled 2020-06-24: qty 0.5

## 2020-06-24 MED ORDER — AMOXICILLIN-POT CLAVULANATE 875-125 MG PO TABS
1.0000 | ORAL_TABLET | Freq: Two times a day (BID) | ORAL | Status: DC
  Administered 2020-06-24 – 2020-06-27 (×7): 1 via ORAL
  Filled 2020-06-24 (×7): qty 1

## 2020-06-24 MED ORDER — APIXABAN 5 MG PO TABS
5.0000 mg | ORAL_TABLET | Freq: Two times a day (BID) | ORAL | Status: DC
  Administered 2020-06-25 – 2020-07-03 (×17): 5 mg via ORAL
  Filled 2020-06-24 (×18): qty 1

## 2020-06-24 MED ORDER — ACETAMINOPHEN 325 MG PO TABS
650.0000 mg | ORAL_TABLET | ORAL | Status: DC | PRN
  Filled 2020-06-24: qty 2

## 2020-06-24 MED ORDER — ONDANSETRON HCL 4 MG/2ML IJ SOLN: 4.0000 mg | Freq: Four times a day (QID) | INTRAMUSCULAR | Status: DC | PRN

## 2020-06-24 MED ORDER — CARVEDILOL 6.25 MG PO TABS
6.2500 mg | ORAL_TABLET | Freq: Two times a day (BID) | ORAL | Status: DC
  Administered 2020-06-25 – 2020-06-28 (×7): 6.25 mg via ORAL
  Filled 2020-06-24 (×8): qty 1

## 2020-06-24 MED ORDER — FUROSEMIDE 10 MG/ML IJ SOLN
40.0000 mg | Freq: Two times a day (BID) | INTRAMUSCULAR | Status: DC
  Administered 2020-06-25 – 2020-06-26 (×2): 40 mg via INTRAVENOUS
  Filled 2020-06-24 (×4): qty 4

## 2020-06-24 MED ORDER — LEVOTHYROXINE SODIUM 100 MCG PO TABS
100.0000 ug | ORAL_TABLET | Freq: Every day | ORAL | Status: DC
  Administered 2020-06-25 – 2020-06-29 (×5): 100 ug via ORAL
  Filled 2020-06-24 (×5): qty 1

## 2020-06-24 MED ORDER — FUROSEMIDE 10 MG/ML IJ SOLN
40.0000 mg | Freq: Once | INTRAMUSCULAR | Status: AC
  Administered 2020-06-24: 40 mg via INTRAVENOUS
  Filled 2020-06-24: qty 4

## 2020-06-24 MED ORDER — SODIUM CHLORIDE 0.9% FLUSH: 3.0000 mL | INTRAVENOUS | Status: DC | PRN

## 2020-06-24 MED ORDER — IRBESARTAN 75 MG PO TABS
75.0000 mg | ORAL_TABLET | Freq: Every day | ORAL | Status: DC
  Administered 2020-06-25 – 2020-06-30 (×6): 75 mg via ORAL
  Filled 2020-06-24 (×6): qty 1

## 2020-06-24 MED ORDER — DILTIAZEM HCL ER COATED BEADS 180 MG PO CP24
180.0000 mg | ORAL_CAPSULE | Freq: Every day | ORAL | Status: DC
  Administered 2020-06-25 – 2020-06-27 (×3): 180 mg via ORAL
  Filled 2020-06-24 (×4): qty 1

## 2020-06-24 NOTE — Progress Notes (Signed)
Cardiology Office Note:    Date:  06/24/2020   ID:  [name], DOB 1932-08-28, MRN [id number]  PCP:  Everlean Cherry, MD  Cardiologist:  Thomasene Ripple, DO  Electrophysiologist:  None   Referring MD: Everlean Cherry, MD  "I have been having worsening leg edema"  History of Present Illness:    [name] is a 86 y.o. female with a hx of of coronary artery disease seen on coronary CTA at which time the patient was started on aspirin and statin, persistent atrial fibrillation unclear which was actually diagnosed according to the patient has been many years she is on Eliquis and Cardizem, moderate tricuspid regurgitation, mild mitral regurgitation presents today for hospital follow-up.  I did see the patient August 18, 2020 at that time she was in atrial fibrillation with rapid ventricular rate therefore increase her Cardizem to 180 mg continue the patient on her Eliquis.  We discussed that she was to stay on anticoagulation for 4 weeks without interruption and plans for possible rhythm control with synchronized DC cardioversion.  In addition she did have some bilateral leg edema her Lasix dosing will also optimized.  Interim the patient, states that she was experiencing worsening leg edema and therefore I recommended to see the patient soon.  She presented today for follow-up visit she tells me that she has had been needing to use back tells to wrap around her legs when she goes to bed as her leg is weeping with fluids.     Past Medical History:  Diagnosis Date  . Abnormal nuclear stress test 11/15/2019  . Abnormal perfusion scintigraphy 07/22/2014   Lexiscan MPS Jan 2012 with mild mis and apical anterior ischemia EF 59% she was seen at [facility] MPS Jan 2012 with mild mis and apical anterior ischemia EF 59% she was seen at [facility] of this note might be different from the original. Overview:  Lexiscan MPS Jan 2012 with mild mis and apical anterior ischemia EF 59% she was seen  at [facility] of this note might be different from  . Abnormal results of function studies of other organs and systems 07/22/2014   Lexiscan MPS Jan 2012 with mild mis and apical anterior ischemia EF 59% she was seen at [facility] of this note might be different from the original. Overview:  Lexiscan MPS Jan 2012 with mild mis and apical anterior ischemia EF 59% she was seen at [facility]  . Acute cystitis without hematuria 11/15/2014  . Anxiety disorder 07/22/2014  . B12 deficiency 07/22/2014  . Benign hypertensive heart and kidney disease with chronic kidney disease 07/22/2014  . CAD (coronary artery disease) 01/02/2020  . Chest tightness 09/10/2019  . Chronic kidney disease, stage 2 (mild) 07/22/2014  . Degeneration, intervertebral disc, cervical 07/22/2014  . Depression, major, single episode, moderate (HCC) 07/22/2014  . Drug rash 07/22/2014  . Drug therapy 12/11/2015  . Dyspnea on exertion 09/10/2019  . Essential hypertension 09/10/2019  . Fall at home 07/17/2018  . Fatigue 07/22/2014  . Former smoker 10/20/2017  . Gout 07/22/2014  . Gross hematuria 10/08/2014  . Hypercholesterolemia 07/22/2014  . Hypertonicity of bladder 07/22/2014  . Hypothyroidism (acquired) 07/22/2014   Qualifier: Diagnosis of  By: Everardo All MD, Cleophas Dunker  Formatting of this note might be different from the original. Overview:  Qualifier: Diagnosis of  By: Everardo All MD, Cleophas Dunker HYPOTHYROIDISM, POST-RADIATION 06/11/2009   Qualifier: Diagnosis of  By: Everardo All MD, Cleophas Dunker   .  Idiopathic chronic gout of multiple sites without tophus 12/11/2015  . Idiopathic peripheral neuropathy 12/11/2015  . Increased frequency of urination 07/22/2014  . Left atrial enlargement 07/22/2014   mild  Formatting of this note might be different from the original. mild  . Lethargy 07/22/2014  . Low back pain 07/22/2014  . Mitral regurgitation 09/10/2019  . Obesity 07/22/2014  . Onychomycosis due to dermatophyte 07/22/2014  . Osteoarthritis, generalized  07/22/2014  . Osteopenia 07/22/2014  . Osteoporosis 08/24/2018   DEXA 08/23/2018  Formatting of this note might be different from the original. DEXA 08/23/2018  . Palpitations 09/10/2019  . Pulmonary hypertension (HCC) 12/08/2017  . PVC (premature ventricular contraction) 04/17/2018  . RBBB 10/20/2017  . Recurrent UTI 12/11/2015  . Sciatica 07/22/2014  . Spinal stenosis of lumbar region 12/11/2015  . Thyroid disease   . TMJ arthralgia 07/22/2014  . Unspecified abnormalities of gait and mobility 07/22/2014  . Urge incontinence of urine 07/22/2014   Wears 8 extra absorbent pads during day an on overnight pad at night  Formatting of this note might be different from the original. Wears 8 extra absorbent pads during day an on overnight pad at night  . URINARY INCONTINENCE 06/11/2009   Qualifier: Diagnosis of  By: Everardo All MD, Sean A   . Urinary tract infection, site not specified 04/04/2014  . UTI (urinary tract infection)   . Varicose veins 07/22/2014  . Venous insufficiency   . Venous stasis dermatitis of both lower extremities 07/22/2014  . Vitamin D deficiency 07/22/2014    Past Surgical History:  Procedure Laterality Date  . BACK SURGERY    . JOINT REPLACEMENT     hip and knee    Current Medications: Current Meds  Medication Sig  . allopurinol (ZYLOPRIM) 100 MG tablet Take 100 mg by mouth daily.  Marland Kitchen atorvastatin (LIPITOR) 10 MG tablet Take 1 tablet (10 mg total) by mouth daily.  . carvedilol (COREG) 6.25 MG tablet Take 1 tablet (6.25 mg total) by mouth 2 (two) times daily with a meal.  . cholecalciferol (VITAMIN D) 1000 UNITS tablet Take 1,000 Units by mouth daily.  . colchicine 0.6 MG tablet Take 0.6 mg by mouth daily.  Marland Kitchen diltiazem (CARDIZEM CD) 180 MG 24 hr capsule Take 1 capsule (180 mg total) by mouth daily.  Marland Kitchen ELIQUIS 5 MG TABS tablet Take 5 mg by mouth 2 (two) times daily.  . furosemide (LASIX) 20 MG tablet Take 1 tablet (20 mg total) by mouth daily.  Marland Kitchen levothyroxine (SYNTHROID,  LEVOTHROID) 100 MCG tablet Take 100 mcg by mouth daily.  Marland Kitchen telmisartan (MICARDIS) 20 MG tablet Take 20 mg by mouth daily.   . vitamin B-12 (CYANOCOBALAMIN) 1000 MCG tablet Take 1,000 mcg by mouth daily.     Allergies:   Isosorbide nitrate, Cephalexin, Gentamicin, Penicillins, and Sulfa antibiotics   Social History   Socioeconomic History  . Marital status: Widowed    Spouse name: Not on file  . Number of children: Not on file  . Years of education: Not on file  . Highest education level: Not on file  Occupational History  . Not on file  Tobacco Use  . Smoking status: Never Smoker  . Smokeless tobacco: Never Used  Substance and Sexual Activity  . Alcohol use: No  . Drug use: No  . Sexual activity: Not on file  Other Topics Concern  . Not on file  Social History Narrative  . Not on file   Social Determinants of Health  Financial Resource Strain:   . Difficulty of Paying Living Expenses: Not on file  Food Insecurity:   . Worried About Programme researcher, broadcasting/film/video in the Last Year: Not on file  . Ran Out of Food in the Last Year: Not on file  Transportation Needs:   . Lack of Transportation (Medical): Not on file  . Lack of Transportation (Non-Medical): Not on file  Physical Activity:   . Days of Exercise per Week: Not on file  . Minutes of Exercise per Session: Not on file  Stress:   . Feeling of Stress : Not on file  Social Connections:   . Frequency of Communication with Friends and Family: Not on file  . Frequency of Social Gatherings with Friends and Family: Not on file  . Attends Religious Services: Not on file  . Active Member of Clubs or Organizations: Not on file  . Attends Banker Meetings: Not on file  . Marital Status: Not on file     Family History: The patient's family history includes Cancer in her mother; Emphysema in her brother; Heart attack in her brother; Heart disease in her brother; Hypertension in her mother; Prostate cancer in her  father.  ROS:   Review of Systems  Constitution: Negative for decreased appetite, fever and weight gain.  HENT: Negative for congestion, ear discharge, hoarse voice and sore throat.   Eyes: Negative for discharge, redness, vision loss in right eye and visual halos.  Cardiovascular: Negative for chest pain, dyspnea on exertion, leg swelling, orthopnea and palpitations.  Respiratory: Negative for cough, hemoptysis, shortness of breath and snoring.   Endocrine: Negative for heat intolerance and polyphagia.  Hematologic/Lymphatic: Negative for bleeding problem. Does not bruise/bleed easily.  Skin: Negative for flushing, nail changes, rash and suspicious lesions.  Musculoskeletal: Negative for arthritis, joint pain, muscle cramps, myalgias, neck pain and stiffness.  Gastrointestinal: Negative for abdominal pain, bowel incontinence, diarrhea and excessive appetite.  Genitourinary: Negative for decreased libido, genital sores and incomplete emptying.  Neurological: Negative for brief paralysis, focal weakness, headaches and loss of balance.  Psychiatric/Behavioral: Negative for altered mental status, depression and suicidal ideas.  Allergic/Immunologic: Negative for HIV exposure and persistent infections.    EKGs/Labs/Other Studies Reviewed:    The following studies were reviewed today:   EKG: None today  Echocardiogram done on June 12, 2020 shows normal LV function 55 to 60%.  Reference goes mildly enlarged.  Left atrium is mildly dilated by volume.  Right atrium is moderately enlarged.  Mild to moderate mitral regurgitation.  Mild mitral valve prolapse noted.  Moderate tricuspid regurgitation.  Mild pulmonary hypertension 42 mmHg.  CCTA  Aorta: Normal size. Scattered calcifcations in the ascending and descending aorta. No dissection.  Aortic Valve: Trileaflet. No calcifications.  Coronary Arteries: Normal coronary origin. Right dominance.  RCA is a large dominant artery  that gives rise to PDA and PLVB. There is mild calcified plaque in the proximal RCA with associated stenosis of 25-49%.  Left main is a large artery that gives rise to LAD and LCX arteries. There is no plaque.  LAD is a large vessel that gives rise to a small D1 and D2. There is mild calcified plaque in the proximal LAD with associated stenosis of 25-49% and moderate calcified plaque in the mid LAD with associated stenosis of 50-69%.  LCX is a non-dominant artery that gives rise to one small OM1 branch. There is mild calcified plaque in the ostial and mid LCx with associated stenosis  of 25-49%.  Other findings:  Normal pulmonary vein drainage into the left atrium.  Normal let atrial appendage without a thrombus.  Normal size of the pulmonary artery.  IMPRESSION: 1. Coronary calcium score of 329. This was 65th percentile for age and sex matched control.  2. Normal coronary origin with right dominance.  3. Moderate atherosclerosis. CAD-RADS 3.  4. Consider symptom-guided anti-ischemic and preventive pharmacotherapy as well as risk factor modification per guideline-directed care.  5. This study has been submitted for FFR analysis.  FFRct 1. Left Main: No significant stenosis. FFR = 0.97.  2. LAD: No significant stenosis. Proximal FFR = 0.96, Mid FFR = 0.92, Distal FFR = 0.89.  3. LCX: No significant stenosis. Proximal FFR = 0.98, Mid FFR = 0.95, Distal FFR = 0.92.  4. RCA: No significant stenosis. Proximal FFR = 0.98, Mid FFR = 0.97, Distal FFR = 0.92.  IMPRESSION: 1. CT FFR flow analysis shows no hemodynamically significant flow limiting lesions.   Recent Labs: 01/03/2020: ALT 10 06/18/2020: BUN 27; Creatinine, Ser 0.84; Hemoglobin 11.3; Magnesium 1.8; Platelets 303; Potassium 5.4; Sodium 140  Recent Lipid Panel    Component Value Date/Time   CHOL 180 01/03/2020 0911   TRIG 57 01/03/2020 0911   HDL 74 01/03/2020 0911   CHOLHDL 2.4  01/03/2020 0911   LDLCALC 95 01/03/2020 0911    Physical Exam:    VS:  BP (!) 100/54 (BP Location: Right Arm, Patient Position: Sitting, Cuff Size: Normal)   Pulse 84   Ht  (1.778 m)   Wt 177 lb 1.9 oz (80.3 kg)   BMI 25.41 kg/m     Wt Readings from Last 3 Encounters:  06/24/20 177 lb 1.9 oz (80.3 kg)  06/18/20 186 lb 9.6 oz (84.6 kg)  01/02/20 178 lb 6.4 oz (80.9 kg)     GEN: Well nourished, well developed in no acute distress HEENT: Normal NECK: No JVD; No carotid bruits LYMPHATICS: No lymphadenopathy CARDIAC: S1S2 noted,RRR, no murmurs, rubs, gallops RESPIRATORY:  Clear to auscultation without rales, wheezing or rhonchi  ABDOMEN: Soft, non-tender, non-distended, +bowel sounds, no guarding. EXTREMITIES: No edema, No cyanosis, no clubbing MUSCULOSKELETAL:  No deformity  SKIN: Warm and dry NEUROLOGIC:  Alert and oriented x 3, non-focal PSYCHIATRIC:  Normal affect, good insight  ASSESSMENT:    1. Acute on chronic diastolic congestive heart failure (HCC)   2. Moderate tricuspid regurgitation   3. Persistent atrial fibrillation (HCC)    PLAN:     Appears to be significant fluid overloaded.  I think at this time IV Lasix will be of great for the patient at this time.  Going to take her down to the emergency department at which time she will get blood work and some IV Lasix.  Hoping the patient can be admitted for the next couple days for complete good diuresis.  In addition when she seen the patient I am hoping that she can also at which time get her TEE cardioversion if possible to get the patient back into sinus rhythm which may also help with keeping her out of it exacerbation of heart failure.  She does not have any anginal symptoms.    The patient is in agreement with the above plan. The patient left the office in stable condition.  Follow-up will be determined post hospitalization.  Medication Adjustments/Labs and Tests Ordered: Current medicines are reviewed  at length with the patient today.  Concerns regarding medicines are outlined above.  No orders of the  defined types were placed in this encounter.  No orders of the defined types were placed in this encounter.   Patient Instructions     Adopting a Healthy Lifestyle.  Know what a healthy weight is for you (roughly BMI <25) and aim to maintain this   Aim for 7+ servings of fruits and vegetables daily   65-80+ fluid ounces of water or unsweet tea for healthy kidneys   Limit to max 1 drink of alcohol per day; avoid smoking/tobacco   Limit animal fats in diet for cholesterol and heart health - choose grass fed whenever available   Avoid highly processed foods, and foods high in saturated/trans fats   Aim for low stress - take time to unwind and care for your mental health   Aim for 150 min of moderate intensity exercise weekly for heart health, and weights twice weekly for bone health   Aim for 7-9 hours of sleep daily   When it comes to diets, agreement about the perfect plan isnt easy to find, even among the experts. Experts at the [employer] developed an idea known as the Healthy Eating Plate. Just imagine a plate divided into logical, healthy portions.   The emphasis is on diet quality:   Load up on vegetables and fruits - one-half of your plate: Aim for color and variety, and remember that potatoes dont count.   Go for whole grains - one-quarter of your plate: Whole wheat, barley, wheat berries, quinoa, oats, brown rice, and foods made with them. If you want pasta, go with whole wheat pasta.   Protein power - one-quarter of your plate: Fish, chicken, beans, and nuts are all healthy, versatile protein sources. Limit red meat.   The diet, however, does go beyond the plate, offering a few other suggestions.   Use healthy plant oils, such as olive, canola, soy, corn, sunflower and peanut. Check the labels, and avoid partially hydrogenated oil, which have  unhealthy trans fats.   If youre thirsty, drink water. Coffee and tea are good in moderation, but skip sugary drinks and limit milk and dairy products to one or two daily servings.   The type of carbohydrate in the diet is more important than the amount. Some sources of carbohydrates, such as vegetables, fruits, whole grains, and beans-are healthier than others.   Finally, stay active  Signed, Thomasene RippleKardie Panayiota Larkin, DO  06/24/2020 12:10 PM     [facility]

## 2020-06-24 NOTE — Plan of Care (Signed)
  Problem: Coping: Goal: Level of anxiety will decrease Outcome: Progressing   Problem: Pain Managment: Goal: General experience of comfort will improve Outcome: Progressing   Problem: Safety: Goal: Ability to remain free from injury will improve Outcome: Progressing   

## 2020-06-24 NOTE — ED Notes (Signed)
[facility] external urine collection system in use

## 2020-06-24 NOTE — ED Notes (Signed)
Son Vernia Buff called to make aware of bed assignment.

## 2020-06-24 NOTE — ED Notes (Addendum)
Pt. Has 4 scratch type wounds that she reports to RN when asked about these areas....they are cat bites.  Pt. Said not from vicious cat her sweet cat just love bite per Pt.  This is on the R leg.

## 2020-06-24 NOTE — ED Provider Notes (Addendum)
[facility] EMERGENCY DEPARTMENT Provider Note   CSN: [id number] Arrival date & time: 06/24/20  1147     History No chief complaint on file.   [name] is a 86 y.o. female.  Presents to ER with concern for leg swelling.  Patient reports that she has had leg swelling for couple years however it has been worsening lately, followed by cardiology, managed with oral Lasix.  She reports medication compliance.  She does not have shortness of breath.  She also has noted increased weeping, clear drainage from both of her legs.  She also reports that her cat bit her in her right leg a couple days ago.  She reports that she has had previous bite, does not recall last tetanus.  Seen by cardiologist earlier today, recommending admission for IV diuretics.  HPI     Past Medical History:  Diagnosis Date  . Abnormal nuclear stress test 11/15/2019  . Abnormal perfusion scintigraphy 07/22/2014   Lexiscan MPS Jan 2012 with mild mis and apical anterior ischemia EF 59% she was seen at [facility] MPS Jan 2012 with mild mis and apical anterior ischemia EF 59% she was seen at [facility] of this note might be different from the original. Overview:  Lexiscan MPS Jan 2012 with mild mis and apical anterior ischemia EF 59% she was seen at [facility] of this note might be different from  . Abnormal results of function studies of other organs and systems 07/22/2014   Lexiscan MPS Jan 2012 with mild mis and apical anterior ischemia EF 59% she was seen at [facility] of this note might be different from the original. Overview:  Lexiscan MPS Jan 2012 with mild mis and apical anterior ischemia EF 59% she was seen at [facility]  . Acute cystitis without hematuria 11/15/2014  . Anxiety disorder 07/22/2014  . B12 deficiency 07/22/2014  . Benign hypertensive heart and kidney disease with chronic kidney disease 07/22/2014  . CAD (coronary artery disease) 01/02/2020  . Chest tightness 09/10/2019  .  Chronic kidney disease, stage 2 (mild) 07/22/2014  . Degeneration, intervertebral disc, cervical 07/22/2014  . Depression, major, single episode, moderate (HCC) 07/22/2014  . Drug rash 07/22/2014  . Drug therapy 12/11/2015  . Dyspnea on exertion 09/10/2019  . Essential hypertension 09/10/2019  . Fall at home 07/17/2018  . Fatigue 07/22/2014  . Former smoker 10/20/2017  . Gout 07/22/2014  . Gross hematuria 10/08/2014  . Hypercholesterolemia 07/22/2014  . Hypertonicity of bladder 07/22/2014  . Hypothyroidism (acquired) 07/22/2014   Qualifier: Diagnosis of  By: Everardo All MD, Cleophas Dunker  Formatting of this note might be different from the original. Overview:  Qualifier: Diagnosis of  By: Everardo All MD, Cleophas Dunker HYPOTHYROIDISM, POST-RADIATION 06/11/2009   Qualifier: Diagnosis of  By: Everardo All MD, Cleophas Dunker   . Idiopathic chronic gout of multiple sites without tophus 12/11/2015  . Idiopathic peripheral neuropathy 12/11/2015  . Increased frequency of urination 07/22/2014  . Left atrial enlargement 07/22/2014   mild  Formatting of this note might be different from the original. mild  . Lethargy 07/22/2014  . Low back pain 07/22/2014  . Mitral regurgitation 09/10/2019  . Obesity 07/22/2014  . Onychomycosis due to dermatophyte 07/22/2014  . Osteoarthritis, generalized 07/22/2014  . Osteopenia 07/22/2014  . Osteoporosis 08/24/2018   DEXA 08/23/2018  Formatting of this note might be different from the original. DEXA 08/23/2018  . Palpitations 09/10/2019  . Pulmonary hypertension (HCC) 12/08/2017  . PVC (premature  ventricular contraction) 04/17/2018  . RBBB 10/20/2017  . Recurrent UTI 12/11/2015  . Sciatica 07/22/2014  . Spinal stenosis of lumbar region 12/11/2015  . Thyroid disease   . TMJ arthralgia 07/22/2014  . Unspecified abnormalities of gait and mobility 07/22/2014  . Urge incontinence of urine 07/22/2014   Wears 8 extra absorbent pads during day an on overnight pad at night  Formatting of this note might be different from  the original. Wears 8 extra absorbent pads during day an on overnight pad at night  . URINARY INCONTINENCE 06/11/2009   Qualifier: Diagnosis of  By: Everardo All MD, Sean A   . Urinary tract infection, site not specified 04/04/2014  . UTI (urinary tract infection)   . Varicose veins 07/22/2014  . Venous insufficiency   . Venous stasis dermatitis of both lower extremities 07/22/2014  . Vitamin D deficiency 07/22/2014    Patient Active Problem List   Diagnosis Date Noted  . Persistent atrial fibrillation (HCC) 06/18/2020  . Moderate tricuspid regurgitation 06/18/2020  . Thyroid disease   . CAD (coronary artery disease) 01/02/2020  . Abnormal nuclear stress test 11/15/2019  . Essential hypertension 09/10/2019  . Mitral regurgitation 09/10/2019  . Palpitations 09/10/2019  . Dyspnea on exertion 09/10/2019  . Chest tightness 09/10/2019  . Osteoporosis 08/24/2018  . Fall at home 07/17/2018  . PVC (premature ventricular contraction) 04/17/2018  . Pulmonary hypertension (HCC) 12/08/2017  . Former smoker 10/20/2017  . RBBB 10/20/2017  . Drug therapy 12/11/2015  . Idiopathic chronic gout of multiple sites without tophus 12/11/2015  . Idiopathic peripheral neuropathy 12/11/2015  . Recurrent UTI 12/11/2015  . Spinal stenosis of lumbar region 12/11/2015  . Acute cystitis without hematuria 11/15/2014  . Gross hematuria 10/08/2014  . Abnormal perfusion scintigraphy 07/22/2014  . Abnormal results of function studies of other organs and systems 07/22/2014  . B12 deficiency 07/22/2014  . Benign hypertensive heart and kidney disease with chronic kidney disease 07/22/2014  . Chronic kidney disease, stage 2 (mild) 07/22/2014  . Degeneration, intervertebral disc, cervical 07/22/2014  . Depression, major, single episode, moderate (HCC) 07/22/2014  . Drug rash 07/22/2014  . Fatigue 07/22/2014  . Anxiety disorder 07/22/2014  . Hypercholesterolemia 07/22/2014  . Hypertonicity of bladder 07/22/2014  . Gout  07/22/2014  . Hypothyroidism (acquired) 07/22/2014  . Increased frequency of urination 07/22/2014  . Left atrial enlargement 07/22/2014  . Lethargy 07/22/2014  . Low back pain 07/22/2014  . Obesity 07/22/2014  . Onychomycosis due to dermatophyte 07/22/2014  . Osteoarthritis, generalized 07/22/2014  . Osteopenia 07/22/2014  . Sciatica 07/22/2014  . Venous stasis dermatitis of both lower extremities 07/22/2014  . Venous insufficiency 07/22/2014  . Varicose veins 07/22/2014  . Urge incontinence of urine 07/22/2014  . Unspecified abnormalities of gait and mobility 07/22/2014  . TMJ arthralgia 07/22/2014  . Vitamin D deficiency 07/22/2014  . Urinary tract infection, site not specified 04/04/2014  . HYPOTHYROIDISM, POST-RADIATION 06/11/2009  . URINARY INCONTINENCE 06/11/2009    Past Surgical History:  Procedure Laterality Date  . BACK SURGERY    . JOINT REPLACEMENT     hip and knee     OB History   No obstetric history on file.     Family History  Problem Relation Age of Onset  . Cancer Mother   . Hypertension Mother   . Prostate cancer Father   . Heart attack Brother   . Heart disease Brother   . Emphysema Brother     Social History   Tobacco Use  .  Smoking status: Never Smoker  . Smokeless tobacco: Never Used  Substance Use Topics  . Alcohol use: No  . Drug use: No    Home Medications Prior to Admission medications   Medication Sig Start Date End Date Taking? Authorizing Provider  allopurinol (ZYLOPRIM) 100 MG tablet Take 100 mg by mouth daily.    [provider]  atorvastatin (LIPITOR) 10 MG tablet Take 1 tablet (10 mg total) by mouth daily. 06/18/20 09/16/20  Tobb, Kardie, DO  carvedilol (COREG) 6.25 MG tablet Take 1 tablet (6.25 mg total) by mouth 2 (two) times daily with a meal. 01/02/20   Revankar, Aundra Dubin, MD  cholecalciferol (VITAMIN D) 1000 UNITS tablet Take 1,000 Units by mouth daily.    [provider]  colchicine 0.6 MG tablet Take  0.6 mg by mouth daily.    [provider]  diltiazem (CARDIZEM CD) 180 MG 24 hr capsule Take 1 capsule (180 mg total) by mouth daily. 06/18/20   Tobb, Kardie, DO  ELIQUIS 5 MG TABS tablet Take 5 mg by mouth 2 (two) times daily. 06/13/20   [provider]  furosemide (LASIX) 20 MG tablet Take 1 tablet (20 mg total) by mouth daily. 06/18/20   Tobb, Kardie, DO  levothyroxine (SYNTHROID, LEVOTHROID) 100 MCG tablet Take 100 mcg by mouth daily.    [provider]  telmisartan (MICARDIS) 20 MG tablet Take 20 mg by mouth daily.  06/14/20   [provider]  vitamin B-12 (CYANOCOBALAMIN) 1000 MCG tablet Take 1,000 mcg by mouth daily.    [provider]    Allergies    Isosorbide nitrate, Cephalexin, Gentamicin, Penicillins, and Sulfa antibiotics  Review of Systems   Review of Systems  Constitutional: Negative for chills and fever.  HENT: Negative for ear pain and sore throat.   Eyes: Negative for pain and visual disturbance.  Respiratory: Negative for cough and shortness of breath.   Cardiovascular: Negative for chest pain and palpitations.  Gastrointestinal: Negative for abdominal pain and vomiting.  Genitourinary: Negative for dysuria and hematuria.  Musculoskeletal: Positive for arthralgias. Negative for back pain.  Skin: Negative for color change and rash.  Neurological: Negative for seizures and syncope.  All other systems reviewed and are negative.   Physical Exam Updated Vital Signs BP 116/66 (BP Location: Right Arm)   Pulse (!) 57   Temp 98.3 F (36.8 C) (Oral)   Resp 19   Ht 5\' 10"  (1.778 m)   SpO2 100%   BMI 25.41 kg/m   Physical Exam Vitals and nursing note reviewed.  Constitutional:      General: She is not in acute distress.    Appearance: She is well-developed.  HENT:     Head: Normocephalic and atraumatic.  Eyes:     Conjunctiva/sclera: Conjunctivae normal.  Cardiovascular:     Rate and Rhythm: Normal rate and regular  rhythm.     Heart sounds: No murmur heard.   Pulmonary:     Effort: Pulmonary effort is normal. No respiratory distress.     Breath sounds: Normal breath sounds.  Abdominal:     Palpations: Abdomen is soft.     Tenderness: There is no abdominal tenderness.  Musculoskeletal:     Cervical back: Neck supple.     Comments: Bilateral pitting edema of lower legs through the level of the knee, noted for subcentimeter puncture wounds to right leg, no purulence, no erythema  Skin:    General: Skin is warm.  Capillary Refill: Capillary refill takes less than 2 seconds.  Neurological:     General: No focal deficit present.     Mental Status: She is alert and oriented to person, place, and time.  Psychiatric:        Mood and Affect: Mood normal.        Behavior: Behavior normal.     ED Results / Procedures / Treatments   Labs (all labs ordered are listed, but only abnormal results are displayed) Labs Reviewed  CBC WITH DIFFERENTIAL/PLATELET - Abnormal; Notable for the following components:      Result Value   RBC 3.71 (*)    HCT 35.9 (*)    All other components within normal limits  BASIC METABOLIC PANEL - Abnormal; Notable for the following components:   Glucose, Bld 105 (*)    BUN 26 (*)    GFR calc non Af Amer 52 (*)    All other components within normal limits  BRAIN NATRIURETIC PEPTIDE - Abnormal; Notable for the following components:   B Natriuretic Peptide 346.3 (*)    All other components within normal limits  RESPIRATORY PANEL BY RT PCR (FLU A&B, COVID)  TROPONIN I (HIGH SENSITIVITY)  TROPONIN I (HIGH SENSITIVITY)    EKG None  Radiology DG Chest 2 View  Result Date: 06/24/2020 CLINICAL DATA:  Leg swelling EXAM: CHEST - 2 VIEW COMPARISON:  June 11, 2020 FINDINGS: The cardiomediastinal silhouette is unchanged and enlarged in contour. No pleural effusion. No pneumothorax. Unchanged linear opacity in the RIGHT lung base, likely atelectasis/scar. Visualized abdomen  is unremarkable. Multilevel degenerative changes of the thoracic spine. IMPRESSION: No acute cardiopulmonary abnormality. Electronically Signed   By: Meda Klinefelter MD   On: 06/24/2020 13:17    Procedures Procedures (including critical care time)  Medications Ordered in ED Medications  amoxicillin-clavulanate (AUGMENTIN) 875-125 MG per tablet 1 tablet (1 tablet Oral Given 06/24/20 1413)  furosemide (LASIX) injection 40 mg (has no administration in time range)  Tdap (BOOSTRIX) injection 0.5 mL (0.5 mLs Intramuscular Given 06/24/20 1414)    ED Course  I have reviewed the triage vital signs and the nursing notes.  Pertinent labs & imaging results that were available during my care of the patient were reviewed by me and considered in my medical decision making (see chart for details).    MDM Rules/Calculators/A&P                          86 year old lady presents to ER with concern for leg swelling.  Endorsed long history of leg swelling but recently worsening, seen by cardiology earlier today.  She is recommending patient be admitted for IV diuretic therapy.  Basic labs were within normal limits, BNP elevated consistent with heart failure/fluid overload.  Will start IV Lasix and consult hospitalist for admission.     Incidentally physical exam, noted small healing puncture wound to her right lower leg, patient reports recent cat bite.  Will start amoxicillin. Patient is unsure of rabies status, staff will touch base with animal control to determine if they will want to monitor her cat versus administer rabies vaccine and rabies immunoglobulin.  Final Clinical Impression(s) / ED Diagnoses Final diagnoses:  Hypervolemia, unspecified hypervolemia type  Heart failure, unspecified HF chronicity, unspecified heart failure type (HCC)  Cat bite, initial encounter    Rx / DC Orders ED Discharge Orders    None       Milagros Loll, MD 06/24/20  [id number]    Milagros Lollykstra, Carlisia Geno S,  MD 06/24/20 [phone]

## 2020-06-24 NOTE — ED Triage Notes (Signed)
Swelling to her legs for a couple of years. Over the past week she has drainage coming from both legs. Large amount of fluid draining from her legs onto the floor at triage.

## 2020-06-24 NOTE — ED Notes (Signed)
Pt states unsure of vaccination status of cat that bit her.  It is a neighborhood cat that she has been feeding.  Registration made aware of need to contact animal control.  Call to son to attempt to find out more about the cat.

## 2020-06-24 NOTE — H&P (Signed)
History and Physical    [name] FUX:[id number] DOB: 01-22-1932 DOA: 06/24/2020  PCP: Everlean Cherry, MD  Patient coming from: [facility] ED via cardiology office  I have personally briefly reviewed patient's old medical records in [facility]  Chief Complaint: Lower extremity edema  HPI: [name] is a 86 y.o. female with medical history significant for CAD, chronic diastolic CHF (EF 02-54%, G2 DD by TTE 10/31/2019), persistent atrial fibrillation on Eliquis, RBBB, hypertension, hyperlipidemia, and osteoarthritis and chronic back pain who presents to the ED for evaluation of progressive lower extremity edema.  Patient was recently hospitalized at [facility] 06/11/2020.  She initially went for evaluation of a cat bite however was found to be in A. fib with RVR.  She was discharged on oral diltiazem and Eliquis.  She has been on oral Lasix since then.  About 1 week ago she began having increasing lower extremity edema on both of her legs.  She has been having dyspnea with minimal exertion and becomes easily fatigued.  She has not had any chest pain or palpitations. She also reports another cat bite in her right lower leg occurring about 2 weeks ago. She denies any subjective fevers, chills, or diaphoresis.  She denies any nausea, vomiting, abdominal pain, or dysuria.   [facility] ED Course:  Initial vitals showed BP 130/84, pulse 81, RR 20, temp 98.3 Fahrenheit, SPO2 97% on room air.  Labs show WBC 7.4, hemoglobin 12.3, platelets 289,000, sodium 135, potassium 4.2, bicarb 26, BUN 26, creatinine 0.98, serum glucose 105, BNP 346.3, high-sensitivity troponin I 9x2.  SARS-CoV-2 PCR and influenza A/B PCR's are negative.  2 view chest x-ray shows enlarged cardiac silhouette without focal consolidation, edema, or effusion.  Patient was given IV Lasix 40 mg once and started on oral Augmentin.  The hospitalist service was consulted to admit for further  evaluation and management.  Review of Systems: All systems reviewed and are negative except as documented in history of present illness above.   Past Medical History:  Diagnosis Date  . Abnormal perfusion scintigraphy 07/22/2014   Lexiscan MPS Jan 2012 with mild mis and apical anterior ischemia EF 59% she was seen at [facility] MPS Jan 2012 with mild mis and apical anterior ischemia EF 59% she was seen at [facility] of this note might be different from the original. Overview:  Lexiscan MPS Jan 2012 with mild mis and apical anterior ischemia EF 59% she was seen at [facility] of this note might be different from  . Anxiety disorder 07/22/2014  . B12 deficiency 07/22/2014  . Benign hypertensive heart and kidney disease with chronic kidney disease 07/22/2014  . CAD (coronary artery disease) 01/02/2020  . Chronic kidney disease, stage 2 (mild) 07/22/2014  . Degeneration, intervertebral disc, cervical 07/22/2014  . Depression, major, single episode, moderate (HCC) 07/22/2014  . Essential hypertension 09/10/2019  . Former smoker 10/20/2017  . Hypercholesterolemia 07/22/2014  . HYPOTHYROIDISM, POST-RADIATION 06/11/2009   Qualifier: Diagnosis of  By: Everardo All MD, Cleophas Dunker   . Idiopathic chronic gout of multiple sites without tophus 12/11/2015  . Idiopathic peripheral neuropathy 12/11/2015  . Low back pain 07/22/2014  . Mitral regurgitation 09/10/2019  . Onychomycosis due to dermatophyte 07/22/2014  . Osteoarthritis, generalized 07/22/2014  . Osteoporosis 08/24/2018   DEXA 08/23/2018  Formatting of this note might be different from the original. DEXA 08/23/2018  . Pulmonary hypertension (HCC) 12/08/2017  . PVC (premature ventricular contraction)  04/17/2018  . RBBB 10/20/2017  . Recurrent UTI 12/11/2015  . Sciatica 07/22/2014  . Spinal stenosis of lumbar region 12/11/2015  . TMJ arthralgia 07/22/2014  . Unspecified abnormalities of gait and mobility 07/22/2014  . Urge incontinence of urine 07/22/2014    Wears 8 extra absorbent pads during day an on overnight pad at night  Formatting of this note might be different from the original. Wears 8 extra absorbent pads during day an on overnight pad at night  . Varicose veins 07/22/2014  . Venous stasis dermatitis of both lower extremities 07/22/2014  . Vitamin D deficiency 07/22/2014    Past Surgical History:  Procedure Laterality Date  . BACK SURGERY    . JOINT REPLACEMENT     hip and knee    Social History:  reports that she has never smoked. She has never used smokeless tobacco. She reports that she does not drink alcohol and does not use drugs.  Allergies  Allergen Reactions  . Isosorbide Nitrate Other (See Comments)    headache  . Cephalexin Rash  . Gentamicin Dermatitis  . Penicillins Rash  . Sulfa Antibiotics Palpitations    Family History  Problem Relation Age of Onset  . Cancer Mother   . Hypertension Mother   . Prostate cancer Father   . Heart attack Brother   . Heart disease Brother   . Emphysema Brother      Prior to Admission medications   Medication Sig Start Date End Date Taking? Authorizing Provider  allopurinol (ZYLOPRIM) 100 MG tablet Take 100 mg by mouth daily.    [provider]  atorvastatin (LIPITOR) 10 MG tablet Take 1 tablet (10 mg total) by mouth daily. 06/18/20 09/16/20  Tobb, Kardie, DO  carvedilol (COREG) 6.25 MG tablet Take 1 tablet (6.25 mg total) by mouth 2 (two) times daily with a meal. 01/02/20   Revankar, Aundra Dubin, MD  cholecalciferol (VITAMIN D) 1000 UNITS tablet Take 1,000 Units by mouth daily.    [provider]  colchicine 0.6 MG tablet Take 0.6 mg by mouth daily.    [provider]  diltiazem (CARDIZEM CD) 180 MG 24 hr capsule Take 1 capsule (180 mg total) by mouth daily. 06/18/20   Tobb, Kardie, DO  ELIQUIS 5 MG TABS tablet Take 5 mg by mouth 2 (two) times daily. 06/13/20   [provider]  furosemide (LASIX) 20 MG tablet Take 1 tablet (20 mg total) by mouth  daily. 06/18/20   Tobb, Kardie, DO  levothyroxine (SYNTHROID, LEVOTHROID) 100 MCG tablet Take 100 mcg by mouth daily.    [provider]  telmisartan (MICARDIS) 20 MG tablet Take 20 mg by mouth daily.  06/14/20   [provider]  vitamin B-12 (CYANOCOBALAMIN) 1000 MCG tablet Take 1,000 mcg by mouth daily.    [provider]    Physical Exam: Vitals:   06/24/20 1407 06/24/20 1553 06/24/20 1801 06/24/20 2100  BP: 116/66 130/76 124/78 (!) 125/59  Pulse: (!) 57 70 67 82  Resp: 19 16 16 20   Temp:    (!) 97.4 F (36.3 C)  TempSrc:    Oral  SpO2: 100% 98% 97% 96%  Height:       Constitutional: Elderly woman resting in bed with head elevated, NAD, calm, comfortable Eyes: PERRL, lids and conjunctivae normal ENMT: Mucous membranes are moist. Posterior pharynx clear of any exudate or lesions.Normal dentition.  Neck: normal, supple, no masses. Respiratory: clear to auscultation bilaterally, no wheezing, no crackles. Normal respiratory  effort. No accessory muscle use.  Cardiovascular: Irregularly irregular, no murmurs / rubs / gallops.  +2 pitting edema bilateral lower extremities.  2+ pedal pulses. Abdomen: no tenderness, no masses palpated. No hepatosplenomegaly. Bowel sounds positive.  Musculoskeletal: no clubbing / cyanosis. No joint deformity upper and lower extremities. Good ROM, no contractures. Normal muscle tone.  Skin: 4 puncture wounds right anterior lower leg from cat bite without active drainage or surrounding erythema. Neurologic: CN 2-12 grossly intact. Sensation intact, Strength 5/5 in all 4.  Psychiatric: Normal judgment and insight. Alert and oriented x 3. Normal mood.     Labs on Admission: I have personally reviewed following labs and imaging studies  CBC: Recent Labs  Lab 06/18/20 1127 06/24/20 1340  WBC 7.8 7.4  NEUTROABS 5.7 5.6  HGB 11.3 12.3  HCT 34.8 35.9*  MCV 98* 96.8  PLT 303 289   Basic Metabolic Panel: Recent Labs  Lab  06/18/20 1127 06/24/20 1340  NA 140 135  K 5.4* 4.2  CL 104 99  CO2 24 26  GLUCOSE 94 105*  BUN 27 26*  CREATININE 0.84 0.98  CALCIUM 9.5 8.9  MG 1.8  --    GFR: Estimated Creatinine Clearance: 44.6 mL/min (by C-G formula based on SCr of 0.98 mg/dL). Liver Function Tests: No results for input(s): AST, ALT, ALKPHOS, BILITOT, PROT, ALBUMIN in the last 168 hours. No results for input(s): LIPASE, AMYLASE in the last 168 hours. No results for input(s): AMMONIA in the last 168 hours. Coagulation Profile: No results for input(s): INR, PROTIME in the last 168 hours. Cardiac Enzymes: No results for input(s): CKTOTAL, CKMB, CKMBINDEX, TROPONINI in the last 168 hours. BNP (last 3 results) No results for input(s): PROBNP in the last 8760 hours. HbA1C: No results for input(s): HGBA1C in the last 72 hours. CBG: No results for input(s): GLUCAP in the last 168 hours. Lipid Profile: No results for input(s): CHOL, HDL, LDLCALC, TRIG, CHOLHDL, LDLDIRECT in the last 72 hours. Thyroid Function Tests: No results for input(s): TSH, T4TOTAL, FREET4, T3FREE, THYROIDAB in the last 72 hours. Anemia Panel: No results for input(s): VITAMINB12, FOLATE, FERRITIN, TIBC, IRON, RETICCTPCT in the last 72 hours. Urine analysis:    Component Value Date/Time   COLORURINE YELLOW 11/26/2012 1509   APPEARANCEUR TURBID (A) 11/26/2012 1509   LABSPEC 1.028 11/26/2012 1509   PHURINE 5.5 11/26/2012 1509   GLUCOSEU NEGATIVE 11/26/2012 1509   HGBUR LARGE (A) 11/26/2012 1509   BILIRUBINUR NEGATIVE 11/26/2012 1509   KETONESUR NEGATIVE 11/26/2012 1509   PROTEINUR 100 (A) 11/26/2012 1509   UROBILINOGEN 0.2 11/26/2012 1509   NITRITE POSITIVE (A) 11/26/2012 1509   LEUKOCYTESUR LARGE (A) 11/26/2012 1509    Radiological Exams on Admission: DG Chest 2 View  Result Date: 06/24/2020 CLINICAL DATA:  Leg swelling EXAM: CHEST - 2 VIEW COMPARISON:  June 11, 2020 FINDINGS: The cardiomediastinal silhouette is unchanged  and enlarged in contour. No pleural effusion. No pneumothorax. Unchanged linear opacity in the RIGHT lung base, likely atelectasis/scar. Visualized abdomen is unremarkable. Multilevel degenerative changes of the thoracic spine. IMPRESSION: No acute cardiopulmonary abnormality. Electronically Signed   By: Meda Klinefelter MD   On: 06/24/2020 13:17    EKG: Independently reviewed. Atrial fibrillation with controlled rate, RBBB.  When compared to prior, RVR has resolved.  Assessment/Plan Principal Problem:   Acute on chronic diastolic (congestive) heart failure (HCC) Active Problems:   Essential hypertension   Depression, major, single episode, moderate (HCC)   Anxiety disorder   Hypercholesterolemia  CAD (coronary artery disease)   Hypothyroidism   Persistent atrial fibrillation (HCC)  Hull Nationeggy Y Parkhurst is a 86 y.o. female with medical history significant for CAD, chronic diastolic CHF (EF 16-10%65-70%, G2 DD by TTE 10/31/2019), persistent atrial fibrillation on Eliquis, RBBB, hypertension, hyperlipidemia, and osteoarthritis and chronic back pain who is admitted with acute on chronic diastolic CHF.  Acute on chronic diastolic CHF exacerbation: With 1 week of progressive bilateral lower extremity edema and dyspnea on exertion. EF 65-70%, G2 DD by TTE 10/31/2019 -Continue IV Lasix 40 mg twice daily -Monitor strict I/O's and daily weights -Obtain echocardiogram  Persistent atrial fibrillation: Recently diagnosed atrial fibrillation.  CHA2DS2-VASc score is 4.  Rate currently controlled. -Continue Coreg 6.25 mg twice daily -Continue diltiazem 180 mg daily -Continue Eliquis 5 mg twice daily  Cat bite to right lower extremity: Reports recurrent cat bite to right lower extremity 2 days prior to admission.  Does not appear to be actively infected.  Continue prophylactic Augmentin at least 3 days.  CAD: Denies any chest pain.  Troponin negative x2.  Continue Eliquis, Coreg,  statin.  Hypertension: Continue home diltiazem, Coreg, telmisartan.  Hyperlipidemia: Continue atorvastatin.  Hypothyroidism: Continue Synthroid.  Generalized weakness: Request PT/OT eval.  DVT prophylaxis: Eliquis Code Status: DNR, confirmed with patient Family Communication: Discussed with patient, she has discussed with family Disposition Plan: From home, dispo pending adequate diuresis and PT/OT eval Consults called: None Admission status:  Status is: Observation  The patient remains OBS appropriate and will d/c before 2 midnights.  Dispo: The patient is from: Home              Anticipated d/c is to: Home versus SNF              Anticipated d/c date is: 2 days              Patient currently is not medically stable to d/c.   Darreld McleanVishal Artesha Wemhoff MD [facility]  If 7PM-7AM, please contact night-coverage [email or web address]  06/24/2020, 9:41 PM

## 2020-06-24 NOTE — Progress Notes (Signed)
[facility] to [facility] transfer:  Patient with h/o CAD; hypothyroidism; HTN; HLD; afib on Eliquis; chronic diastolic CHF; and chronic back pain presenting with LE edema.  Volume overload, managed with PO Lasix but not helping.  Given IV Lasix 40 mg.  Otherwise appears stable, no respiratory issues.  She also had a cat bite on her leg - incidentally given Augmentin.  Trying to contact Animal Control re: need for rabies.  Will observe for IV diuresis.   Georgana Curio, M.D.

## 2020-06-25 ENCOUNTER — Observation Stay (HOSPITAL_COMMUNITY): Payer: Medicare PPO

## 2020-06-25 DIAGNOSIS — Z23 Encounter for immunization: Secondary | ICD-10-CM | POA: Diagnosis present

## 2020-06-25 DIAGNOSIS — R310 Gross hematuria: Secondary | ICD-10-CM | POA: Diagnosis present

## 2020-06-25 DIAGNOSIS — E038 Other specified hypothyroidism: Secondary | ICD-10-CM | POA: Diagnosis present

## 2020-06-25 DIAGNOSIS — Z7901 Long term (current) use of anticoagulants: Secondary | ICD-10-CM | POA: Diagnosis not present

## 2020-06-25 DIAGNOSIS — W5501XA Bitten by cat, initial encounter: Secondary | ICD-10-CM | POA: Diagnosis not present

## 2020-06-25 DIAGNOSIS — I340 Nonrheumatic mitral (valve) insufficiency: Secondary | ICD-10-CM | POA: Diagnosis not present

## 2020-06-25 DIAGNOSIS — I361 Nonrheumatic tricuspid (valve) insufficiency: Secondary | ICD-10-CM | POA: Diagnosis not present

## 2020-06-25 DIAGNOSIS — F321 Major depressive disorder, single episode, moderate: Secondary | ICD-10-CM | POA: Diagnosis present

## 2020-06-25 DIAGNOSIS — Z87891 Personal history of nicotine dependence: Secondary | ICD-10-CM | POA: Diagnosis not present

## 2020-06-25 DIAGNOSIS — S81851A Open bite, right lower leg, initial encounter: Secondary | ICD-10-CM | POA: Diagnosis present

## 2020-06-25 DIAGNOSIS — I272 Pulmonary hypertension, unspecified: Secondary | ICD-10-CM | POA: Diagnosis present

## 2020-06-25 DIAGNOSIS — R531 Weakness: Secondary | ICD-10-CM | POA: Diagnosis present

## 2020-06-25 DIAGNOSIS — I5033 Acute on chronic diastolic (congestive) heart failure: Secondary | ICD-10-CM | POA: Diagnosis present

## 2020-06-25 DIAGNOSIS — N390 Urinary tract infection, site not specified: Secondary | ICD-10-CM | POA: Diagnosis present

## 2020-06-25 DIAGNOSIS — E785 Hyperlipidemia, unspecified: Secondary | ICD-10-CM | POA: Diagnosis present

## 2020-06-25 DIAGNOSIS — N182 Chronic kidney disease, stage 2 (mild): Secondary | ICD-10-CM | POA: Diagnosis present

## 2020-06-25 DIAGNOSIS — I878 Other specified disorders of veins: Secondary | ICD-10-CM | POA: Diagnosis present

## 2020-06-25 DIAGNOSIS — I130 Hypertensive heart and chronic kidney disease with heart failure and stage 1 through stage 4 chronic kidney disease, or unspecified chronic kidney disease: Secondary | ICD-10-CM | POA: Diagnosis present

## 2020-06-25 DIAGNOSIS — E7800 Pure hypercholesterolemia, unspecified: Secondary | ICD-10-CM | POA: Diagnosis present

## 2020-06-25 DIAGNOSIS — Y929 Unspecified place or not applicable: Secondary | ICD-10-CM | POA: Diagnosis not present

## 2020-06-25 DIAGNOSIS — F419 Anxiety disorder, unspecified: Secondary | ICD-10-CM | POA: Diagnosis present

## 2020-06-25 DIAGNOSIS — I480 Paroxysmal atrial fibrillation: Secondary | ICD-10-CM | POA: Diagnosis not present

## 2020-06-25 DIAGNOSIS — G8929 Other chronic pain: Secondary | ICD-10-CM | POA: Diagnosis present

## 2020-06-25 DIAGNOSIS — I951 Orthostatic hypotension: Secondary | ICD-10-CM | POA: Diagnosis present

## 2020-06-25 DIAGNOSIS — I2510 Atherosclerotic heart disease of native coronary artery without angina pectoris: Secondary | ICD-10-CM | POA: Diagnosis present

## 2020-06-25 DIAGNOSIS — Z20822 Contact with and (suspected) exposure to covid-19: Secondary | ICD-10-CM | POA: Diagnosis present

## 2020-06-25 DIAGNOSIS — I4819 Other persistent atrial fibrillation: Secondary | ICD-10-CM | POA: Diagnosis present

## 2020-06-25 DIAGNOSIS — E559 Vitamin D deficiency, unspecified: Secondary | ICD-10-CM | POA: Diagnosis present

## 2020-06-25 DIAGNOSIS — Z66 Do not resuscitate: Secondary | ICD-10-CM | POA: Diagnosis present

## 2020-06-25 LAB — URINALYSIS, ROUTINE W REFLEX MICROSCOPIC
Bilirubin Urine: NEGATIVE
Glucose, UA: NEGATIVE mg/dL
Ketones, ur: NEGATIVE mg/dL
Nitrite: NEGATIVE
Protein, ur: NEGATIVE mg/dL
RBC / HPF: >50 RBC/hpf — ABNORMAL HIGH (ref 0–5)
Specific Gravity, Urine: 1.005 (ref 1.005–1.030)
pH: 5 (ref 5.0–8.0)

## 2020-06-25 LAB — ECHOCARDIOGRAM COMPLETE
Area-P 1/2: 3.05 cm2
Height: 70 in
S' Lateral: 3.61 cm
Weight: 2654.4 oz

## 2020-06-25 LAB — CBC
HCT: 35 % — ABNORMAL LOW (ref 36.0–46.0)
Hemoglobin: 11.8 g/dL — ABNORMAL LOW (ref 12.0–15.0)
MCH: 32.7 pg (ref 26.0–34.0)
MCHC: 33.7 g/dL (ref 30.0–36.0)
MCV: 97 fL (ref 80.0–100.0)
Platelets: 296 10*3/uL (ref 150–400)
RBC: 3.61 MIL/uL — ABNORMAL LOW (ref 3.87–5.11)
RDW: 14 % (ref 11.5–15.5)
WBC: 8.3 10*3/uL (ref 4.0–10.5)
nRBC: 0 % (ref 0.0–0.2)

## 2020-06-25 LAB — BASIC METABOLIC PANEL
Anion gap: 10 (ref 5–15)
BUN: 16 mg/dL (ref 8–23)
CO2: 30 mmol/L (ref 22–32)
Calcium: 8.8 mg/dL — ABNORMAL LOW (ref 8.9–10.3)
Chloride: 98 mmol/L (ref 98–111)
Creatinine, Ser: 1.06 mg/dL — ABNORMAL HIGH (ref 0.44–1.00)
GFR calc non Af Amer: 48 mL/min — ABNORMAL LOW (ref >60)
Glucose, Bld: 140 mg/dL — ABNORMAL HIGH (ref 70–99)
Potassium: 3.6 mmol/L (ref 3.5–5.1)
Sodium: 138 mmol/L (ref 135–145)

## 2020-06-25 LAB — TSH: TSH: 8.527 u[IU]/mL — ABNORMAL HIGH (ref 0.350–4.500)

## 2020-06-25 LAB — MAGNESIUM: Magnesium: 1.8 mg/dL (ref 1.7–2.4)

## 2020-06-25 NOTE — Plan of Care (Signed)
°  Problem: Activity: °Goal: Risk for activity intolerance will decrease °Outcome: Progressing °  °Problem: Elimination: °Goal: Will not experience complications related to urinary retention °Outcome: Progressing °  °Problem: Pain Managment: °Goal: General experience of comfort will improve °Outcome: Progressing °  °Problem: Safety: °Goal: Ability to remain free from injury will improve °Outcome: Progressing °  °

## 2020-06-25 NOTE — Evaluation (Signed)
Physical Therapy Evaluation Patient Details Name: [name] MRN: [id number] DOB: 12-Apr-1932 Today's Date: 06/25/2020   History of Present Illness  [name] is a 86 y.o. female with medical history significant for CAD, chronic diastolic CHF (EF 28-31%, G2 DD by TTE 10/31/2019), persistent atrial fibrillation on Eliquis, RBBB, hypertension, hyperlipidemia, and osteoarthritis and chronic back pain who presents to the ED for evaluation of progressive lower extremity edema.  Clinical Impression  Pt admitted with above diagnosis. Pt was able to get to 3N1 with min guard assist. Did need assist to get LEs out of bed.  Pt feels she can manage at home with [facility] therapies and would like to get a rollator.  Pt currently with functional limitations due to the deficits listed below (see PT Problem List). Pt will benefit from skilled PT to increase their independence and safety with mobility to allow discharge to the venue listed below.    Follow Up Recommendations Home health PT;Supervision/Assistance - 24 hour (HHOT, HH aide)    Equipment Recommendations   (rollator)    Recommendations for Other Services       Precautions / Restrictions Precautions Precautions: Fall Restrictions Weight Bearing Restrictions: No      Mobility  Bed Mobility Overal bed mobility: Needs Assistance Bed Mobility: Rolling;Supine to Sit;Sit to Supine Rolling: Mod assist   Supine to sit: Mod assist;HOB elevated Sit to supine: Mod assist;HOB elevated   General bed mobility comments: assist moving B LE's  Transfers Overall transfer level: Needs assistance Equipment used: 1 person hand held assist Transfers: Sit to/from [employer] Sit to Stand: Min guard Stand pivot transfers: Min guard       General transfer comment: Only agreed to get to [facility] commode. No physical asssist needed.   Ambulation/Gait                Stairs            Wheelchair Mobility    Modified Rankin  (Stroke Patients Only)       Balance Overall balance assessment: Needs assistance Sitting-balance support: Single extremity supported Sitting balance-Leahy Scale: Good Sitting balance - Comments: back discomfort                                     Pertinent Vitals/Pain Pain Assessment: No/denies pain Pain Score: 3  Pain Location: low back Pain Descriptors / Indicators: Dull;Constant Pain Intervention(s): Monitored during session;Repositioned    Home Living Family/patient expects to be discharged to:: Private residence Living Arrangements: Alone Available Help at Discharge: Family;Available PRN/intermittently Type of Home: House Home Access: Stairs to enter Entrance Stairs-Rails: Right Entrance Stairs-Number of Steps: 2 Home Layout: Two level;Able to live on main level with bedroom/bathroom Home Equipment: Dan Humphreys - 2 wheels;Cane - quad;Transport chair;Shower seat;Grab bars - tub/shower;Bedside commode      Prior Function Level of Independence: Independent with assistive device(s)         Comments: used quad cane out in community, RW in house     Hand Dominance   Dominant Hand: Right    Extremity/Trunk Assessment   Upper Extremity Assessment Upper Extremity Assessment: Defer to OT evaluation RUE Deficits / Details: decreased end range shoulder RUE Sensation: WNL RUE Coordination: WNL LUE Deficits / Details: decreased end range shoulder LUE Sensation: WNL LUE Coordination: WNL    Lower Extremity Assessment Lower Extremity Assessment: Generalized weakness    Cervical / Trunk  Assessment Cervical / Trunk Assessment: Normal  Communication   Communication: HOH  Cognition Arousal/Alertness: Awake/alert Behavior During Therapy: WFL for tasks assessed/performed Overall Cognitive Status: Within Functional Limits for tasks assessed                                        General Comments      Exercises     Assessment/Plan     PT Assessment Patient needs continued PT services  PT Problem List Decreased activity tolerance;Decreased balance;Decreased mobility;Decreased knowledge of use of DME;Decreased safety awareness;Decreased knowledge of precautions;Cardiopulmonary status limiting activity       PT Treatment Interventions DME instruction;Gait training;Functional mobility training;Therapeutic activities;Therapeutic exercise;Balance training;Patient/family education    PT Goals (Current goals can be found in the Care Plan section)  Acute Rehab PT Goals Patient Stated Goal: Decrease edema to move better PT Goal Formulation: With patient Time For Goal Achievement: 07/09/20 Potential to Achieve Goals: Good    Frequency Min 3X/week   Barriers to discharge        Co-evaluation               AM-PAC PT "6 Clicks" Mobility  Outcome Measure Help needed turning from your back to your side while in a flat bed without using bedrails?: A Lot Help needed moving from lying on your back to sitting on the side of a flat bed without using bedrails?: A Lot Help needed moving to and from a bed to a chair (including a wheelchair)?: A Little Help needed standing up from a chair using your arms (e.g., wheelchair or bedside chair)?: A Little Help needed to walk in hospital room?: Total Help needed climbing 3-5 steps with a railing? : Total 6 Click Score: 12    End of Session   Activity Tolerance: Patient tolerated treatment well Patient left: in bed;with call bell/phone within reach;with bed alarm set Nurse Communication: Mobility status PT Visit Diagnosis: Muscle weakness (generalized) (M62.81)    Time: 4401-0272 PT Time Calculation (min) (ACUTE ONLY): 21 min   Charges:   PT Evaluation $PT Eval Moderate Complexity: 1 Mod          Vinnie Bobst W,PT Acute Rehabilitation Services Pager:  [phone]  Office:  [phone]    Berline Lopes 06/25/2020, 1:45 PM

## 2020-06-25 NOTE — Progress Notes (Signed)
  Echocardiogram 2D Echocardiogram has been performed.  [name] G Arling Cerone 06/25/2020, 9:28 AM

## 2020-06-25 NOTE — Evaluation (Signed)
Occupational Therapy Evaluation Patient Details Name: [name] MRN: [id number] DOB: [birth year] Today's Date: 06/25/2020    History of Present Illness [name] is a 86 y.o. female with medical history significant for CAD, chronic diastolic CHF (EF 50-09%, G2 DD by TTE 10/31/2019), persistent atrial fibrillation on Eliquis, RBBB, hypertension, hyperlipidemia, and osteoarthritis and chronic back pain who presents to the ED for evaluation of progressive lower extremity edema.   Clinical Impression   Patient admitted with the above diagnosis.  Chronic back pain, decreased activity tolerance, and lower extremity swelling are limiting self care, basic mobility and toilet abilities in the acute setting.  Prior level she was Independent with assistive devices, drove short distances, cared for her own meds, and only had assist for heavy home management.  Currently, she is requiring up to Mod A for basic mobility and and self care.  Patient declined out of bed to the recliner due to back discomfort, but agreed to trial the recliner later.  OT to continue out of bed and ADL assessment ongoing, but patient is requesting [facility] services once she is discharged.  OT to follow in the acute setting.  VSS throughout the eval.  Nursing aware of blood in urine.      Follow Up Recommendations  Home health OT    Equipment Recommendations  None recommended by OT    Recommendations for Other Services       Precautions / Restrictions Precautions Precautions: Fall Restrictions Weight Bearing Restrictions: No      Mobility Bed Mobility Overal bed mobility: Needs Assistance Bed Mobility: Rolling;Supine to Sit;Sit to Supine Rolling: Mod assist   Supine to sit: Mod assist;HOB elevated Sit to supine: Mod assist;HOB elevated   General bed mobility comments: assist moving B LE's  Transfers                 General transfer comment: Declined OOB at this time due to back pain.    Balance Overall  balance assessment: No apparent balance deficits (not formally assessed);Needs assistance Sitting-balance support: Single extremity supported Sitting balance-Leahy Scale: Good Sitting balance - Comments: back discomfort                                   ADL either performed or assessed with clinical judgement   ADL Overall ADL's : Needs assistance/impaired Eating/Feeding: Independent;Bed level   Grooming: Wash/dry hands;Wash/dry face;Set up;Bed level               Lower Body Dressing: Bed level;Maximal assistance               Functional mobility during ADLs: Moderate assistance General ADL Comments: chronic back pain.     Vision Baseline Vision/History: Wears glasses Wears Glasses: At all times Patient Visual Report: No change from baseline       Perception     Praxis      Pertinent Vitals/Pain Pain Assessment: 0-10 Pain Score: 3  Pain Location: low back Pain Descriptors / Indicators: Dull;Constant Pain Intervention(s): Monitored during session;Repositioned     Hand Dominance Right   Extremity/Trunk Assessment Upper Extremity Assessment Upper Extremity Assessment: LUE deficits/detail;RUE deficits/detail RUE Deficits / Details: decreased end range shoulder RUE Sensation: WNL RUE Coordination: WNL LUE Deficits / Details: decreased end range shoulder LUE Sensation: WNL LUE Coordination: WNL   Lower Extremity Assessment Lower Extremity Assessment: Defer to PT evaluation       Communication  Communication Communication: HOH   Cognition Arousal/Alertness: Awake/alert Behavior During Therapy: WFL for tasks assessed/performed Overall Cognitive Status: Within Functional Limits for tasks assessed                                     General Comments       Exercises     Shoulder Instructions      Home Living Family/patient expects to be discharged to:: Private residence Living Arrangements: Alone Available Help at  Discharge: Family;Available PRN/intermittently Type of Home: House Home Access: Stairs to enter [employer] of Steps: 2 Entrance Stairs-Rails: Right Home Layout: Two level;Able to live on main level with bedroom/bathroom Alternate Level Stairs-Number of Steps: 12   Bathroom Shower/Tub: Estate manager/land agent Accessibility: Yes How Accessible: Accessible via walker Home Equipment: Walker - 2 wheels;Cane - quad;Transport chair;Shower seat;Grab bars - tub/shower;Bedside commode          Prior Functioning/Environment Level of Independence: Independent with assistive device(s)                 OT Problem List: Decreased strength;Decreased activity tolerance;Impaired balance (sitting and/or standing);Pain;Increased edema      OT Treatment/Interventions: Self-care/ADL training;Therapeutic exercise;Energy conservation;Therapeutic activities;Balance training    OT Goals(Current goals can be found in the care plan section) Acute Rehab OT Goals Patient Stated Goal: Decrease edema to move better OT Goal Formulation: With patient Time For Goal Achievement: 07/09/20 Potential to Achieve Goals: Good ADL Goals Pt Will Perform Lower Body Bathing: with set-up;sit to/from stand Pt Will Perform Lower Body Dressing: with set-up;sit to/from stand Pt Will Transfer to Toilet: with modified independence;ambulating Pt/caregiver will Perform Home Exercise Program: Increased strength;With written HEP provided  OT Frequency: Min 2X/week   Barriers to D/C: Decreased caregiver support  Patient is requesting HH       Co-evaluation              AM-PAC OT "6 Clicks" Daily Activity     Outcome Measure Help from another person eating meals?: None Help from another person taking care of personal grooming?: A Little Help from another person toileting, which includes using toliet, bedpan, or urinal?: A Lot Help from another person bathing (including washing, rinsing, drying)?: A  Lot Help from another person to put on and taking off regular upper body clothing?: A Lot Help from another person to put on and taking off regular lower body clothing?: A Lot 6 Click Score: 15   End of Session Nurse Communication: Mobility status  Activity Tolerance: Patient limited by pain Patient left: in bed;with call bell/phone within reach  OT Visit Diagnosis: Muscle weakness (generalized) (M62.81);History of falling (Z91.81);Pain Pain - part of body:  (back)                Time: 7654-6503 OT Time Calculation (min): 29 min Charges:  OT General Charges $OT Visit: 1 Visit OT Evaluation $OT Eval Moderate Complexity: 1 Mod OT Treatments $Self Care/Home Management : 8-22 mins  06/25/2020  Rich, OTR/L  Acute Rehabilitation Services  Office:  [phone]   Suzanna Obey 06/25/2020, 10:20 AM

## 2020-06-25 NOTE — Progress Notes (Signed)
PROGRESS NOTE    [name]  GEZ:[id number] DOB: 04-Jun-1932 DOA: 06/24/2020 PCP: Everlean Cherry, MD (Confirm with patient/family/NH records and if not entered, this HAS to be entered at [facility] point of entry. "No PCP" if truly none.)   Brief Narrative: (Start on day 1 of progress note - keep it brief and live) Patient  is a 86 y.o. female with medical history significant for CAD, chronic diastolic CHF (EF 65-03%, G2 DD by TTE 10/31/2019), persistent atrial fibrillation on Eliquis, RBBB, hypertension, hyperlipidemia, and osteoarthritis and chronic back pain who presents to the ED for evaluation of progressive lower extremity edema and worsening dyspnea with exertion.  Chest x-ray showed cardiomegaly without focal consolidation, edema or effusion.  Patient also reported a recent cat bite on her right lower extremity.  Patient is given IV Lasix and started on oral Augmentin and admitted for further managemen patient is also complaining of gross hematuria.  UA is ordered and will hold Eliquis at this time because of gross hematuria.   Assessment & Plan:   Principal Problem:   Acute on chronic diastolic (congestive) heart failure (HCC) Active Problems: Hematuria   Essential hypertension   Hypercholesterolemia   CAD (coronary artery disease)   Hypothyroidism   Persistent atrial fibrillation (HCC)   Cat bite of right lower leg  Acute on chronic diastolic CHF exacerbation. Continue IV Lasix 40 mg daily Monitor daily weights with strict intakes and output Echocardiogram ordered Oxygen supplementation with nasal cannula if needed  Gross hematuria  Patient mentioned that she is having gross hematuria for the last 3 to 4 days without any pain but she is complaining of mild burning of micturition.  Hold Eliquis.  Urinalysis ordered  Cat bite to right lower extremity: Reports recurrent cat bite to right lower extremity 2 days prior to admission.  Does not appear to be actively infected.  Continue  prophylactic Augmentin at least 3 days.  Wound covered with Mepilex dressing  Persistent atrial fibrillation Stable Continue Coreg and diltiazem.  Hold Eliquis because of gross hematuria  Coronary artery disease Stable and denies any chest pain.  Continue Coreg and statin   Hyperlipidemia Continue Lipitor  Hypertension Stable.  Continue diltiazem, Coreg and telmisartan  Generalized weakness PT/OT consult placed for evaluation and treatment      DVT prophylaxis: Hold Eliquis because of gross hematuria.  SCDs Code Status: DNR Family Communication: No family member at bedside Disposition Plan: Not decided yet   Consultants:     Procedures: (Don't include imaging studies which can be auto populated. Include things that cannot be auto populated i.e. Echo, Carotid and venous dopplers, Foley, Bipap, HD, tubes/drains, wound vac, central lines etc)    Antimicrobials: (specify start and planned stop date. Auto populated tables are space occupying and do not give end dates)  Augmentin   Subjective: Patient is a pleasant 86 year old Caucasian female who is admitted for CHF exacerbation.  Patient is complaining of dyspnea with exertion, bilateral lower extremity swelling and gross hematuria otherwise denies fever, chills, chest pain, nausea, vomiting, abdominal pain and diarrhea.  Objective: Vitals:   06/25/20 0400 06/25/20 0500 06/25/20 0600 06/25/20 0700  BP: (!) 117/58 120/73 123/67 129/66  Pulse: 70 69 63 74  Resp: 14 16 16 14   Temp:    97.8 F (36.6 C)  TempSrc:    Oral  SpO2:  97% 95% 96%  Weight:      Height:        Intake/Output Summary (Last 24  hours) at 06/25/2020 0737 Last data filed at 06/25/2020 0024 Gross per 24 hour  Intake 240 ml  Output 1600 ml  Net -1360 ml   Filed Weights   06/24/20 2100 06/24/20 2146 06/25/20 0233  Weight: 76 kg 76 kg 75.3 kg    Examination:  General exam: Appears calm and comfortable  Respiratory system: Clear to  auscultation. Respiratory effort normal. Cardiovascular system: S1 & S2 heard, RRR. No JVD, murmurs, rubs, gallops or clicks.  2+ bilateral lower extremity edema Gastrointestinal system: Abdomen is nondistended, soft and nontender. No organomegaly or masses felt. Normal bowel sounds heard. Central nervous system: Alert and oriented. No focal neurological deficits. Extremities: Symmetric 5 x 5 power. Mepilex dressing on bilateral lower extremities. Skin: Small wounds on both lower extremities because of cat bite covered with clean Mepilex dressing. Psychiatry: Judgement and insight appear normal. Mood & affect appropriate.     Data Reviewed: I have personally reviewed following labs and imaging studies  CBC: Recent Labs  Lab 06/18/20 1127 06/24/20 1340  WBC 7.8 7.4  NEUTROABS 5.7 5.6  HGB 11.3 12.3  HCT 34.8 35.9*  MCV 98* 96.8  PLT 303 289   Basic Metabolic Panel: Recent Labs  Lab 06/18/20 1127 06/24/20 1340  NA 140 135  K 5.4* 4.2  CL 104 99  CO2 24 26  GLUCOSE 94 105*  BUN 27 26*  CREATININE 0.84 0.98  CALCIUM 9.5 8.9  MG 1.8  --    GFR: Estimated Creatinine Clearance: 44.6 mL/min (by C-G formula based on SCr of 0.98 mg/dL). Liver Function Tests: No results for input(s): AST, ALT, ALKPHOS, BILITOT, PROT, ALBUMIN in the last 168 hours. No results for input(s): LIPASE, AMYLASE in the last 168 hours. No results for input(s): AMMONIA in the last 168 hours. Coagulation Profile: No results for input(s): INR, PROTIME in the last 168 hours. Cardiac Enzymes: No results for input(s): CKTOTAL, CKMB, CKMBINDEX, TROPONINI in the last 168 hours. BNP (last 3 results) No results for input(s): PROBNP in the last 8760 hours. HbA1C: No results for input(s): HGBA1C in the last 72 hours. CBG: No results for input(s): GLUCAP in the last 168 hours. Lipid Profile: No results for input(s): CHOL, HDL, LDLCALC, TRIG, CHOLHDL, LDLDIRECT in the last 72 hours. Thyroid Function Tests: No  results for input(s): TSH, T4TOTAL, FREET4, T3FREE, THYROIDAB in the last 72 hours. Anemia Panel: No results for input(s): VITAMINB12, FOLATE, FERRITIN, TIBC, IRON, RETICCTPCT in the last 72 hours. Sepsis Labs: No results for input(s): PROCALCITON, LATICACIDVEN in the last 168 hours.  Recent Results (from the past 240 hour(s))  Respiratory Panel by RT PCR (Flu A&B, Covid) - Nasopharyngeal Swab     Status: None   Collection Time: 06/24/20  1:40 PM   Specimen: Nasopharyngeal Swab  Result Value Ref Range Status   SARS Coronavirus 2 by RT PCR NEGATIVE NEGATIVE Final    Comment: (NOTE) SARS-CoV-2 target nucleic acids are NOT DETECTED.  The SARS-CoV-2 RNA is generally detectable in upper respiratoy specimens during the acute phase of infection. The lowest concentration of SARS-CoV-2 viral copies this assay can detect is 131 copies/mL. A negative result does not preclude SARS-Cov-2 infection and should not be used as the sole basis for treatment or other patient management decisions. A negative result may occur with  improper specimen collection/handling, submission of specimen other than nasopharyngeal swab, presence of viral mutation(s) within the areas targeted by this assay, and inadequate number of viral copies (<131 copies/mL). A negative result must  be combined with clinical observations, patient history, and epidemiological information. The expected result is Negative.  Fact Sheet for Patients:  https://www.moore.com/  Fact Sheet for Healthcare Providers:  https://www.young.biz/  This test is no t yet approved or cleared by the [place] FDA and  has been authorized for detection and/or diagnosis of SARS-CoV-2 by FDA under an Emergency Use Authorization (EUA). This EUA will remain  in effect (meaning this test can be used) for the duration of the COVID-19 declaration under Section 564(b)(1) of the Act, 21 U.S.C. section 360bbb-3(b)(1),  unless the authorization is terminated or revoked sooner.     Influenza A by PCR NEGATIVE NEGATIVE Final   Influenza B by PCR NEGATIVE NEGATIVE Final    Comment: (NOTE) The Xpert Xpress SARS-CoV-2/FLU/RSV assay is intended as an aid in  the diagnosis of influenza from Nasopharyngeal swab specimens and  should not be used as a sole basis for treatment. Nasal washings and  aspirates are unacceptable for Xpert Xpress SARS-CoV-2/FLU/RSV  testing.  Fact Sheet for Patients: https://www.moore.com/  Fact Sheet for Healthcare Providers: https://www.young.biz/  This test is not yet approved or cleared by the [place] FDA and  has been authorized for detection and/or diagnosis of SARS-CoV-2 by  FDA under an Emergency Use Authorization (EUA). This EUA will remain  in effect (meaning this test can be used) for the duration of the  Covid-19 declaration under Section 564(b)(1) of the Act, 21  U.S.C. section 360bbb-3(b)(1), unless the authorization is  terminated or revoked. Performed at [facility], [address], [place], [place] [address]          Radiology Studies: DG Chest 2 View  Result Date: 06/24/2020 CLINICAL DATA:  Leg swelling EXAM: CHEST - 2 VIEW COMPARISON:  June 11, 2020 FINDINGS: The cardiomediastinal silhouette is unchanged and enlarged in contour. No pleural effusion. No pneumothorax. Unchanged linear opacity in the RIGHT lung base, likely atelectasis/scar. Visualized abdomen is unremarkable. Multilevel degenerative changes of the thoracic spine. IMPRESSION: No acute cardiopulmonary abnormality. Electronically Signed   By: Meda Klinefelter MD   On: 06/24/2020 13:17        Scheduled Meds:  amoxicillin-clavulanate  1 tablet Oral BID   apixaban  5 mg Oral BID   atorvastatin  10 mg Oral Daily   carvedilol  6.25 mg Oral BID WC   diltiazem  180 mg Oral Daily   furosemide  40 mg Intravenous Q12H    irbesartan  75 mg Oral Daily   levothyroxine  100 mcg Oral Daily   sodium chloride flush  3 mL Intravenous Q12H   Continuous Infusions:  sodium chloride       LOS: 0 days    Time spent:     Thalia Party, MD [facility] Pager [phone]  If 7PM-7AM, please contact night-coverage [email or web address] Password [facility] 06/25/2020, 7:37 AM

## 2020-06-26 DIAGNOSIS — I5033 Acute on chronic diastolic (congestive) heart failure: Secondary | ICD-10-CM | POA: Diagnosis not present

## 2020-06-26 LAB — BASIC METABOLIC PANEL
Anion gap: 10 (ref 5–15)
BUN: 19 mg/dL (ref 8–23)
CO2: 31 mmol/L (ref 22–32)
Calcium: 8.9 mg/dL (ref 8.9–10.3)
Chloride: 97 mmol/L — ABNORMAL LOW (ref 98–111)
Creatinine, Ser: 0.96 mg/dL (ref 0.44–1.00)
GFR calc non Af Amer: 54 mL/min — ABNORMAL LOW (ref >60)
Glucose, Bld: 94 mg/dL (ref 70–99)
Potassium: 3.6 mmol/L (ref 3.5–5.1)
Sodium: 138 mmol/L (ref 135–145)

## 2020-06-26 LAB — CBC
HCT: 35 % — ABNORMAL LOW (ref 36.0–46.0)
Hemoglobin: 11.5 g/dL — ABNORMAL LOW (ref 12.0–15.0)
MCH: 31.7 pg (ref 26.0–34.0)
MCHC: 32.9 g/dL (ref 30.0–36.0)
MCV: 96.4 fL (ref 80.0–100.0)
Platelets: 282 10*3/uL (ref 150–400)
RBC: 3.63 MIL/uL — ABNORMAL LOW (ref 3.87–5.11)
RDW: 13.8 % (ref 11.5–15.5)
WBC: 8.1 10*3/uL (ref 4.0–10.5)
nRBC: 0 % (ref 0.0–0.2)

## 2020-06-26 MED ORDER — HYDROCORTISONE 1 % EX OINT
TOPICAL_OINTMENT | CUTANEOUS | Status: DC | PRN
  Filled 2020-06-26: qty 28

## 2020-06-26 MED ORDER — FUROSEMIDE 10 MG/ML IJ SOLN
40.0000 mg | Freq: Every day | INTRAMUSCULAR | Status: DC
  Administered 2020-06-27 – 2020-07-01 (×5): 40 mg via INTRAVENOUS
  Filled 2020-06-26 (×4): qty 4

## 2020-06-26 NOTE — Progress Notes (Signed)
PROGRESS NOTE    [name]  ZOX:[id number] DOB: 04/28/1932 DOA: 06/24/2020 PCP: Everlean CherryWhyte, Thomas M, MD (Confirm with patient/family/NH records and if not entered, [facility]. "No PCP" if truly none.)   Brief Narrative: (Start on day 1 of progress note - keep it brief and live) Patient is a 86 y.o.femalewith medical history significant forCAD, chronic diastolic CHF (EF 81-19%65-70%, G2 DD by TTE 10/31/2019), persistent atrial fibrillation on Eliquis, RBBB, hypertension, hyperlipidemia, andosteoarthritis and chronic back painwho presents to the ED for evaluation of progressive lower extremity edema and worsening dyspnea with exertion.  Chest x-ray showed cardiomegaly without focal consolidation, edema or effusion.  Patient also reported a recent cat bite on her right lower extremity.  Patient is given IV Lasix and started on oral Augmentin and admitted for further managemen patient is also complaining of gross hematuria.  UA is ordered and will hold Eliquis at this time because of gross hematuria.  UA came back positive for UTI   Assessment & Plan:   Principal Problem:   Acute on chronic diastolic (congestive) heart failure (HCC) Active Problems:   Essential hypertension   Hypercholesterolemia   CAD (coronary artery disease)   Hypothyroidism   Persistent atrial fibrillation (HCC)   Cat bite of right lower leg   Acute on chronic diastolic CHF exacerbation. Continue IV Lasix 40 mg daily Monitor daily weights with strict intakes and output Echocardiogram ordered Oxygen supplementation with nasal cannula if needed  Asymptomatic hypotension Patient became hypotensive with a blood pressure of 85/50 today.  Hold IV Lasix for today and blood pressure is improved to 95/60 at this time.  Patient denies any symptoms.  Continue to monitor.  Gross hematuria  Patient mentioned that she is having gross hematuria for the last 3 to 4 days without any pain but she is  complaining of mild burning of micturition.  Hold Eliquis.  Urinalysis ordered.  Hematuria is improved today but UA was positive for UTI  Urinary tract infection Continue Augmentin.  Cat bite to right lower extremity: Reports recurrent cat bite to right lower extremity 2 days prior to admission. Does not appear to be actively infected. Continue prophylactic Augmentin at least 3 days.  Wound covered with Mepilex dressing  Persistent atrial fibrillation Stable Continue Coreg and diltiazem.  Hold Eliquis because of gross hematuria.  Consider restarting Eliquis tomorrow if there is no hematuria.  Coronary artery disease Stable and denies any chest pain.  Continue Coreg and statin   Hyperlipidemia Continue Lipitor  Hypertension Stable.  Continue diltiazem, Coreg and telmisartan  Generalized weakness PT/OT consult placed for evaluation and treatment      DVT prophylaxis: Holding Eliquis at this time because of hematuria.  Knee SCDs Code Status: DNR Family Communication: Patient's son present at the bedside Disposition Plan: PT/OT recommended home with home health services  Consultants:   None  Procedures: (Don't include imaging studies which can be auto populated. Include things that cannot be auto populated i.e. Echo, Carotid and venous dopplers, Foley, Bipap, HD, tubes/drains, wound vac, central lines etc)    Antimicrobials: (specify start and planned stop date. Auto populated tables are space occupying and do not give end dates)  Augmentin   Subjective: Patient evaluated at the bedside this morning.  Patient's son is also present at the bedside.  Patient states that how she feels much better today.  Patient admits of having mild burning in urination but states that her lower extremity  edema is improving and her hematuria is also much better today.  Patient also denies fever, chills, chest pain, nausea, vomiting, abdominal pain and  diarrhea.  Objective: Vitals:   06/26/20 0249 06/26/20 0250 06/26/20 0737 06/26/20 1135  BP:      Pulse: 95 84    Resp: 16 17    Temp: 97.8 F (36.6 C)  97.6 F (36.4 C) 98.4 F (36.9 C)  TempSrc: Oral  Oral Oral  SpO2: 96% 97%    Weight: 74.3 kg     Height:        Intake/Output Summary (Last 24 hours) at 06/26/2020 1521 Last data filed at 06/26/2020 1247 Gross per 24 hour  Intake 720 ml  Output 1650 ml  Net -930 ml   Filed Weights   06/24/20 2146 06/25/20 0233 06/26/20 0249  Weight: 76 kg 75.3 kg 74.3 kg    Examination:  General exam: Appears calm and comfortable  Respiratory system: Clear to auscultation. Respiratory effort normal. Cardiovascular system: S1 & S2 heard, RRR. No JVD, murmurs, rubs, gallops or clicks.  2+ bilateral lower extremity edema Gastrointestinal system: Abdomen is nondistended, soft and nontender. No organomegaly or masses felt. Normal bowel sounds heard. Central nervous system: Alert and oriented. No focal neurological deficits. Extremities: Symmetric 5 x 5 power. Mepilex dressing on bilateral lower extremities. Skin: Small wounds on both lower extremities because of cat bite covered with clean Mepilex dressing. Psychiatry: Judgement and insight appear normal. Mood & affect appropriate.   Data Reviewed: I have personally reviewed following labs and imaging studies  CBC: Recent Labs  Lab 06/24/20 1340 06/25/20 0811 06/26/20 0606  WBC 7.4 8.3 8.1  NEUTROABS 5.6  --   --   HGB 12.3 11.8* 11.5*  HCT 35.9* 35.0* 35.0*  MCV 96.8 97.0 96.4  PLT 289 296 282   Basic Metabolic Panel: Recent Labs  Lab 06/24/20 1340 06/25/20 0811 06/26/20 0606  NA 135 138 138  K 4.2 3.6 3.6  CL 99 98 97*  CO2 26 30 31   GLUCOSE 105* 140* 94  BUN 26* 16 19  CREATININE 0.98 1.06* 0.96  CALCIUM 8.9 8.8* 8.9  MG  --  1.8  --    GFR: Estimated Creatinine Clearance: 45.5 mL/min (by C-G formula based on SCr of 0.96 mg/dL). Liver Function Tests: No results  for input(s): AST, ALT, ALKPHOS, BILITOT, PROT, ALBUMIN in the last 168 hours. No results for input(s): LIPASE, AMYLASE in the last 168 hours. No results for input(s): AMMONIA in the last 168 hours. Coagulation Profile: No results for input(s): INR, PROTIME in the last 168 hours. Cardiac Enzymes: No results for input(s): CKTOTAL, CKMB, CKMBINDEX, TROPONINI in the last 168 hours. BNP (last 3 results) No results for input(s): PROBNP in the last 8760 hours. HbA1C: No results for input(s): HGBA1C in the last 72 hours. CBG: No results for input(s): GLUCAP in the last 168 hours. Lipid Profile: No results for input(s): CHOL, HDL, LDLCALC, TRIG, CHOLHDL, LDLDIRECT in the last 72 hours. Thyroid Function Tests: Recent Labs    06/25/20 0811  TSH 8.527*   Anemia Panel: No results for input(s): VITAMINB12, FOLATE, FERRITIN, TIBC, IRON, RETICCTPCT in the last 72 hours. Sepsis Labs: No results for input(s): PROCALCITON, LATICACIDVEN in the last 168 hours.  Recent Results (from the past 240 hour(s))  Respiratory Panel by RT PCR (Flu A&B, Covid) - Nasopharyngeal Swab     Status: None   Collection Time: 06/24/20  1:40 PM   Specimen: Nasopharyngeal Swab  Result Value Ref Range Status   SARS Coronavirus 2 by RT PCR NEGATIVE NEGATIVE Final    Comment: (NOTE) SARS-CoV-2 target nucleic acids are NOT DETECTED.  The SARS-CoV-2 RNA is generally detectable in upper respiratoy specimens during the acute phase of infection. The lowest concentration of SARS-CoV-2 viral copies this assay can detect is 131 copies/mL. A negative result does not preclude SARS-Cov-2 infection and should not be used as the sole basis for treatment or other patient management decisions. A negative result may occur with  improper specimen collection/handling, submission of specimen other than nasopharyngeal swab, presence of viral mutation(s) within the areas targeted by this assay, and inadequate number of viral copies (<131  copies/mL). A negative result must be combined with clinical observations, patient history, and epidemiological information. The expected result is Negative.  Fact Sheet for Patients:  https://www.moore.com/  Fact Sheet for Healthcare Providers:  https://www.young.biz/  This test is no t yet approved or cleared by the [place] FDA and  has been authorized for detection and/or diagnosis of SARS-CoV-2 by FDA under an Emergency Use Authorization (EUA). This EUA will remain  in effect (meaning this test can be used) for the duration of the COVID-19 declaration under Section 564(b)(1) of the Act, 21 U.S.C. section 360bbb-3(b)(1), unless the authorization is terminated or revoked sooner.     Influenza A by PCR NEGATIVE NEGATIVE Final   Influenza B by PCR NEGATIVE NEGATIVE Final    Comment: (NOTE) The Xpert Xpress SARS-CoV-2/FLU/RSV assay is intended as an aid in  the diagnosis of influenza from Nasopharyngeal swab specimens and  should not be used as a sole basis for treatment. Nasal washings and  aspirates are unacceptable for Xpert Xpress SARS-CoV-2/FLU/RSV  testing.  Fact Sheet for Patients: https://www.moore.com/  Fact Sheet for Healthcare Providers: https://www.young.biz/  This test is not yet approved or cleared by the [place] FDA and  has been authorized for detection and/or diagnosis of SARS-CoV-2 by  FDA under an Emergency Use Authorization (EUA). This EUA will remain  in effect (meaning this test can be used) for the duration of the  Covid-19 declaration under Section 564(b)(1) of the Act, 21  U.S.C. section 360bbb-3(b)(1), unless the authorization is  terminated or revoked. Performed at [facility], [address] [place], [place] [address]          Radiology Studies: ECHOCARDIOGRAM COMPLETE  Result Date: 06/25/2020    ECHOCARDIOGRAM REPORT   Patient Name:    [name] [facility] Date of Exam: 06/25/2020 Medical Rec #:  [id number]      Height:       70.0 in Accession #:    [id number]     Weight:       165.9 lb Date of Birth:  [birth year]       BSA:          1.928 m Patient Age:    86 years       BP:           104/57 mmHg Patient Gender: F              HR:           78 bpm. Exam Location:  Inpatient Procedure: 2D Echo, Cardiac Doppler and Color Doppler Indications:    I50.33 Acute on chronic diastolic (congestive) heart failure  History:        Patient has prior history of Echocardiogram examinations, most  recent 10/31/2019. CAD, Pulmonary HTN, Arrythmias:RBBB; Risk                 Factors:Hypertension, Dyslipidemia and Former Smoker.                 Hypothyroidism.  Sonographer:    Elmarie Shiley Dance Referring Phys: [id number] VISHAL R PATEL IMPRESSIONS  1. Inferior basal hypokinesis. Left ventricular ejection fraction, by estimation, is 55%. The left ventricle has normal function. The left ventricle has no regional wall motion abnormalities. Left ventricular diastolic parameters are indeterminate.  2. Right ventricular systolic function is normal. The right ventricular size is normal. There is normal pulmonary artery systolic pressure.  3. Left atrial size was moderately dilated.  4. Right atrial size was mildly dilated.  5. The mitral valve is normal in structure. Mild mitral valve regurgitation. No evidence of mitral stenosis.  6. The aortic valve is normal in structure. Aortic valve regurgitation is not visualized. No aortic stenosis is present.  7. The inferior vena cava is dilated in size with >50% respiratory variability, suggesting right atrial pressure of 8 mmHg. FINDINGS  Left Ventricle: Inferior basal hypokinesis. Left ventricular ejection fraction, by estimation, is 55%. The left ventricle has normal function. The left ventricle has no regional wall motion abnormalities. The left ventricular internal cavity size was normal in size. There is no left ventricular  hypertrophy. Left ventricular diastolic parameters are indeterminate. Right Ventricle: The right ventricular size is normal. No increase in right ventricular wall thickness. Right ventricular systolic function is normal. There is normal pulmonary artery systolic pressure. The tricuspid regurgitant velocity is 2.58 m/s, and  with an assumed right atrial pressure of 8 mmHg, the estimated right ventricular systolic pressure is 34.6 mmHg. Left Atrium: Left atrial size was moderately dilated. Right Atrium: Right atrial size was mildly dilated. Pericardium: There is no evidence of pericardial effusion. Mitral Valve: The mitral valve is normal in structure. Mild mitral valve regurgitation. No evidence of mitral valve stenosis. Tricuspid Valve: The tricuspid valve is normal in structure. Tricuspid valve regurgitation is mild . No evidence of tricuspid stenosis. Aortic Valve: The aortic valve is normal in structure. Aortic valve regurgitation is not visualized. No aortic stenosis is present. Pulmonic Valve: The pulmonic valve was normal in structure. Pulmonic valve regurgitation is not visualized. No evidence of pulmonic stenosis. Aorta: The aortic root is normal in size and structure. Venous: The inferior vena cava is dilated in size with greater than 50% respiratory variability, suggesting right atrial pressure of 8 mmHg. IAS/Shunts: No atrial level shunt detected by color flow Doppler.  LEFT VENTRICLE PLAX 2D LVIDd:         4.30 cm LVIDs:         3.61 cm LV PW:         1.13 cm LV IVS:        0.79 cm LVOT diam:     1.80 cm LV SV:         45 LV SV Index:   23 LVOT Area:     2.54 cm  RIGHT VENTRICLE          IVC RV Basal diam:  2.77 cm  IVC diam: 2.57 cm TAPSE (M-mode): 1.6 cm LEFT ATRIUM             Index       RIGHT ATRIUM           Index LA diam:        4.40 cm 2.28 cm/m  RA Area:     21.40 cm LA Vol (A2C):   74.5 ml 38.64 ml/m RA Volume:   72.00 ml  37.35 ml/m LA Vol (A4C):   59.2 ml 30.71 ml/m LA Biplane Vol: 67.3  ml 34.91 ml/m  AORTIC VALVE LVOT Vmax:   96.35 cm/s LVOT Vmean:  62.650 cm/s LVOT VTI:    0.176 m  AORTA Ao Root diam: 2.90 cm Ao Asc diam:  2.90 cm MITRAL VALVE                TRICUSPID VALVE MV Area (PHT): 3.05 cm     TR Peak grad:   26.6 mmHg MV Decel Time: 249 msec     TR Vmax:        258.00 cm/s MV E velocity: 150.00 cm/s                             SHUNTS                             Systemic VTI:  0.18 m                             Systemic Diam: 1.80 cm Charlton Haws MD Electronically signed by Charlton Haws MD Signature Date/Time: 06/25/2020/9:52:34 AM    Final         Scheduled Meds: . amoxicillin-clavulanate  1 tablet Oral BID  . apixaban  5 mg Oral BID  . atorvastatin  10 mg Oral Daily  . carvedilol  6.25 mg Oral BID WC  . diltiazem  180 mg Oral Daily  . furosemide  40 mg Intravenous Q12H  . irbesartan  75 mg Oral Daily  . levothyroxine  100 mcg Oral Daily  . sodium chloride flush  3 mL Intravenous Q12H   Continuous Infusions: . sodium chloride       LOS: 1 day    Time spent:     Thalia Party, MD [facility] Pager [phone]  If 7PM-7AM, please contact night-coverage [email or web address] Password  06/26/2020, 3:21 PM

## 2020-06-26 NOTE — Progress Notes (Signed)
Physical Therapy Treatment Patient Details Name: [name] MRN: [id number] DOB: 1932-07-13 Today's Date: 06/26/2020    History of Present Illness [name] is a 86 y.o. female with medical history significant for CAD, chronic diastolic CHF (EF 32-99%, G2 DD by TTE 10/31/2019), persistent atrial fibrillation on Eliquis, RBBB, hypertension, hyperlipidemia, and osteoarthritis and chronic back pain who presents to the ED for evaluation of progressive lower extremity edema.    PT Comments    Pt admitted with above diagnosis. Pt attempted to stand at EOB and only min guard assist to stand but dizzy upon standing.  BP intiailly at rest 90/62.  After standing, BP 76/49 and then 70/54 a minute later. Laid pt back down and notified nursing. Will continue to progress pt as able.   Pt currently with functional limitations due to balance and endurance deficits. Pt will benefit from skilled PT to increase their independence and safety with mobility to allow discharge to the venue listed below.     Follow Up Recommendations  Home health PT;Supervision/Assistance - 24 hour (HHOT, HH aide)     Equipment Recommendations   (rollator)    Recommendations for Other Services       Precautions / Restrictions Precautions Precautions: Fall Restrictions Weight Bearing Restrictions: No    Mobility  Bed Mobility Overal bed mobility: Needs Assistance Bed Mobility: Rolling;Supine to Sit;Sit to Supine Rolling: Mod assist   Supine to sit: Mod assist;HOB elevated Sit to supine: Mod assist;HOB elevated   General bed mobility comments: assist moving B LE's  Transfers Overall transfer level: Needs assistance Equipment used: 4-wheeled walker Transfers: Sit to/from [employer] Sit to Stand: Min guard Stand pivot transfers: Min guard       General transfer comment: Intially, BP was 90/62.  Other VSS throghtout.  Pt stood up and was putting a diaper on as pt has incontinence.  Pt  reported she felt dizzy therefore had pt sit down and took BP.  BP was 76/49.  took it again and it was 70/54 therefore laid pt back down and notified the nurse.    Ambulation/Gait                 Stairs             Wheelchair Mobility    Modified Rankin (Stroke Patients Only)       Balance Overall balance assessment: Needs assistance Sitting-balance support: Single extremity supported Sitting balance-Leahy Scale: Good     Standing balance support: Bilateral upper extremity supported;During functional activity Standing balance-Leahy Scale: Poor Standing balance comment: relies on UE support                            Cognition Arousal/Alertness: Awake/alert Behavior During Therapy: WFL for tasks assessed/performed Overall Cognitive Status: Within Functional Limits for tasks assessed                                        Exercises      General Comments        Pertinent Vitals/Pain Pain Assessment: No/denies pain    Home Living                      Prior Function            PT Goals (current goals can now be found  in the care plan section) Acute Rehab PT Goals Patient Stated Goal: Decrease edema to move better Progress towards PT goals: Progressing toward goals    Frequency    Min 3X/week      PT Plan Current plan remains appropriate    Co-evaluation              AM-PAC PT "6 Clicks" Mobility   Outcome Measure  Help needed turning from your back to your side while in a flat bed without using bedrails?: A Lot Help needed moving from lying on your back to sitting on the side of a flat bed without using bedrails?: A Lot Help needed moving to and from a bed to a chair (including a wheelchair)?: A Little Help needed standing up from a chair using your arms (e.g., wheelchair or bedside chair)?: A Little Help needed to walk in hospital room?: Total Help needed climbing 3-5 steps with a railing? :  Total 6 Click Score: 12    End of Session Equipment Utilized During Treatment: Gait belt Activity Tolerance: Patient tolerated treatment well Patient left: in bed;with call bell/phone within reach;with bed alarm set Nurse Communication: Mobility status (Orthostasis with activity) PT Visit Diagnosis: Muscle weakness (generalized) (M62.81)     Time: 7672-0947 PT Time Calculation (min) (ACUTE ONLY): 20 min  Charges:  $Therapeutic Activity: 8-22 mins                     Joaopedro Eschbach W,PT Acute Rehabilitation Services Pager:  [phone]  Office:  [phone]     Berline Lopes 06/26/2020, 2:34 PM

## 2020-06-26 NOTE — Plan of Care (Signed)
  Problem: Clinical Measurements: Goal: Respiratory complications will improve Outcome: Progressing   

## 2020-06-26 NOTE — Discharge Instructions (Signed)

## 2020-06-27 DIAGNOSIS — I5033 Acute on chronic diastolic (congestive) heart failure: Secondary | ICD-10-CM | POA: Diagnosis not present

## 2020-06-27 LAB — URINALYSIS, COMPLETE (UACMP) WITH MICROSCOPIC
Bacteria, UA: NONE SEEN
Bilirubin Urine: NEGATIVE
Glucose, UA: NEGATIVE mg/dL
Ketones, ur: NEGATIVE mg/dL
Leukocytes,Ua: NEGATIVE
Nitrite: NEGATIVE
Protein, ur: NEGATIVE mg/dL
Specific Gravity, Urine: 1.005 (ref 1.005–1.030)
pH: 7 (ref 5.0–8.0)

## 2020-06-27 LAB — BASIC METABOLIC PANEL
Anion gap: 9 (ref 5–15)
BUN: 24 mg/dL — ABNORMAL HIGH (ref 8–23)
CO2: 29 mmol/L (ref 22–32)
Calcium: 9 mg/dL (ref 8.9–10.3)
Chloride: 97 mmol/L — ABNORMAL LOW (ref 98–111)
Creatinine, Ser: 1.02 mg/dL — ABNORMAL HIGH (ref 0.44–1.00)
GFR calc non Af Amer: 50 mL/min — ABNORMAL LOW (ref >60)
Glucose, Bld: 99 mg/dL (ref 70–99)
Potassium: 4.4 mmol/L (ref 3.5–5.1)
Sodium: 135 mmol/L (ref 135–145)

## 2020-06-27 LAB — CBC
HCT: 36.5 % (ref 36.0–46.0)
Hemoglobin: 12.4 g/dL (ref 12.0–15.0)
MCH: 33.3 pg (ref 26.0–34.0)
MCHC: 34 g/dL (ref 30.0–36.0)
MCV: 98.1 fL (ref 80.0–100.0)
Platelets: 240 10*3/uL (ref 150–400)
RBC: 3.72 MIL/uL — ABNORMAL LOW (ref 3.87–5.11)
RDW: 13.6 % (ref 11.5–15.5)
WBC: 8.4 10*3/uL (ref 4.0–10.5)
nRBC: 0 % (ref 0.0–0.2)

## 2020-06-27 MED ORDER — DIPHENHYDRAMINE HCL 25 MG PO CAPS
25.0000 mg | ORAL_CAPSULE | Freq: Four times a day (QID) | ORAL | Status: DC | PRN
  Administered 2020-06-27 – 2020-07-03 (×2): 25 mg via ORAL
  Filled 2020-06-27 (×2): qty 1

## 2020-06-27 MED ORDER — SODIUM CHLORIDE 0.9 % IV SOLN
1.0000 g | INTRAVENOUS | Status: DC
  Administered 2020-06-27 – 2020-06-28 (×2): 1 g via INTRAVENOUS
  Filled 2020-06-27 (×2): qty 10

## 2020-06-27 NOTE — Progress Notes (Signed)
Occupational Therapy Treatment Patient Details Name: [name] MRN: [id number] DOB: 03/27/1932 Today's Date: 06/27/2020    History of present illness [name] is a 86 y.o. female with medical history significant for CAD, chronic diastolic CHF (EF 70-62%, G2 DD by TTE 10/31/2019), persistent atrial fibrillation on Eliquis, RBBB, hypertension, hyperlipidemia, and osteoarthritis and chronic back pain who presents to the ED for evaluation of progressive lower extremity edema.   OT comments  Pt not requiring physical assist to achieve sitting EOB, but once there became slightly lightheaded with BP of 96/76. Completed UB bathing and grooming in sitting, but did not progress OOB.   Follow Up Recommendations  Home health OT    Equipment Recommendations  None recommended by OT    Recommendations for Other Services      Precautions / Restrictions Precautions Precautions: Fall Precaution Comments: watch BP       Mobility Bed Mobility Overal bed mobility: Needs Assistance Bed Mobility: Rolling;Sidelying to Sit;Sit to Supine Rolling: Supervision Sidelying to sit: Supervision   Sit to supine: Supervision      Transfers                 General transfer comment: pt with symptomatic hypotension sitting EOB with BP of 96/76    Balance Overall balance assessment: Needs assistance Sitting-balance support: Single extremity supported Sitting balance-Leahy Scale: Good                                     ADL either performed or assessed with clinical judgement   ADL Overall ADL's : Needs assistance/impaired     Grooming: Oral care;Brushing hair;Sitting;Set up   Upper Body Bathing: Minimal assistance;Sitting                                   Vision       Perception     Praxis      Cognition Arousal/Alertness: Awake/alert Behavior During Therapy: WFL for tasks assessed/performed Overall Cognitive Status: Within Functional Limits  for tasks assessed                                          Exercises     Shoulder Instructions       General Comments      Pertinent Vitals/ Pain       Pain Assessment: No/denies pain  Home Living                                          Prior Functioning/Environment              Frequency  Min 2X/week        Progress Toward Goals  OT Goals(current goals can now be found in the care plan section)  Progress towards OT goals: Progressing toward goals  Acute Rehab OT Goals Time For Goal Achievement: 07/09/20 Potential to Achieve Goals: Good  Plan Discharge plan remains appropriate    Co-evaluation                 AM-PAC OT "6 Clicks" Daily Activity     Outcome Measure   Help  from another person eating meals?: None Help from another person taking care of personal grooming?: A Little Help from another person toileting, which includes using toliet, bedpan, or urinal?: A Little Help from another person bathing (including washing, rinsing, drying)?: A Little Help from another person to put on and taking off regular upper body clothing?: A Little Help from another person to put on and taking off regular lower body clothing?: A Little 6 Click Score: 19    End of Session    OT Visit Diagnosis: Muscle weakness (generalized) (M62.81);History of falling (Z91.81);Pain   Activity Tolerance Treatment limited secondary to medical complications (Comment) (BP)   Patient Left in bed;with call bell/phone within reach   Nurse Communication          Time: 1341-1411 OT Time Calculation (min): 30 min  Charges: OT General Charges $OT Visit: 1 Visit OT Treatments $Self Care/Home Management : 23-37 mins  Martie Round, OTR/L Acute Rehabilitation Services Pager: [phone] Office: [phone]   Evern Bio 06/27/2020, 2:11 PM

## 2020-06-27 NOTE — TOC Transition Note (Addendum)
Transition of Care [facility]) - CM/SW Discharge Note   Patient Details  Name: [name] MRN: [id number] Date of Birth: 02-Dec-1931  Transition of Care [facility]) CM/SW Contact:  Leone Haven, RN Phone Number: 06/27/2020, 5:22 PM   Clinical Narrative:    NCM spoke with patient at bedside , offered choice , she states [name] is ok with her. NCM made referral to [facility] with [facility] for HHPT, HHRN, HHOT.  He is able to take referral .  Soc will begin 24 to 48 hrs post dc. She also needs rollator, NCM made referral to [facility] with [employer] for rollator, this will be brought up to room. Need orders for HHRN, HHPT, HHOT.   Final next level of care: Home w Home Health Services Barriers to Discharge: No Barriers Identified   Patient Goals and CMS Choice Patient states their goals for this hospitalization and ongoing recovery are:: get better CMS Medicare.gov Compare Post Acute Care list provided to:: Patient Choice offered to / list presented to : Patient  Discharge Placement                       Discharge Plan and Services                DME Arranged: Walker rolling with seat DME Agency: [employer] Date DME Agency Contacted: 06/27/20 Time DME Agency Contacted: 1721 Representative spoke with at DME Agency: Silvio Pate HH Arranged: RN, Disease Management, PT, OT HH Agency: [facility] Date [facility] Agency Contacted: 06/27/20 Time HH Agency Contacted: 1721 Representative spoke with at [facility] Agency: Kandee Keen  Social Determinants of Health (SDOH) Interventions     Readmission Risk Interventions No flowsheet data found.

## 2020-06-27 NOTE — Progress Notes (Signed)
PROGRESS NOTE    [name]  YIR:[id number] DOB: 01/22/1932 DOA: 06/24/2020 PCP: Everlean Cherry, MD (Confirm with patient/family/NH records and if not entered, this HAS to be entered at [facility] point of entry. "No PCP" if truly none.)   Brief Narrative: (Start on day 1 of progress note - keep it brief and live) Patientis a 86 y.o.femalewith medical history significant forCAD, chronic diastolic CHF (EF 50-09%, G2 DD by TTE 10/31/2019), persistent atrial fibrillation on Eliquis, RBBB, hypertension, hyperlipidemia, andosteoarthritis and chronic back painwho presents to the ED for evaluation of progressive lower extremity edemaand worsening dyspnea with exertion. Chest x-ray showed cardiomegaly without focal consolidation, edema or effusion. Patient also reported a recent cat bite on her right lower extremity. Patient is given IV Lasix and started on oral Augmentin and admitted for further managemenpatient is also complaining of gross hematuria. UA is ordered and will hold Eliquis at this time because of gross hematuria.  UA came back positive for UTI.  Today hematuria is resolved and edema of lower extremities is slightly improved but patient is having severe discomfort and tenderness in suprapubic area along with burning micturition therefore plan is to change oral Augmentin to IV ceftriaxone secondary to worsening burning with urination and suprapubic pain.   Assessment & Plan:   Principal Problem:   Acute on chronic diastolic (congestive) heart failure (HCC) Active Problems: Urinary tract infection Hematuria   Essential hypertension   Hypercholesterolemia   CAD (coronary artery disease)   Hypothyroidism   Persistent atrial fibrillation (HCC)   Cat bite of right lower leg  Acute on chronic  CHF exacerbation. Patient 3+ edema and weeping legs at the time of admission and after diuresis edema improved but it is still 2+ in both lower extremities.  Yesterday diuretic was ordered  because patient remained severely hypotensive and her lowest blood pressure was 53/40.  Blood pressure is improved today and will resume diuretics.  Patient is not in respiratory distress and saturating well on room air at rest but become severely dyspneic with mild exertion.  Patient will need a walk test at the time of discharge to determine if he needs any oxygen at home. Continue IV Lasix 40 mg daily Monitor daily weights with strict intakes and output Low-salt diet Echocardiogram done and ejection fraction is decreased to 55% from 5 to 7% on the last echocardiogram done in February, 2021.  There is also mild tricuspid wall regurgitation and inferior vena cava is dilated in size with greater than 50% respiratory variability and right atrial pressure of 8 mm Hg. Oxygen supplementation with nasal cannula if needed  Asymptomatic hypotension Patient became hypotensive with a blood pressure of 85/50  yesterday.  Hypotension is improved today and will continue IV Lasix.  Continue to monitor.  Gross hematuria Patient was complaining of gross hematuria for the last 3 to 4 days without any pain.  Hematuria is improved and will resume Eliquis today.   Urinary tract infection UA was positive for hematuria and urinary tract infection.  Patient was continued on Augmentin but today patient is complaining of severe burning micturition with suprapubic discomfort and tenderness.  Discontinue Augmentin and patient started on IV ceftriaxone.  Urine culture ordered   Cat bite to right lower extremity: Reports recurrent cat bite to right lower extremity 2 days prior to admission. Does not appear to be actively infected.  Patient was started on prophylactic Augmentin.  Augmentin discontinued today and patient started on IV ceftriaxone because of worsening UTI.Wound  covered with Mepilex dressing  Persistent atrial fibrillation Stable Continue Coreg and diltiazem. Hold Eliquis restarted today.    Coronary artery disease Stable and denies any chest pain. Continue Coreg and statin   Hyperlipidemia Continue Lipitor  Hypertension Stable. Continue diltiazem, Coreg and telmisartan  Generalized weakness PT/OT consult placed for evaluation and treatment and they recommended home health physical therapy and Rollator.     DVT prophylaxis: Eliquis Code Status: DNR Family Communication: Patient's son is present at the bedside and plan is discussed with him in detail. Disposition Plan: Patient will be most probably discharged home in 1 to 2 days.   Consultants:   None  Procedures: (Don't include imaging studies which can be auto populated. Include things that cannot be auto populated i.e. Echo, Carotid and venous dopplers, Foley, Bipap, HD, tubes/drains, wound vac, central lines etc)  Echocardiogram  Antimicrobials: (specify start and planned stop date. Auto populated tables are space occupying and do not give end dates)  Augmentin, ceftriaxone   Subjective: Patient is seen and evaluated at the bedside this morning.  Patient's son is also present at the bedside.  Patient states that she did not see any blood in her urine today she is complaining of severe discomfort and pain in suprapubic region along with worsening burning of micturition.  Patient also complaining of exertional dyspnea.  Patient otherwise denies fever, chills, chest pain, nausea, vomiting, diarrhea and constipation.  Objective: Vitals:   06/27/20 0200 06/27/20 0205 06/27/20 0400 06/27/20 0745  BP: 112/64  (!) 131/54 128/71  Pulse:  66 62 87  Resp:  17 (!) 23 18  Temp:   98.9 F (37.2 C) 98.1 F (36.7 C)  TempSrc:    Oral  SpO2:   94% 93%  Weight:   75 kg   Height:        Intake/Output Summary (Last 24 hours) at 06/27/2020 1307 Last data filed at 06/27/2020 0500 Gross per 24 hour  Intake 780 ml  Output 970 ml  Net -190 ml   Filed Weights   06/25/20 0233 06/26/20 0249 06/27/20 0400   Weight: 75.3 kg 74.3 kg 75 kg    Examination:  General exam: Appears sick but not in acute distress. Respiratory system: Clear to auscultation. Respiratory effort normal. Cardiovascular system: S1 & S2 heard, RRR. No JVD, murmurs, rubs, gallops or clicks.  2+ bilateral lower extremity edema. Gastrointestinal system: Abdomen is nondistended, soft and positive for tenderness in suprapubic region. No organomegaly or masses felt. Normal bowel sounds heard. Central nervous system: Alert and oriented. No focal neurological deficits. Extremities: 2+ edema in bilateral lower extremities wounds from cat bite on both lower extremities are healing well and no signs of infection. Skin: Wounds on bilateral lower extremities secondary to cat bite are healing well and covered with Mepilex dressing. Psychiatry: Judgement and insight appear normal. Mood & affect appropriate.     Data Reviewed: I have personally reviewed following labs and imaging studies  CBC: Recent Labs  Lab 06/24/20 1340 06/25/20 0811 06/26/20 0606 06/27/20 0325  WBC 7.4 8.3 8.1 8.4  NEUTROABS 5.6  --   --   --   HGB 12.3 11.8* 11.5* 12.4  HCT 35.9* 35.0* 35.0* 36.5  MCV 96.8 97.0 96.4 98.1  PLT 289 296 282 240   Basic Metabolic Panel: Recent Labs  Lab 06/24/20 1340 06/25/20 0811 06/26/20 0606 06/27/20 0325  NA 135 138 138 135  K 4.2 3.6 3.6 4.4  CL 99 98 97* 97*  CO2  26 30 31 29   GLUCOSE 105* 140* 94 99  BUN 26* 16 19 24*  CREATININE 0.98 1.06* 0.96 1.02*  CALCIUM 8.9 8.8* 8.9 9.0  MG  --  1.8  --   --    GFR: Estimated Creatinine Clearance: 42.8 mL/min (A) (by C-G formula based on SCr of 1.02 mg/dL (H)). Liver Function Tests: No results for input(s): AST, ALT, ALKPHOS, BILITOT, PROT, ALBUMIN in the last 168 hours. No results for input(s): LIPASE, AMYLASE in the last 168 hours. No results for input(s): AMMONIA in the last 168 hours. Coagulation Profile: No results for input(s): INR, PROTIME in the last  168 hours. Cardiac Enzymes: No results for input(s): CKTOTAL, CKMB, CKMBINDEX, TROPONINI in the last 168 hours. BNP (last 3 results) No results for input(s): PROBNP in the last 8760 hours. HbA1C: No results for input(s): HGBA1C in the last 72 hours. CBG: No results for input(s): GLUCAP in the last 168 hours. Lipid Profile: No results for input(s): CHOL, HDL, LDLCALC, TRIG, CHOLHDL, LDLDIRECT in the last 72 hours. Thyroid Function Tests: Recent Labs    06/25/20 0811  TSH 8.527*   Anemia Panel: No results for input(s): VITAMINB12, FOLATE, FERRITIN, TIBC, IRON, RETICCTPCT in the last 72 hours. Sepsis Labs: No results for input(s): PROCALCITON, LATICACIDVEN in the last 168 hours.  Recent Results (from the past 240 hour(s))  Respiratory Panel by RT PCR (Flu A&B, Covid) - Nasopharyngeal Swab     Status: None   Collection Time: 06/24/20  1:40 PM   Specimen: Nasopharyngeal Swab  Result Value Ref Range Status   SARS Coronavirus 2 by RT PCR NEGATIVE NEGATIVE Final    Comment: (NOTE) SARS-CoV-2 target nucleic acids are NOT DETECTED.  The SARS-CoV-2 RNA is generally detectable in upper respiratoy specimens during the acute phase of infection. The lowest concentration of SARS-CoV-2 viral copies this assay can detect is 131 copies/mL. A negative result does not preclude SARS-Cov-2 infection and should not be used as the sole basis for treatment or other patient management decisions. A negative result may occur with  improper specimen collection/handling, submission of specimen other than nasopharyngeal swab, presence of viral mutation(s) within the areas targeted by this assay, and inadequate number of viral copies (<131 copies/mL). A negative result must be combined with clinical observations, patient history, and epidemiological information. The expected result is Negative.  Fact Sheet for Patients:  08/24/20  Fact Sheet for Healthcare Providers:   https://www.moore.com/  This test is no t yet approved or cleared by the https://www.young.biz/ FDA and  has been authorized for detection and/or diagnosis of SARS-CoV-2 by FDA under an Emergency Use Authorization (EUA). This EUA will remain  in effect (meaning this test can be used) for the duration of the COVID-19 declaration under Section 564(b)(1) of the Act, 21 U.S.C. section 360bbb-3(b)(1), unless the authorization is terminated or revoked sooner.     Influenza A by PCR NEGATIVE NEGATIVE Final   Influenza B by PCR NEGATIVE NEGATIVE Final    Comment: (NOTE) The Xpert Xpress SARS-CoV-2/FLU/RSV assay is intended as an aid in  the diagnosis of influenza from Nasopharyngeal swab specimens and  should not be used as a sole basis for treatment. Nasal washings and  aspirates are unacceptable for Xpert Xpress SARS-CoV-2/FLU/RSV  testing.  Fact Sheet for Patients: [place]  Fact Sheet for Healthcare Providers: https://www.moore.com/  This test is not yet approved or cleared by the https://www.young.biz/ FDA and  has been authorized for detection and/or diagnosis of SARS-CoV-2 by  FDA under an Emergency Use Authorization (EUA). This EUA will remain  in effect (meaning this test can be used) for the duration of the  Covid-19 declaration under Section 564(b)(1) of the Act, 21  U.S.C. section 360bbb-3(b)(1), unless the authorization is  terminated or revoked. Performed at [facility] [place], [address] [place], [place] [address]          Radiology Studies: No results found.      Scheduled Meds: . apixaban  5 mg Oral BID  . atorvastatin  10 mg Oral Daily  . carvedilol  6.25 mg Oral BID WC  . diltiazem  180 mg Oral Daily  . furosemide  40 mg Intravenous Daily  . irbesartan  75 mg Oral Daily  . levothyroxine  100 mcg Oral Daily  . sodium chloride flush  3 mL Intravenous Q12H   Continuous Infusions: .  sodium chloride    . cefTRIAXone (ROCEPHIN)  IV       LOS: 2 days    Time spent:     Thalia PartyMohammad Z Taysha Majewski, MD [facility] Pager [phone]  If 7PM-7AM, please contact night-coverage [email or web address] Password  06/27/2020, 1:07 PM

## 2020-06-28 DIAGNOSIS — E7800 Pure hypercholesterolemia, unspecified: Secondary | ICD-10-CM

## 2020-06-28 DIAGNOSIS — I4819 Other persistent atrial fibrillation: Secondary | ICD-10-CM | POA: Diagnosis not present

## 2020-06-28 DIAGNOSIS — I5033 Acute on chronic diastolic (congestive) heart failure: Secondary | ICD-10-CM

## 2020-06-28 LAB — CBC
HCT: 36.5 % (ref 36.0–46.0)
Hemoglobin: 12.2 g/dL (ref 12.0–15.0)
MCH: 32.1 pg (ref 26.0–34.0)
MCHC: 33.4 g/dL (ref 30.0–36.0)
MCV: 96.1 fL (ref 80.0–100.0)
Platelets: 294 10*3/uL (ref 150–400)
RBC: 3.8 MIL/uL — ABNORMAL LOW (ref 3.87–5.11)
RDW: 13.5 % (ref 11.5–15.5)
WBC: 8.3 10*3/uL (ref 4.0–10.5)
nRBC: 0 % (ref 0.0–0.2)

## 2020-06-28 LAB — BASIC METABOLIC PANEL
Anion gap: 10 (ref 5–15)
BUN: 25 mg/dL — ABNORMAL HIGH (ref 8–23)
CO2: 30 mmol/L (ref 22–32)
Calcium: 8.8 mg/dL — ABNORMAL LOW (ref 8.9–10.3)
Chloride: 94 mmol/L — ABNORMAL LOW (ref 98–111)
Creatinine, Ser: 0.98 mg/dL (ref 0.44–1.00)
GFR, Estimated: 52 mL/min — ABNORMAL LOW (ref >60)
Glucose, Bld: 99 mg/dL (ref 70–99)
Potassium: 4.1 mmol/L (ref 3.5–5.1)
Sodium: 134 mmol/L — ABNORMAL LOW (ref 135–145)

## 2020-06-28 MED ORDER — DICLOFENAC SODIUM 1 % EX GEL
2.0000 g | Freq: Three times a day (TID) | CUTANEOUS | Status: DC | PRN
  Administered 2020-06-28: 2 g via TOPICAL
  Filled 2020-06-28: qty 100

## 2020-06-28 MED ORDER — DILTIAZEM HCL ER COATED BEADS 120 MG PO CP24
120.0000 mg | ORAL_CAPSULE | Freq: Every day | ORAL | Status: DC
  Administered 2020-06-28 – 2020-07-03 (×6): 120 mg via ORAL
  Filled 2020-06-28 (×6): qty 1

## 2020-06-28 MED ORDER — CARVEDILOL 3.125 MG PO TABS
3.1250 mg | ORAL_TABLET | Freq: Two times a day (BID) | ORAL | Status: DC
  Administered 2020-06-28 – 2020-07-03 (×11): 3.125 mg via ORAL
  Filled 2020-06-28 (×10): qty 1

## 2020-06-28 NOTE — Progress Notes (Addendum)
PROGRESS NOTE    [name]  JSE:[id number] DOB: 10-26-1931 DOA: 06/24/2020 PCP: Everlean Cherry, MD    No chief complaint on file.   Brief Narrative:  Patientis a 86 y.o.femalewith medical history significant forCAD, chronic diastolic CHF (EF 07-37%, G2 DD by TTE 10/31/2019), persistent atrial fibrillation on Eliquis, RBBB, hypertension, hyperlipidemia, andosteoarthritis and chronic back painwho presents to the ED for evaluation of progressive lower extremity edemaand worsening dyspnea with exertion. Chest x-ray showed cardiomegaly without focal consolidation, edema or effusion. Patient also reported a recent cat bite on her right lower extremity. Patient is given IV Lasix and started on oral Augmentin and admitted for further managemenpatient is also complaining of gross hematuria. UA is ordered and will hold Eliquis at this time because of gross hematuria.UA came back positive for UTI.  Today hematuria is resolved and edema of lower extremities is slightly improved but patient is having severe discomfort and tenderness in suprapubic area along with burning micturition therefore plan is to change oral Augmentin to IV ceftriaxone secondary to worsening burning with urination and suprapubic pain.  Subjective:   bp low normal,  2.4liter urine output in the last 24hrs Remain in afib, reports feeling weak, denies chest pain or sob   Assessment & Plan:   Principal Problem:   Acute on chronic diastolic (congestive) heart failure (HCC) Active Problems:   Essential hypertension   Hypercholesterolemia   CAD (coronary artery disease)   Hypothyroidism   Persistent atrial fibrillation (HCC)   Cat bite of right lower leg  Acute on chronic diastolic CHF exacerbation -Responded to IV Lasix, volume status improving -Likely uncontrolled A. fib exacerbating CHF exacerbation, cardiology consulted  A. Fib Continue Coreg Cardizem with reduced doses due to hypotension, continue  Eliquis She was sent from cardiology office for admission on 10/5, per cardiology note plan to diurese then cardioversion , cardiology consulted  Coronary artery disease Stable and denies any chest pain. Continue Coreg and statin   bp low normal Decrease coreg, cardizem, continue low dose irbesartan, continue iv lasix, cardiology consulted  Gross hematuria/UTI -Report history of recurrent UTI -Hemoglobin stable --Urine culture positive gram-negative rods Continue Rocephin, follow-up on culture result  Cat bite to right lower extremity: Reports recurrent cat bite to right lower extremity 2 days prior to admission. Does not appear to be actively infected. Continue prophylactic Augmentin at least 3 days.Wound covered with Mepilex dressing  Hypothyroidism continue Synthroid, TSH 8.5, will discuss with patient about increasing Synthroid Will need repeat TSH in 3 to 4 weeks  Generalized weakness PT OT eval, son request snf placement as patient does not have 24/7 supervision at home  DVT prophylaxis:  apixaban (ELIQUIS) tablet 5 mg   Code Status: Family Communication: son over the phone Disposition:   Status is: Inpatient  Dispo: The patient is from: home              Anticipated d/c is to: SNF              Anticipated d/c date is: Monday or Tuesday after cardioversion with clearance by cardiology              Patient currently not medically stable to discharge  Consultants:   Cardiology  Procedures:   Cardioversion planned  Antimicrobials:   Rocephin     Objective: Vitals:   06/28/20 0002 06/28/20 0341 06/28/20 0500 06/28/20 0807  BP: 105/63 117/63  (!) 92/59  Pulse: 73 82  63  Resp: 17 16  18  Temp: 97.7 F (36.5 C) (!) 97.5 F (36.4 C)  97.6 F (36.4 C)  TempSrc: Oral Oral  Oral  SpO2: 96% 97%  95%  Weight:   73.4 kg   Height:        Intake/Output Summary (Last 24 hours) at 06/28/2020 0834 Last data filed at 06/28/2020 0500 Gross per 24 hour   Intake 580 ml  Output 2400 ml  Net -1820 ml   Filed Weights   06/26/20 0249 06/27/20 0400 06/28/20 0500  Weight: 74.3 kg 75 kg 73.4 kg    Examination:  General exam: Frail, NAD Respiratory system: Clear to auscultation. Respiratory effort normal. Cardiovascular system: IRRR. No JVD, no murmur, No pedal edema. Gastrointestinal system: Abdomen is nondistended, soft and nontender. No organomegaly or masses felt. Normal bowel sounds heard. Central nervous system: Alert and oriented. No focal neurological deficits. Extremities: Equal, generalized weakness, bilateral lower extremity edema has much improved Skin: Protective foam on bilateral anterior shin Psychiatry: Judgement and insight appear normal. Mood & affect appropriate.     Data Reviewed: I have personally reviewed following labs and imaging studies  CBC: Recent Labs  Lab 06/24/20 1340 06/25/20 0811 06/26/20 0606 06/27/20 0325 06/28/20 0357  WBC 7.4 8.3 8.1 8.4 8.3  NEUTROABS 5.6  --   --   --   --   HGB 12.3 11.8* 11.5* 12.4 12.2  HCT 35.9* 35.0* 35.0* 36.5 36.5  MCV 96.8 97.0 96.4 98.1 96.1  PLT 289 296 282 240 294    Basic Metabolic Panel: Recent Labs  Lab 06/24/20 1340 06/25/20 0811 06/26/20 0606 06/27/20 0325 06/28/20 0357  NA 135 138 138 135 134*  K 4.2 3.6 3.6 4.4 4.1  CL 99 98 97* 97* 94*  CO2 26 30 31 29 30   GLUCOSE 105* 140* 94 99 99  BUN 26* 16 19 24* 25*  CREATININE 0.98 1.06* 0.96 1.02* 0.98  CALCIUM 8.9 8.8* 8.9 9.0 8.8*  MG  --  1.8  --   --   --     GFR: Estimated Creatinine Clearance: 44.6 mL/min (by C-G formula based on SCr of 0.98 mg/dL).  Liver Function Tests: No results for input(s): AST, ALT, ALKPHOS, BILITOT, PROT, ALBUMIN in the last 168 hours.  CBG: No results for input(s): GLUCAP in the last 168 hours.   Recent Results (from the past 240 hour(s))  Respiratory Panel by RT PCR (Flu A&B, Covid) - Nasopharyngeal Swab     Status: None   Collection Time: 06/24/20  1:40 PM    Specimen: Nasopharyngeal Swab  Result Value Ref Range Status   SARS Coronavirus 2 by RT PCR NEGATIVE NEGATIVE Final    Comment: (NOTE) SARS-CoV-2 target nucleic acids are NOT DETECTED.  The SARS-CoV-2 RNA is generally detectable in upper respiratoy specimens during the acute phase of infection. The lowest concentration of SARS-CoV-2 viral copies this assay can detect is 131 copies/mL. A negative result does not preclude SARS-Cov-2 infection and should not be used as the sole basis for treatment or other patient management decisions. A negative result may occur with  improper specimen collection/handling, submission of specimen other than nasopharyngeal swab, presence of viral mutation(s) within the areas targeted by this assay, and inadequate number of viral copies (<131 copies/mL). A negative result must be combined with clinical observations, patient history, and epidemiological information. The expected result is Negative.  Fact Sheet for Patients:  08/24/20  Fact Sheet for Healthcare Providers:  https://www.moore.com/  This test is no t  yet approved or cleared by the [place] and  has been authorized for detection and/or diagnosis of SARS-CoV-2 by FDA under an Emergency Use Authorization (EUA). This EUA will remain  in effect (meaning this test can be used) for the duration of the COVID-19 declaration under Section 564(b)(1) of the Act, 21 U.S.C. section 360bbb-3(b)(1), unless the authorization is terminated or revoked sooner.     Influenza A by PCR NEGATIVE NEGATIVE Final   Influenza B by PCR NEGATIVE NEGATIVE Final    Comment: (NOTE) The Xpert Xpress SARS-CoV-2/FLU/RSV assay is intended as an aid in  the diagnosis of influenza from Nasopharyngeal swab specimens and  should not be used as a sole basis for treatment. Nasal washings and  aspirates are unacceptable for Xpert Xpress SARS-CoV-2/FLU/RSV   testing.  Fact Sheet for Patients: https://www.moore.com/  Fact Sheet for Healthcare Providers: https://www.young.biz/  This test is not yet approved or cleared by the [place] FDA and  has been authorized for detection and/or diagnosis of SARS-CoV-2 by  FDA under an Emergency Use Authorization (EUA). This EUA will remain  in effect (meaning this test can be used) for the duration of the  Covid-19 declaration under Section 564(b)(1) of the Act, 21  U.S.C. section 360bbb-3(b)(1), unless the authorization is  terminated or revoked. Performed at [facility], [address] [place], [place] [address]          Radiology Studies: No results found.      Scheduled Meds: . apixaban  5 mg Oral BID  . atorvastatin  10 mg Oral Daily  . carvedilol  3.125 mg Oral BID WC  . diltiazem  120 mg Oral Daily  . furosemide  40 mg Intravenous Daily  . irbesartan  75 mg Oral Daily  . levothyroxine  100 mcg Oral Daily  . sodium chloride flush  3 mL Intravenous Q12H   Continuous Infusions: . sodium chloride    . cefTRIAXone (ROCEPHIN)  IV 1 g (06/27/20 1548)     LOS: 3 days   Time spent: 35 mins Greater than 50% of this time was spent in counseling, explanation of diagnosis, planning of further management, and coordination of care.  I have personally reviewed and interpreted on  06/28/2020 daily labs, tele strips, imagings as discussed above under date review session and assessment and plans.  I reviewed all nursing notes, pharmacy notes, consultant notes,  vitals, pertinent old records  I have discussed plan of care as described above with RN , patient and family on 06/28/2020  Voice Recognition /Dragon dictation system was used to create this note, attempts have been made to correct errors. Please contact the author with questions and/or clarifications.   Albertine Grates, MD PhD FACP [facility]  Available via Epic secure  chat 7am-7pm for nonurgent issues Please page for urgent issues To page the attending provider between 7A-7P or the covering provider during after hours 7P-7A, please log into the web site [email or web address] and access using universal [place] password for that web site. If you do not have the password, please call the hospital operator.    06/28/2020, 8:34 AM

## 2020-06-28 NOTE — Consult Note (Addendum)
Cardiology Consultation:   Patient ID: [name] MRN: [id number]; DOB: [birth year]  Admit date: 06/24/2020 Date of Consult: 06/28/2020  Primary Care Provider: Everlean Cherry, MD [facility] Cardiologist: Thomasene Ripple, DO  [facility] Electrophysiologist:  None    Patient Profile:   [name] is a 86 y.o. female with a hx of CAD, chronic diastolic heart failure, HTN, HLD, hypothyroidism and history of MR who is being seen today for the evaluation of atrial fibrillation at the request of Dr. Roda Shutters.  History of Present Illness:   [name] is a pleasant 86 year old female with past medical history of CAD, chronic diastolic heart failure, HTN, HLD, hypothyroidism and history of MR.  Patient was previously followed by Dr. Tomie China and recently established with Dr. Servando Salina.  Myoview obtained on 10/17/2019 showed EF 64%, small reversible defect of moderate severity present in the basal anterior and basal anteroseptal location, findings consistent with ischemia, overall high risk study.  She subsequently underwent coronary CT on 12/25/2019 which revealed 25 to 49% proximal RCA lesion, patent left main, 25 to 49% proximal LAD lesion, 50 to 69% mid LAD lesion, 25 to 49% mid left circumflex lesion.  Subsequent FFR study showed no hemodynamically significant flow-limiting lesions.  More recently, patient was seen at [facility] in the later half of September 2021 due to cat bite.  During the hospitalization, she was noted to be in atrial fibrillation with RVR.  She was started on Cardizem and Eliquis.  Patient was seen by Dr. Carolynne Edouard on 06/24/2020 who also noted she was having at least 2+ lower extremity pitting edema and was in acute heart failure.  She recommended hospital admission with possible cardioversion as well.  Patient was admitted by hospitalist service on the same day.  Since then, she has underwent aggressive IV diuresis.  During the hospitalization, she did have hematuria requiring the  discontinuation of Eliquis for about 2 days, Eliquis has since been restarted.  She did have a hypotensive episode yesterday as well.  Blood pressure has normalized.  On exam, patient appears to be euvolemic.  She denies any significant chest pain or shortness of breath at this point.  Prior to the hospitalization, she did admit to have some PND.  This has resolved.  Echocardiogram obtained on 06/25/2020 showed EF 55%, inferobasal hypokinesis, normal pulmonary artery systolic pressure, moderate LAE, mild MR.   Past Medical History:  Diagnosis Date  . Abnormal perfusion scintigraphy 07/22/2014   Lexiscan MPS Jan 2012 with mild mis and apical anterior ischemia EF 59% she was seen at [facility] Jan 2012 with mild mis and apical anterior ischemia EF 59% she was seen at [facility] of this note might be different from the original. Overview:  Lexiscan MPS Jan 2012 with mild mis and apical anterior ischemia EF 59% she was seen at [facility] of this note might be different from  . Anxiety disorder 07/22/2014  . B12 deficiency 07/22/2014  . Benign hypertensive heart and kidney disease with chronic kidney disease 07/22/2014  . CAD (coronary artery disease) 01/02/2020  . Chronic kidney disease, stage 2 (mild) 07/22/2014  . Degeneration, intervertebral disc, cervical 07/22/2014  . Depression, major, single episode, moderate (HCC) 07/22/2014  . Essential hypertension 09/10/2019  . Former smoker 10/20/2017  . Hypercholesterolemia 07/22/2014  . HYPOTHYROIDISM, POST-RADIATION 06/11/2009   Qualifier: Diagnosis of  By: Everardo All MD, Cleophas Dunker   . Idiopathic chronic gout of multiple sites without tophus 12/11/2015  . Idiopathic peripheral  neuropathy 12/11/2015  . Low back pain 07/22/2014  . Mitral regurgitation 09/10/2019  . Onychomycosis due to dermatophyte 07/22/2014  . Osteoarthritis, generalized 07/22/2014  . Osteoporosis 08/24/2018   DEXA 08/23/2018  Formatting of this note might be different from the  original. DEXA 08/23/2018  . Pulmonary hypertension (HCC) 12/08/2017  . PVC (premature ventricular contraction) 04/17/2018  . RBBB 10/20/2017  . Recurrent UTI 12/11/2015  . Sciatica 07/22/2014  . Spinal stenosis of lumbar region 12/11/2015  . TMJ arthralgia 07/22/2014  . Unspecified abnormalities of gait and mobility 07/22/2014  . Urge incontinence of urine 07/22/2014   Wears 8 extra absorbent pads during day an on overnight pad at night  Formatting of this note might be different from the original. Wears 8 extra absorbent pads during day an on overnight pad at night  . Varicose veins 07/22/2014  . Venous stasis dermatitis of both lower extremities 07/22/2014  . Vitamin D deficiency 07/22/2014    Past Surgical History:  Procedure Laterality Date  . BACK SURGERY    . JOINT REPLACEMENT     hip and knee     Home Medications:  Prior to Admission medications   Medication Sig Start Date End Date Taking? Authorizing Provider  acetaminophen (TYLENOL) 500 MG tablet Take 1,000 mg by mouth every 6 (six) hours as needed for mild pain.   Yes [provider]  allopurinol (ZYLOPRIM) 100 MG tablet Take 100 mg by mouth daily.   Yes [provider]  atorvastatin (LIPITOR) 10 MG tablet Take 1 tablet (10 mg total) by mouth daily. 06/18/20 09/16/20 Yes Tobb, Kardie, DO  carvedilol (COREG) 6.25 MG tablet Take 1 tablet (6.25 mg total) by mouth 2 (two) times daily with a meal. 01/02/20  Yes Revankar, Aundra Dubin, MD  cholecalciferol (VITAMIN D) 1000 UNITS tablet Take 1,000 Units by mouth daily.   Yes [provider]  colchicine 0.6 MG tablet Take 0.6 mg by mouth daily.   Yes [provider]  diltiazem (CARDIZEM CD) 180 MG 24 hr capsule Take 1 capsule (180 mg total) by mouth daily. 06/18/20  Yes Tobb, Kardie, DO  ELIQUIS 5 MG TABS tablet Take 5 mg by mouth 2 (two) times daily. 06/13/20  Yes [provider]  furosemide (LASIX) 20 MG tablet Take 1 tablet (20 mg total) by mouth daily.  06/18/20  Yes Tobb, Kardie, DO  levothyroxine (SYNTHROID, LEVOTHROID) 100 MCG tablet Take 100 mcg by mouth daily.   Yes [provider]  telmisartan (MICARDIS) 20 MG tablet Take 20 mg by mouth daily.  06/14/20  Yes [provider]  vitamin B-12 (CYANOCOBALAMIN) 1000 MCG tablet Take 1,000 mcg by mouth daily.   Yes [provider]    Inpatient Medications: Scheduled Meds: . apixaban  5 mg Oral BID  . atorvastatin  10 mg Oral Daily  . carvedilol  3.125 mg Oral BID WC  . diltiazem  120 mg Oral Daily  . furosemide  40 mg Intravenous Daily  . irbesartan  75 mg Oral Daily  . levothyroxine  100 mcg Oral Daily  . sodium chloride flush  3 mL Intravenous Q12H   Continuous Infusions: . sodium chloride    . cefTRIAXone (ROCEPHIN)  IV 1 g (06/27/20 1548)   PRN Meds: sodium chloride, acetaminophen, diclofenac Sodium, diphenhydrAMINE, hydrocortisone, ondansetron (ZOFRAN) IV, sodium chloride flush  Allergies:    Allergies  Allergen Reactions  . Isosorbide Nitrate Other (See Comments)    headache  . Cephalexin Rash  . Gentamicin  Dermatitis  . Penicillins Rash  . Sulfa Antibiotics Palpitations    Social History:   Social History   Socioeconomic History  . Marital status: Widowed    Spouse name: Not on file  . Number of children: Not on file  . Years of education: Not on file  . Highest education level: Not on file  Occupational History  . Not on file  Tobacco Use  . Smoking status: Never Smoker  . Smokeless tobacco: Never Used  Substance and Sexual Activity  . Alcohol use: No  . Drug use: No  . Sexual activity: Not on file  Other Topics Concern  . Not on file  Social History Narrative  . Not on file   Social Determinants of Health   Financial Resource Strain:   . Difficulty of Paying Living Expenses: Not on file  Food Insecurity:   . Worried About Programme researcher, broadcasting/film/video in the Last Year: Not on file  . Ran Out of Food in the Last Year: Not on file   Transportation Needs:   . Lack of Transportation (Medical): Not on file  . Lack of Transportation (Non-Medical): Not on file  Physical Activity:   . Days of Exercise per Week: Not on file  . Minutes of Exercise per Session: Not on file  Stress:   . Feeling of Stress : Not on file  Social Connections:   . Frequency of Communication with Friends and Family: Not on file  . Frequency of Social Gatherings with Friends and Family: Not on file  . Attends Religious Services: Not on file  . Active Member of Clubs or Organizations: Not on file  . Attends Banker Meetings: Not on file  . Marital Status: Not on file  Intimate Partner Violence:   . Fear of Current or Ex-Partner: Not on file  . Emotionally Abused: Not on file  . Physically Abused: Not on file  . Sexually Abused: Not on file    Family History:    Family History  Problem Relation Age of Onset  . Cancer Mother   . Hypertension Mother   . Prostate cancer Father   . Heart attack Brother   . Heart disease Brother   . Emphysema Brother      ROS:  Please see the history of present illness.   All other ROS reviewed and negative.     Physical Exam/Data:   Vitals:   06/28/20 0002 06/28/20 0341 06/28/20 0500 06/28/20 0807  BP: 105/63 117/63  (!) 92/59  Pulse: 73 82  63  Resp: 17 16  18   Temp: 97.7 F (36.5 C) (!) 97.5 F (36.4 C)  97.6 F (36.4 C)  TempSrc: Oral Oral  Oral  SpO2: 96% 97%  95%  Weight:   73.4 kg   Height:        Intake/Output Summary (Last 24 hours) at 06/28/2020 1022 Last data filed at 06/28/2020 0947 Gross per 24 hour  Intake 583 ml  Output 2400 ml  Net -1817 ml   Last 3 Weights 06/28/2020 06/27/2020 06/26/2020  Weight (lbs) 161 lb 14.4 oz 165 lb 5.5 oz 163 lb 11.2 oz  Weight (kg) 73.437 kg 75 kg 74.254 kg     Body mass index is 23.23 kg/m.  General:  Well nourished, well developed, in no acute distress HEENT: normal Lymph: no adenopathy Neck: no JVD Endocrine:  No  thryomegaly Vascular: No carotid bruits; FA pulses 2+ bilaterally without bruits  Cardiac:  normal S1,  S2; irregular; no murmur  Lungs:  Mild crackles in bilateral bases of lung Abd: soft, nontender, no hepatomegaly  Ext: no edema Musculoskeletal:  No deformities, BUE and BLE strength normal and equal Skin: warm and dry  Neuro:  CNs 2-12 intact, no focal abnormalities noted Psych:  Normal affect   EKG:  The EKG was personally reviewed and demonstrates:  Atrial fibrillation Telemetry:  Telemetry was personally reviewed and demonstrates:  Rate controlled afib  Relevant CV Studies:  Echo 06/25/2020 1. Inferior basal hypokinesis. Left ventricular ejection fraction, by  estimation, is 55%. The left ventricle has normal function. The left  ventricle has no regional wall motion abnormalities. Left ventricular  diastolic parameters are indeterminate.  2. Right ventricular systolic function is normal. The right ventricular  size is normal. There is normal pulmonary artery systolic pressure.  3. Left atrial size was moderately dilated.  4. Right atrial size was mildly dilated.  5. The mitral valve is normal in structure. Mild mitral valve  regurgitation. No evidence of mitral stenosis.  6. The aortic valve is normal in structure. Aortic valve regurgitation is  not visualized. No aortic stenosis is present.  7. The inferior vena cava is dilated in size with >50% respiratory  variability, suggesting right atrial pressure of 8 mmHg.   Laboratory Data:  High Sensitivity Troponin:   Recent Labs  Lab 06/24/20 1340 06/24/20 1630  TROPONINIHS 9 9     Chemistry Recent Labs  Lab 06/26/20 0606 06/27/20 0325 06/28/20 0357  NA 138 135 134*  K 3.6 4.4 4.1  CL 97* 97* 94*  CO2 31 29 30   GLUCOSE 94 99 99  BUN 19 24* 25*  CREATININE 0.96 1.02* 0.98  CALCIUM 8.9 9.0 8.8*  GFRNONAA 54* 50* 52*  ANIONGAP 10 9 10     No results for input(s): PROT, ALBUMIN, AST, ALT, ALKPHOS, BILITOT  in the last 168 hours. Hematology Recent Labs  Lab 06/26/20 0606 06/27/20 0325 06/28/20 0357  WBC 8.1 8.4 8.3  RBC 3.63* 3.72* 3.80*  HGB 11.5* 12.4 12.2  HCT 35.0* 36.5 36.5  MCV 96.4 98.1 96.1  MCH 31.7 33.3 32.1  MCHC 32.9 34.0 33.4  RDW 13.8 13.6 13.5  PLT 282 240 294   BNP Recent Labs  Lab 06/24/20 1340  BNP 346.3*    DDimer No results for input(s): DDIMER in the last 168 hours.   Radiology/Studies:  DG Chest 2 View  Result Date: 06/24/2020 CLINICAL DATA:  Leg swelling EXAM: CHEST - 2 VIEW COMPARISON:  June 11, 2020 FINDINGS: The cardiomediastinal silhouette is unchanged and enlarged in contour. No pleural effusion. No pneumothorax. Unchanged linear opacity in the RIGHT lung base, likely atelectasis/scar. Visualized abdomen is unremarkable. Multilevel degenerative changes of the thoracic spine. IMPRESSION: No acute cardiopulmonary abnormality. Electronically Signed   By: 08/24/2020 MD   On: 06/24/2020 13:17   ECHOCARDIOGRAM COMPLETE  Result Date: 06/25/2020    ECHOCARDIOGRAM REPORT   Patient Name:   [name] [facility] Date of Exam: 06/25/2020 Medical Rec #:  [facility]      Height:       70.0 in Accession #:    08/25/2020     Weight:       165.9 lb Date of Birth:  [birth year]       BSA:          1.928 m Patient Age:    88 years       BP:  104/57 mmHg Patient Gender: F              HR:           78 bpm. Exam Location:  Inpatient Procedure: 2D Echo, Cardiac Doppler and Color Doppler Indications:    I50.33 Acute on chronic diastolic (congestive) heart failure  History:        Patient has prior history of Echocardiogram examinations, most                 recent 10/31/2019. CAD, Pulmonary HTN, Arrythmias:RBBB; Risk                 Factors:Hypertension, Dyslipidemia and Former Smoker.                 Hypothyroidism.  Sonographer:    Elmarie Shileyiffany Dance Referring Phys: [id number] VISHAL R PATEL IMPRESSIONS  1. Inferior basal hypokinesis. Left ventricular ejection fraction, by  estimation, is 55%. The left ventricle has normal function. The left ventricle has no regional wall motion abnormalities. Left ventricular diastolic parameters are indeterminate.  2. Right ventricular systolic function is normal. The right ventricular size is normal. There is normal pulmonary artery systolic pressure.  3. Left atrial size was moderately dilated.  4. Right atrial size was mildly dilated.  5. The mitral valve is normal in structure. Mild mitral valve regurgitation. No evidence of mitral stenosis.  6. The aortic valve is normal in structure. Aortic valve regurgitation is not visualized. No aortic stenosis is present.  7. The inferior vena cava is dilated in size with >50% respiratory variability, suggesting right atrial pressure of 8 mmHg. FINDINGS  Left Ventricle: Inferior basal hypokinesis. Left ventricular ejection fraction, by estimation, is 55%. The left ventricle has normal function. The left ventricle has no regional wall motion abnormalities. The left ventricular internal cavity size was normal in size. There is no left ventricular hypertrophy. Left ventricular diastolic parameters are indeterminate. Right Ventricle: The right ventricular size is normal. No increase in right ventricular wall thickness. Right ventricular systolic function is normal. There is normal pulmonary artery systolic pressure. The tricuspid regurgitant velocity is 2.58 m/s, and  with an assumed right atrial pressure of 8 mmHg, the estimated right ventricular systolic pressure is 34.6 mmHg. Left Atrium: Left atrial size was moderately dilated. Right Atrium: Right atrial size was mildly dilated. Pericardium: There is no evidence of pericardial effusion. Mitral Valve: The mitral valve is normal in structure. Mild mitral valve regurgitation. No evidence of mitral valve stenosis. Tricuspid Valve: The tricuspid valve is normal in structure. Tricuspid valve regurgitation is mild . No evidence of tricuspid stenosis. Aortic Valve:  The aortic valve is normal in structure. Aortic valve regurgitation is not visualized. No aortic stenosis is present. Pulmonic Valve: The pulmonic valve was normal in structure. Pulmonic valve regurgitation is not visualized. No evidence of pulmonic stenosis. Aorta: The aortic root is normal in size and structure. Venous: The inferior vena cava is dilated in size with greater than 50% respiratory variability, suggesting right atrial pressure of 8 mmHg. IAS/Shunts: No atrial level shunt detected by color flow Doppler.  LEFT VENTRICLE PLAX 2D LVIDd:         4.30 cm LVIDs:         3.61 cm LV PW:         1.13 cm LV IVS:        0.79 cm LVOT diam:     1.80 cm LV SV:         45  LV SV Index:   23 LVOT Area:     2.54 cm  RIGHT VENTRICLE          IVC RV Basal diam:  2.77 cm  IVC diam: 2.57 cm TAPSE (M-mode): 1.6 cm LEFT ATRIUM             Index       RIGHT ATRIUM           Index LA diam:        4.40 cm 2.28 cm/m  RA Area:     21.40 cm LA Vol (A2C):   74.5 ml 38.64 ml/m RA Volume:   72.00 ml  37.35 ml/m LA Vol (A4C):   59.2 ml 30.71 ml/m LA Biplane Vol: 67.3 ml 34.91 ml/m  AORTIC VALVE LVOT Vmax:   96.35 cm/s LVOT Vmean:  62.650 cm/s LVOT VTI:    0.176 m  AORTA Ao Root diam: 2.90 cm Ao Asc diam:  2.90 cm MITRAL VALVE                TRICUSPID VALVE MV Area (PHT): 3.05 cm     TR Peak grad:   26.6 mmHg MV Decel Time: 249 msec     TR Vmax:        258.00 cm/s MV E velocity: 150.00 cm/s                             SHUNTS                             Systemic VTI:  0.18 m                             Systemic Diam: 1.80 cm Charlton Haws MD Electronically signed by Charlton Haws MD Signature Date/Time: 06/25/2020/9:52:34 AM    Final      Assessment and Plan:   1. Persistent atrial fibrillation: Rate controlled on diltiazem.  Eliquis was held for roughly 2 days during this hospitalization due to hematuria, this has been restarted.  Will discuss with MD regarding TEE DCCV on Monday or Tuesday.  Risk and benefit has been  discussed with the patient who is agreeable to proceed.  2. Chronic diastolic heart failure: Likely exacerbated by the recent recurrence of atrial fibrillation.  She underwent aggressive IV diuresis.  She is near euvolemic level at this point.  Intake/output -3.8 L. Weight down from 167.6 lbs to 161.9 lbs. Consider transition to 40 mg daily of oral Lasix.  Once she is cardioverted, this dosage may need to be further decreased as outpatient based on blood work.  3. Hematuria: Resolved  4. UTI: Per hospitalist service  5. CAD: Seen on previous coronary CT in April 2021, FFR showed no significant obstructive lesion.  Continue statin.  No aspirin given the need for Eliquis. Denies any significant chest pan. Echocardiogram obtained during this admission showed inferior basal hypokinesis, however given lack of anginal symptom and reassuring coronary CT with FFR in Apr 2021, will hold off further workup.   6. Hypertension: Patient had a hypotensive episode yesterday, blood pressure normalized today.  Continue carvedilol and the diltiazem  7. Hyperlipidemia: On Lipitor  8. Hypothyroidism: On levothyroxine        New York Heart Association (NYHA) Functional Class NYHA Class II   CHA2DS2-VASc Score = 6  This indicates a  9.7% annual risk of stroke. The patient's score is based upon: CHF History: 1 HTN History: 1 Diabetes History: 0 Stroke History: 0 Vascular Disease History: 1 Age Score: 2 Gender Score: 1         For questions or updates, please contact [facility] Please consult [email or web address] for contact info under    Ramond Dial, [place]  06/28/2020 10:22 AM    Attending Note:   The patient was seen and examined.  Agree with assessment and plan as noted above.  Changes made to the above note as needed.  Patient seen and independently examined with Azalee Course, PA .   We discussed all aspects of the encounter. I agree with the assessment and plan as stated above.  1.  Afib:    Newly diagnosed.   Is on eliquis.  HR is well controlled .   For TEE / CV on Monday / Tuesday   2.  Acute on chronic diastolic CHF:  Better after IV lasix.   Likely exacerbated by her afib .  Cont rate control.  3. HLD :  Cont crestor     I have spent a total of 40 minutes with patient reviewing hospital  notes , telemetry, EKGs, labs and examining patient as well as establishing an assessment and plan that was discussed with the patient. > 50% of time was spent in direct patient care.    Vesta Mixer, Montez Hageman., MD, [facility] 06/28/2020, 11:29 AM [address]  [address] Office [phone] Pager [phone]

## 2020-06-29 DIAGNOSIS — I5033 Acute on chronic diastolic (congestive) heart failure: Secondary | ICD-10-CM | POA: Diagnosis not present

## 2020-06-29 LAB — BASIC METABOLIC PANEL
Anion gap: 9 (ref 5–15)
BUN: 33 mg/dL — ABNORMAL HIGH (ref 8–23)
CO2: 32 mmol/L (ref 22–32)
Calcium: 9 mg/dL (ref 8.9–10.3)
Chloride: 96 mmol/L — ABNORMAL LOW (ref 98–111)
Creatinine, Ser: 1.17 mg/dL — ABNORMAL HIGH (ref 0.44–1.00)
GFR, Estimated: 42 mL/min — ABNORMAL LOW (ref >60)
Glucose, Bld: 110 mg/dL — ABNORMAL HIGH (ref 70–99)
Potassium: 4.8 mmol/L (ref 3.5–5.1)
Sodium: 137 mmol/L (ref 135–145)

## 2020-06-29 LAB — URINE CULTURE: Culture: 80000 — AB

## 2020-06-29 LAB — MAGNESIUM: Magnesium: 1.9 mg/dL (ref 1.7–2.4)

## 2020-06-29 MED ORDER — NITROFURANTOIN MONOHYD MACRO 100 MG PO CAPS
100.0000 mg | ORAL_CAPSULE | Freq: Two times a day (BID) | ORAL | Status: AC
  Administered 2020-06-29 – 2020-07-01 (×6): 100 mg via ORAL
  Filled 2020-06-29 (×6): qty 1

## 2020-06-29 MED ORDER — LEVOTHYROXINE SODIUM 25 MCG PO TABS
125.0000 ug | ORAL_TABLET | Freq: Every day | ORAL | Status: DC
  Administered 2020-06-30 – 2020-07-03 (×3): 125 ug via ORAL
  Filled 2020-06-29 (×3): qty 1

## 2020-06-29 NOTE — Progress Notes (Signed)
PROGRESS NOTE    [name]  TSV:[id number] DOB: [birth year] DOA: 06/24/2020 PCP: Everlean Cherry, MD    No chief complaint on file.   Brief Narrative:  Patientis a 86 y.o.femalewith medical history significant forCAD, chronic diastolic CHF (EF 92-33%, G2 DD by TTE 10/31/2019), persistent atrial fibrillation on Eliquis, RBBB, hypertension, hyperlipidemia, andosteoarthritis and chronic back painwho presents to the ED for evaluation of progressive lower extremity edemaand worsening dyspnea with exertion. Chest x-ray showed cardiomegaly without focal consolidation, edema or effusion. Patient also reported a recent cat bite on her right lower extremity. Patient is given IV Lasix and started on oral Augmentin and admitted for further managemenpatient is also complaining of gross hematuria. UA is ordered and will hold Eliquis at this time because of gross hematuria.UA came back positive for UTI.  Today hematuria is resolved and edema of lower extremities is slightly improved but patient is having severe discomfort and tenderness in suprapubic area along with burning micturition therefore plan is to change oral Augmentin to IV ceftriaxone secondary to worsening burning with urination and suprapubic pain.  Subjective:   bp low normal,  850cc urine output in the last 24hrs documented, not sure is accurate Remain in afib, reports feeling weak, denies chest pain or sob currently   Assessment & Plan:   Principal Problem:   Acute on chronic diastolic (congestive) heart failure (HCC) Active Problems:   Essential hypertension   Hypercholesterolemia   CAD (coronary artery disease)   Hypothyroidism   Persistent atrial fibrillation (HCC)   Cat bite of right lower leg  Acute on chronic diastolic CHF exacerbation -Responded to IV Lasix, volume status improving -Likely uncontrolled A. fib exacerbating CHF exacerbation, cardiology consulted, lasix management  per cardiology   A.  Fib Continue Coreg Cardizem with reduced doses due to hypotension, continue Eliquis She was sent from cardiology office for admission on 10/5, per cardiology note plan to diurese then cardioversion , cardiology consulted, plan for cardioversion on 10/12  Coronary artery disease Stable and denies any chest pain. Continue Coreg and statin   bp low normal On decreased dose  Coreg and cardizem, continue low dose irbesartan, continue iv lasix, cardiology consulted  Gross hematuria/UTI -Report history of recurrent UTI -Hemoglobin stable --Urine culture positive 80000+ citrobacter , due to symptomatic will treat with three days nitrofurantoin   Cat bite to right lower extremity: Reports recurrent cat bite to right lower extremity 2 days prior to admission. Does not appear to be actively infected. Continue prophylactic Augmentin at least 3 days.Wound covered with Mepilex dressing Reports rash while on augmentin appear resolved.  Hypothyroidism continue Synthroid, TSH 8.5,  increase Synthroid from daily to 125 mcg daily Will need repeat TSH in 3 to 4 weeks  Generalized weakness PT OT eval, son request snf placement as patient does not have 24/7 supervision at home  DVT prophylaxis:  apixaban (ELIQUIS) tablet 5 mg   Code Status: Family Communication: son over the phone Disposition:   Status is: Inpatient  Dispo: The patient is from: home              Anticipated d/c is to: SNF              Anticipated d/c date is: Tuesday after cardioversion with clearance by cardiology              Patient currently not medically stable to discharge  Consultants:   Cardiology  Procedures:   Cardioversion planned on 10/12  Antimicrobials:  Rocephin then notrofurantoin     Objective: Vitals:   06/29/20 0125 06/29/20 0333 06/29/20 0700 06/29/20 0800  BP:  128/73  (!) 102/51  Pulse:   71 76  Resp:  15 (!) 23 13  Temp:  97.8 F (36.6 C)    TempSrc:      SpO2:  95% 96% 95%   Weight: 73.8 kg     Height:        Intake/Output Summary (Last 24 hours) at 06/29/2020 1311 Last data filed at 06/29/2020 1247 Gross per 24 hour  Intake 460 ml  Output 1550 ml  Net -1090 ml   Filed Weights   06/27/20 0400 06/28/20 0500 06/29/20 0125  Weight: 75 kg 73.4 kg 73.8 kg    Examination:  General exam: Frail, NAD Respiratory system: Clear to auscultation. Respiratory effort normal. Cardiovascular system: IRRR. No JVD, no murmur, No pedal edema. Gastrointestinal system: Abdomen is nondistended, soft and nontender. No organomegaly or masses felt. Normal bowel sounds heard. Central nervous system: Alert and oriented. No focal neurological deficits. Extremities: Equal, generalized weakness, bilateral lower extremity edema has much improved Skin: Protective foam on bilateral anterior shin Psychiatry: Judgement and insight appear normal. Mood & affect appropriate.     Data Reviewed: I have personally reviewed following labs and imaging studies  CBC: Recent Labs  Lab 06/24/20 1340 06/25/20 0811 06/26/20 0606 06/27/20 0325 06/28/20 0357  WBC 7.4 8.3 8.1 8.4 8.3  NEUTROABS 5.6  --   --   --   --   HGB 12.3 11.8* 11.5* 12.4 12.2  HCT 35.9* 35.0* 35.0* 36.5 36.5  MCV 96.8 97.0 96.4 98.1 96.1  PLT 289 296 282 240 294    Basic Metabolic Panel: Recent Labs  Lab 06/25/20 0811 06/26/20 0606 06/27/20 0325 06/28/20 0357 06/29/20 0252  NA 138 138 135 134* 137  K 3.6 3.6 4.4 4.1 4.8  CL 98 97* 97* 94* 96*  CO2 30 31 29 30  32  GLUCOSE 140* 94 99 99 110*  BUN 16 19 24* 25* 33*  CREATININE 1.06* 0.96 1.02* 0.98 1.17*  CALCIUM 8.8* 8.9 9.0 8.8* 9.0  MG 1.8  --   --   --  1.9    GFR: Estimated Creatinine Clearance: 37.3 mL/min (A) (by C-G formula based on SCr of 1.17 mg/dL (H)).  Liver Function Tests: No results for input(s): AST, ALT, ALKPHOS, BILITOT, PROT, ALBUMIN in the last 168 hours.  CBG: No results for input(s): GLUCAP in the last 168  hours.   Recent Results (from the past 240 hour(s))  Respiratory Panel by RT PCR (Flu A&B, Covid) - Nasopharyngeal Swab     Status: None   Collection Time: 06/24/20  1:40 PM   Specimen: Nasopharyngeal Swab  Result Value Ref Range Status   SARS Coronavirus 2 by RT PCR NEGATIVE NEGATIVE Final    Comment: (NOTE) SARS-CoV-2 target nucleic acids are NOT DETECTED.  The SARS-CoV-2 RNA is generally detectable in upper respiratoy specimens during the acute phase of infection. The lowest concentration of SARS-CoV-2 viral copies this assay can detect is 131 copies/mL. A negative result does not preclude SARS-Cov-2 infection and should not be used as the sole basis for treatment or other patient management decisions. A negative result may occur with  improper specimen collection/handling, submission of specimen other than nasopharyngeal swab, presence of viral mutation(s) within the areas targeted by this assay, and inadequate number of viral copies (<131 copies/mL). A negative result must be combined with clinical observations,  patient history, and epidemiological information. The expected result is Negative.  Fact Sheet for Patients:  https://www.moore.com/  Fact Sheet for Healthcare Providers:  https://www.young.biz/  This test is no t yet approved or cleared by the [place] FDA and  has been authorized for detection and/or diagnosis of SARS-CoV-2 by FDA under an Emergency Use Authorization (EUA). This EUA will remain  in effect (meaning this test can be used) for the duration of the COVID-19 declaration under Section 564(b)(1) of the Act, 21 U.S.C. section 360bbb-3(b)(1), unless the authorization is terminated or revoked sooner.     Influenza A by PCR NEGATIVE NEGATIVE Final   Influenza B by PCR NEGATIVE NEGATIVE Final    Comment: (NOTE) The Xpert Xpress SARS-CoV-2/FLU/RSV assay is intended as an aid in  the diagnosis of influenza from  Nasopharyngeal swab specimens and  should not be used as a sole basis for treatment. Nasal washings and  aspirates are unacceptable for Xpert Xpress SARS-CoV-2/FLU/RSV  testing.  Fact Sheet for Patients: https://www.moore.com/  Fact Sheet for Healthcare Providers: https://www.young.biz/  This test is not yet approved or cleared by the [place] FDA and  has been authorized for detection and/or diagnosis of SARS-CoV-2 by  FDA under an Emergency Use Authorization (EUA). This EUA will remain  in effect (meaning this test can be used) for the duration of the  Covid-19 declaration under Section 564(b)(1) of the Act, 21  U.S.C. section 360bbb-3(b)(1), unless the authorization is  terminated or revoked. Performed at [facility], [address] [place], [place] [address]   Culture, Urine     Status: Abnormal   Collection Time: 06/27/20 10:10 AM   Specimen: Urine, Clean Catch  Result Value Ref Range Status   Specimen Description URINE, CLEAN CATCH  Final   Special Requests   Final    NONE Performed at [facility], [address] [place], [place] [address]    Culture 80,000 COLONIES/mL CITROBACTER FREUNDII (A)  Final   Report Status 06/29/2020 FINAL  Final   Organism ID, Bacteria CITROBACTER FREUNDII (A)  Final      Susceptibility   Citrobacter freundii - MIC*    CEFAZOLIN >=64 RESISTANT Resistant     CEFTRIAXONE >=64 RESISTANT Resistant     CIPROFLOXACIN <=0.25 SENSITIVE Sensitive     GENTAMICIN <=1 SENSITIVE Sensitive     IMIPENEM 0.5 SENSITIVE Sensitive     NITROFURANTOIN <=16 SENSITIVE Sensitive     TRIMETH/SULFA <=20 SENSITIVE Sensitive     PIP/TAZO >=128 RESISTANT Resistant     * 80,000 COLONIES/mL CITROBACTER FREUNDII         Radiology Studies: No results found.      Scheduled Meds: . apixaban  5 mg Oral BID  . atorvastatin  10 mg Oral Daily  . carvedilol  3.125 mg Oral BID WC  . diltiazem  120 mg  Oral Daily  . furosemide  40 mg Intravenous Daily  . irbesartan  75 mg Oral Daily  . levothyroxine  100 mcg Oral Daily  . nitrofurantoin (macrocrystal-monohydrate)  100 mg Oral Q12H  . sodium chloride flush  3 mL Intravenous Q12H   Continuous Infusions: . sodium chloride       LOS: 4 days   Time spent: 35 mins Greater than 50% of this time was spent in counseling, explanation of diagnosis, planning of further management, and coordination of care.  I have personally reviewed and interpreted on  06/29/2020 daily labs, tele strips, imagings as discussed  above under date review session and assessment and plans.  I reviewed all nursing notes, pharmacy notes, consultant notes,  vitals, pertinent old records  I have discussed plan of care as described above with RN , patient and family on 06/29/2020  Voice Recognition /Dragon dictation system was used to create this note, attempts have been made to correct errors. Please contact the author with questions and/or clarifications.   Albertine Grates, MD PhD FACP [facility]  Available via Epic secure chat 7am-7pm for nonurgent issues Please page for urgent issues To page the attending provider between 7A-7P or the covering provider during after hours 7P-7A, please log into the web site [email or web address] and access using universal  password for that web site. If you do not have the password, please call the hospital operator.    06/29/2020, 1:11 PM

## 2020-06-29 NOTE — Progress Notes (Signed)
Physical Therapy Treatment Patient Details Name: [name] MRN: [id number] DOB: 01-13-1932 Today's Date: 06/29/2020    History of Present Illness [name] is a 86 y.o. female with medical history significant for CAD, chronic diastolic CHF (EF 08-14%, G2 DD by TTE 10/31/2019), persistent atrial fibrillation on Eliquis, RBBB, hypertension, hyperlipidemia, and osteoarthritis and chronic back pain who presents to the ED for evaluation of progressive lower extremity edema.    PT Comments    Session focused on bed level therapeutic exercises for strengthening and functional mobility. Pt requiring min-mod assist for bed mobility, min guard assist for transfers to standing using a walker. Pt with continued hypotension (although seems to be improving in comparison to past sessions) and is symptomatic, requesting to sit back down. Updated d/c plan in light of pt not having 24/7 supervision at home.   Orthostatic Vitals: Supine: 102/51 Sitting: 95/61 Standing: 91/55    Follow Up Recommendations  SNF     Equipment Recommendations  None recommended by PT    Recommendations for Other Services       Precautions / Restrictions Precautions Precautions: Fall Restrictions Weight Bearing Restrictions: No    Mobility  Bed Mobility Overal bed mobility: Needs Assistance Bed Mobility: Supine to Sit;Sit to Supine     Supine to sit: HOB elevated;Min assist Sit to supine: Mod assist;HOB elevated   General bed mobility comments: MinA for trunk to complete upright position, modA for BLE elevation back into bed  Transfers Overall transfer level: Needs assistance Equipment used: Rolling walker (2 wheeled) Transfers: Sit to/from [employer] Sit to Stand: Min guard Stand pivot transfers: Min guard          Ambulation/Gait                 Stairs             Wheelchair Mobility    Modified Rankin (Stroke Patients Only)       Balance Overall  balance assessment: Needs assistance Sitting-balance support: Single extremity supported Sitting balance-Leahy Scale: Good     Standing balance support: Bilateral upper extremity supported;During functional activity Standing balance-Leahy Scale: Poor Standing balance comment: relies on UE support                            Cognition Arousal/Alertness: Awake/alert Behavior During Therapy: WFL for tasks assessed/performed Overall Cognitive Status: Within Functional Limits for tasks assessed                                        Exercises General Exercises - Lower Extremity Ankle Circles/Pumps: Both;15 reps;Supine Heel Slides: Both;10 reps;Supine Straight Leg Raises: Both;10 reps;Supine    General Comments        Pertinent Vitals/Pain Pain Assessment: No/denies pain    Home Living                      Prior Function            PT Goals (current goals can now be found in the care plan section) Acute Rehab PT Goals Patient Stated Goal: Decrease edema to move better Potential to Achieve Goals: Good    Frequency    Min 3X/week      PT Plan Discharge plan needs to be updated    Co-evaluation  AM-PAC PT "6 Clicks" Mobility   Outcome Measure  Help needed turning from your back to your side while in a flat bed without using bedrails?: A Little Help needed moving from lying on your back to sitting on the side of a flat bed without using bedrails?: A Little Help needed moving to and from a bed to a chair (including a wheelchair)?: A Little Help needed standing up from a chair using your arms (e.g., wheelchair or bedside chair)?: A Little Help needed to walk in hospital room?: A Lot Help needed climbing 3-5 steps with a railing? : Total 6 Click Score: 15    End of Session Equipment Utilized During Treatment: Gait belt Activity Tolerance: Patient tolerated treatment well Patient left: in bed;with call  bell/phone within reach;with bed alarm set   PT Visit Diagnosis: Muscle weakness (generalized) (M62.81)     Time: 0037-0488 PT Time Calculation (min) (ACUTE ONLY): 24 min  Charges:  $Therapeutic Exercise: 8-22 mins $Therapeutic Activity: 8-22 mins                     Lillia Pauls, PT, DPT Acute Rehabilitation Services Pager [phone] Office [phone]    Norval Morton 06/29/2020, 3:48 PM

## 2020-06-29 NOTE — Progress Notes (Signed)
Cardiology Admission History and Physical:   Patient ID: [name] MRN: [id number]; DOB: Jul 17, 1932   Admission date: 06/24/2020  Primary Care Provider: Everlean Cherry, MD [facility] Cardiologist: Thomasene Ripple, DO    Patient Profile:   [name] is a 86 y.o. female with Nonobstructive CAD by CCTA, HFpEF and mild MR, PAF (CHASVASC 5 on Eliquis), HTN, HLD, Hypothyroidism with worsened HF in the setting of AF RVR.  Interval events   Patient notes persistent of shortness of breath, through improved.  Feels anxious.  Net negative -1.8 L.  Weight down. Patient un sure of dry weight.  Medications  Current Facility-Administered Medications (Endocrine & Metabolic):  .  levothyroxine (SYNTHROID) tablet 100 mcg   Current Facility-Administered Medications (Cardiovascular):  .  atorvastatin (LIPITOR) tablet 10 mg .  carvedilol (COREG) tablet 3.125 mg .  diltiazem (CARDIZEM CD) 24 hr capsule 120 mg .  furosemide (LASIX) injection 40 mg .  irbesartan (AVAPRO) tablet 75 mg   Current Facility-Administered Medications (Respiratory):  .  diphenhydrAMINE (BENADRYL) capsule 25 mg   Current Facility-Administered Medications (Analgesics):  .  acetaminophen (TYLENOL) tablet 650 mg   Current Facility-Administered Medications (Hematological):  .  apixaban (ELIQUIS) tablet 5 mg   Current Facility-Administered Medications (Other):  .  0.9 %  sodium chloride infusion .  cefTRIAXone (ROCEPHIN) 1 g in sodium chloride 0.9 % 100 mL IVPB .  diclofenac Sodium (VOLTAREN) 1 % topical gel 2 g .  hydrocortisone 1 % ointment .  ondansetron (ZOFRAN) injection 4 mg .  sodium chloride flush (NS) 0.9 % injection 3 mL .  sodium chloride flush (NS) 0.9 % injection 3 mL  No current outpatient medications on file.  Physical Exam/Data:   Vitals:   06/28/20 2004 06/28/20 2035 06/29/20 0125 06/29/20 0333  BP: (!) 93/54 (!) 103/55  128/73  Pulse: 73 78    Resp: (!) 24 12  15   Temp: 97.8 F  (36.6 C) 97.8 F (36.6 C)  97.8 F (36.6 C)  TempSrc: Oral     SpO2: 95% 98%  95%  Weight:   73.8 kg   Height:        Intake/Output Summary (Last 24 hours) at 06/29/2020 0802 Last data filed at 06/29/2020 0334 Gross per 24 hour  Intake 103 ml  Output 1350 ml  Net -1247 ml   Last 3 Weights 06/29/2020 06/28/2020 06/27/2020  Weight (lbs) 162 lb 12.8 oz 161 lb 14.4 oz 165 lb 5.5 oz  Weight (kg) 73.846 kg 73.437 kg 75 kg     Body mass index is 23.36 kg/m.   General:  Well nourished, well developed, in no acute distress HEENT: normal Neck:  JVD to mid neck at 45 degrees Cardiac:  normal S1, S2; irregularly irregular; no murmur  Lungs:  clear to auscultation bilaterally, no wheezing, rhonchi or rales  Abd: soft, nontender, no hepatomegaly  Ext: +1 legs edema Musculoskeletal:  No deformities, BUE and BLE strength normal and equal Skin: warm and dry  Psych:  Normal affect   Tele:  A fib rate controlled, < 110  Relevant CV Studies: 06/25/20 Echo IMPRESSIONS   1. Inferior basal hypokinesis. Left ventricular ejection fraction, by  estimation, is 55%. The left ventricle has normal function. The left  ventricle has no regional wall motion abnormalities. Left ventricular  diastolic parameters are indeterminate.  2. Right ventricular systolic function is normal. The right ventricular  size is normal. There is normal pulmonary artery systolic pressure.  3.  Left atrial size was moderately dilated.  4. Right atrial size was mildly dilated.  5. The mitral valve is normal in structure. Mild mitral valve  regurgitation. No evidence of mitral stenosis.  6. The aortic valve is normal in structure. Aortic valve regurgitation is  not visualized. No aortic stenosis is present.  7. The inferior vena cava is dilated in size with >50% respiratory  variability, suggesting right atrial pressure of 8 mmHg.   Laboratory Data:  High Sensitivity Troponin:   Recent Labs  Lab 06/24/20 1340  06/24/20 1630  TROPONINIHS 9 9      Chemistry Recent Labs  Lab 06/28/20 0357 06/29/20 0252  NA 134* 137  K 4.1 4.8  CL 94* 96*  CO2 30 32  GLUCOSE 99 110*  BUN 25* 33*  CREATININE 0.98 1.17*  CALCIUM 8.8* 9.0  GFRNONAA 52* 42*  ANIONGAP 10 9    Hematology Recent Labs  Lab 06/27/20 0325 06/28/20 0357  WBC 8.4 8.3  RBC 3.72* 3.80*  HGB 12.4 12.2  HCT 36.5 36.5  MCV 98.1 96.1  MCH 33.3 32.1  MCHC 34.0 33.4  RDW 13.6 13.5  PLT 240 294   BNP Recent Labs  Lab 06/24/20 1340  BNP 346.3*     Assessment and Plan:    Persistent atrial fibrillation - Rate controlled on diltiazem and coreg - Eliquis was held for roughly 2 days during this hospitalization due to hematuria - Planning for TEE DCCV 07/01/20 when euvolemic.-   Risk and benefit has been discussed with the patient who is agreeable to proceed.  HFpEF:  - Likely exacerbated by the recent recurrence of atrial fibrillation.   - blood pressure well controled - will continue lasix 40 IV presently  Nonobstructive CAD and HLD - on eliquis instead of ASA, statin, ARB, and BB - Seen on previous coronary CT in April 2021, FFR showed no significant obstructive lesion.    Hypertension without further hypotenion: - plan as above, one episodes 06/28/20  Hematuria: Resolved UTI: Per hospitalist service Hypothyroidism: On levothyroxine  For questions or updates, please contact [facility] Please consult [email or web address] for contact info under     Signed, Christell Constant, MD  06/29/2020 8:02 AM

## 2020-06-30 DIAGNOSIS — I4819 Other persistent atrial fibrillation: Secondary | ICD-10-CM | POA: Diagnosis not present

## 2020-06-30 DIAGNOSIS — I5033 Acute on chronic diastolic (congestive) heart failure: Secondary | ICD-10-CM | POA: Diagnosis not present

## 2020-06-30 LAB — CBC
HCT: 39.4 % (ref 36.0–46.0)
Hemoglobin: 13 g/dL (ref 12.0–15.0)
MCH: 32 pg (ref 26.0–34.0)
MCHC: 33 g/dL (ref 30.0–36.0)
MCV: 97 fL (ref 80.0–100.0)
Platelets: 288 10*3/uL (ref 150–400)
RBC: 4.06 MIL/uL (ref 3.87–5.11)
RDW: 13.5 % (ref 11.5–15.5)
WBC: 7.4 10*3/uL (ref 4.0–10.5)
nRBC: 0 % (ref 0.0–0.2)

## 2020-06-30 LAB — BASIC METABOLIC PANEL
Anion gap: 8 (ref 5–15)
BUN: 27 mg/dL — ABNORMAL HIGH (ref 8–23)
CO2: 32 mmol/L (ref 22–32)
Calcium: 9.1 mg/dL (ref 8.9–10.3)
Chloride: 97 mmol/L — ABNORMAL LOW (ref 98–111)
Creatinine, Ser: 1.02 mg/dL — ABNORMAL HIGH (ref 0.44–1.00)
GFR, Estimated: 50 mL/min — ABNORMAL LOW (ref >60)
Glucose, Bld: 99 mg/dL (ref 70–99)
Potassium: 4.6 mmol/L (ref 3.5–5.1)
Sodium: 137 mmol/L (ref 135–145)

## 2020-06-30 LAB — MAGNESIUM: Magnesium: 2.1 mg/dL (ref 1.7–2.4)

## 2020-06-30 NOTE — Plan of Care (Signed)
  Problem: Education: Goal: Knowledge of General Education information will improve Description: Including pain rating scale, medication(s)/side effects and non-pharmacologic comfort measures Outcome: Progressing   Problem: Nutrition: Goal: Adequate nutrition will be maintained Outcome: Progressing   Problem: Safety: Goal: Ability to remain free from injury will improve Outcome: Progressing   

## 2020-06-30 NOTE — Progress Notes (Signed)
° ° °  Subjective:  Smothering feeling from last night gone Fan at bedside helps Discussed TEE/DCC for 10/12  Objective:  Vitals:   06/29/20 0700 06/29/20 0800 06/29/20 1641 06/30/20 0553  BP:  (!) 102/51 131/72 (!) 112/47  Pulse: 71 76 77 (!) 44  Resp: (!) 23 13 13 17   Temp:    97.6 F (36.4 C)  TempSrc:      SpO2: 96% 95% 96% 95%  Weight:    72.5 kg  Height:        Intake/Output from previous day:  Intake/Output Summary (Last 24 hours) at 06/30/2020 0751 Last data filed at 06/30/2020 08/30/2020 Gross per 24 hour  Intake 480 ml  Output 1500 ml  Net -1020 ml    Physical Exam: Affect appropriate Healthy:  appears stated age HEENT: normal Neck supple with no adenopathy JVP normal no bruits no thyromegaly Lungs clear with no wheezing and good diaphragmatic motion Heart:  S1/S2 no murmur, no rub, gallop or click PMI normal Abdomen: benighn, BS positve, no tenderness, no AAA no bruit.  No HSM or HJR Distal pulses intact with no bruits No edema Neuro non-focal Skin warm and dry No muscular weakness   Lab Results: Basic Metabolic Panel: Recent Labs    06/28/20 0357 06/29/20 0252  NA 134* 137  K 4.1 4.8  CL 94* 96*  CO2 30 32  GLUCOSE 99 110*  BUN 25* 33*  CREATININE 0.98 1.17*  CALCIUM 8.8* 9.0  MG  --  1.9   Liver Function Tests: No results for input(s): AST, ALT, ALKPHOS, BILITOT, PROT, ALBUMIN in the last 72 hours. No results for input(s): LIPASE, AMYLASE in the last 72 hours. CBC: Recent Labs    06/28/20 0357  WBC 8.3  HGB 12.2  HCT 36.5  MCV 96.1  PLT 294    Imaging: No results found.  Cardiac Studies:  ECG: afib non specific ST changes    Telemetry:  afib rates 80's   Echo: EF 55% moderate LAE mild MR  LA 44 mm   Medications:    apixaban  5 mg Oral BID   atorvastatin  10 mg Oral Daily   carvedilol  3.125 mg Oral BID WC   diltiazem  120 mg Oral Daily   furosemide  40 mg Intravenous Daily   irbesartan  75 mg Oral Daily    levothyroxine  125 mcg Oral Daily   nitrofurantoin (macrocrystal-monohydrate)  100 mg Oral Q12H   sodium chloride flush  3 mL Intravenous Q12H      sodium chloride      Assessment/Plan:   1. Afib:  On eliquis rates ok on coreg and cardizem. For TEE/DCC in am She indicates some problems Swallowing large pills put no food dysphagia.  2. Diastolic CHF:  sats still a bit low but good diuresis and lungs clear continue lasix change to PO CXR 06/24/20 NAD  3. HTN:  Well controlled.  Continue current medications and low sodium Dash type diet.   4. HLD on statin   08/24/20 06/30/2020, 7:51 AM

## 2020-06-30 NOTE — Progress Notes (Signed)
PROGRESS NOTE    [name]  BJS:[id number] DOB: [birth year] DOA: 06/24/2020 PCP: Everlean Cherry, MD    Brief Narrative:  Patient is a 86 year old female with history of coronary artery disease, chronic diastolic heart failure with EF 65%, grade 2 diastolic dysfunction, persistent A. fib on Eliquis, right bundle branch block, hypertension, hyperlipidemia and osteoarthritis along with chronic back pain presented to the ER with progressive lower extremity edema and worsening shortness of breath with exertion.  Chest x-ray showed cardiomegaly without focal consolidation.  Also had recent cat bite on her right lower extremity. Admitted with A. fib and diastolic heart failure exacerbation.   Assessment & Plan:   Principal Problem:   Acute on chronic diastolic (congestive) heart failure (HCC) Active Problems:   Essential hypertension   Hypercholesterolemia   CAD (coronary artery disease)   Hypothyroidism   Persistent atrial fibrillation (HCC)   Cat bite of right lower leg  Acute on chronic diastolic heart failure exacerbation: Treated with aggressive diuresis with improvement of symptoms including improvement of PND. Currently remains on heart failure therapy with carvedilol, irbesartan. Remains on IV diuresis with 6 L negative balance today. Continue diuresis. Will discontinue irbesartan today due to significant orthostatic hypotension.  Chronic A. fib: Rate controlled on Coreg and Cardizem.  Therapeutic on Eliquis.  Plan for DC cardioversion on 10/12.  Coronary artery disease: Stable without chest pain.  Hypertension: Blood pressures low normal, positive orthostatic.  Discontinue irbesartan.  We will continue rate control medications.  Gross hematuria/UTI: History of recurrent UTI.  Urine culture with 80,000 Citrobacter.  3 days of nitrofurantoin.  Hypothyroidism: Increase Synthroid from 100 to 125 mcg with TSH of 8.5.  Cat bite: Local dressing.  Generalized weakness:  Physical deconditioning.  Work with PT OT.  Refer to a skilled nursing facility placement before going back to home with independent living.   DVT prophylaxis:  apixaban (ELIQUIS) tablet 5 mg   Code Status: DNR Family Communication: Son on the phone Disposition Plan: Status is: Inpatient  Remains inpatient appropriate because:Inpatient level of care appropriate due to severity of illness   Dispo: The patient is from: Home              Anticipated d/c is to: SNF              Anticipated d/c date is: 2 days              Patient currently is not medically stable to d/c.         Consultants:   Cardiology  Procedures:   None  Antimicrobials:  Anti-infectives (From admission, onward)   Start     Dose/Rate Route Frequency Ordered Stop   06/29/20 1145  nitrofurantoin (macrocrystal-monohydrate) (MACROBID) capsule 100 mg        100 mg Oral Every 12 hours 06/29/20 1058 07/02/20 0959   06/27/20 1500  cefTRIAXone (ROCEPHIN) 1 g in sodium chloride 0.9 % 100 mL IVPB  Status:  Discontinued        1 g 200 mL/hr over 30 Minutes Intravenous Every 24 hours 06/27/20 1255 06/29/20 1058   06/24/20 1400  amoxicillin-clavulanate (AUGMENTIN) 875-125 MG per tablet 1 tablet  Status:  Discontinued        1 tablet Oral 2 times daily 06/24/20 1353 06/27/20 1255         Subjective: Patient was seen and examined.  No overnight events.  She was working with occupational therapy, on moving from chair to bed she felt  dizzy and improved with laying back.  She is not waking up at night with shortness of breath since last 2 days.  Denies any chest pain. Telemetry shows A. fib with rate controlled rhythm.  No urinary complaints today.  Objective: Vitals:   06/30/20 0753 06/30/20 0803 06/30/20 0920 06/30/20 1200  BP: 123/74 109/72 123/74 (!) 86/61  Pulse: 70   92  Resp: 13   17  Temp: 97.6 F (36.4 C)   97.6 F (36.4 C)  TempSrc:    Oral  SpO2:    90%  Weight:      Height:         Intake/Output Summary (Last 24 hours) at 06/30/2020 1454 Last data filed at 06/30/2020 1033 Gross per 24 hour  Intake 363 ml  Output 1050 ml  Net -687 ml   Filed Weights   06/28/20 0500 06/29/20 0125 06/30/20 0553  Weight: 73.4 kg 73.8 kg 72.5 kg    Examination:  General exam: Appears calm and comfortable on room air. Respiratory system: Clear to auscultation. Respiratory effort normal.  No added sounds. Cardiovascular system: S1 & S2 heard, irregularly irregular.   Bilateral pedal edema 1+, right more than left. Gastrointestinal system: Abdomen is nondistended, soft and nontender. No organomegaly or masses felt. Normal bowel sounds heard. Central nervous system: Alert and oriented. No focal neurological deficits. Extremities: Symmetric 5 x 5 power. Skin: No rashes, lesions or ulcers Psychiatry: Judgement and insight appear normal. Mood & affect appropriate.     Data Reviewed: I have personally reviewed following labs and imaging studies  CBC: Recent Labs  Lab 06/24/20 1340 06/24/20 1340 06/25/20 0811 06/26/20 0606 06/27/20 0325 06/28/20 0357 06/30/20 0726  WBC 7.4   < > 8.3 8.1 8.4 8.3 7.4  NEUTROABS 5.6  --   --   --   --   --   --   HGB 12.3   < > 11.8* 11.5* 12.4 12.2 13.0  HCT 35.9*   < > 35.0* 35.0* 36.5 36.5 39.4  MCV 96.8   < > 97.0 96.4 98.1 96.1 97.0  PLT 289   < > 296 282 240 294 288   < > = values in this interval not displayed.   Basic Metabolic Panel: Recent Labs  Lab 06/25/20 0811 06/25/20 0811 06/26/20 0606 06/27/20 0325 06/28/20 0357 06/29/20 0252 06/30/20 0726  NA 138   < > 138 135 134* 137 137  K 3.6   < > 3.6 4.4 4.1 4.8 4.6  CL 98   < > 97* 97* 94* 96* 97*  CO2 30   < > 31 29 30  32 32  GLUCOSE 140*   < > 94 99 99 110* 99  BUN 16   < > 19 24* 25* 33* 27*  CREATININE 1.06*   < > 0.96 1.02* 0.98 1.17* 1.02*  CALCIUM 8.8*   < > 8.9 9.0 8.8* 9.0 9.1  MG 1.8  --   --   --   --  1.9 2.1   < > = values in this interval not displayed.    GFR: Estimated Creatinine Clearance: 42.8 mL/min (A) (by C-G formula based on SCr of 1.02 mg/dL (H)). Liver Function Tests: No results for input(s): AST, ALT, ALKPHOS, BILITOT, PROT, ALBUMIN in the last 168 hours. No results for input(s): LIPASE, AMYLASE in the last 168 hours. No results for input(s): AMMONIA in the last 168 hours. Coagulation Profile: No results for input(s): INR, PROTIME in the last 168  hours. Cardiac Enzymes: No results for input(s): CKTOTAL, CKMB, CKMBINDEX, TROPONINI in the last 168 hours. BNP (last 3 results) No results for input(s): PROBNP in the last 8760 hours. HbA1C: No results for input(s): HGBA1C in the last 72 hours. CBG: No results for input(s): GLUCAP in the last 168 hours. Lipid Profile: No results for input(s): CHOL, HDL, LDLCALC, TRIG, CHOLHDL, LDLDIRECT in the last 72 hours. Thyroid Function Tests: No results for input(s): TSH, T4TOTAL, FREET4, T3FREE, THYROIDAB in the last 72 hours. Anemia Panel: No results for input(s): VITAMINB12, FOLATE, FERRITIN, TIBC, IRON, RETICCTPCT in the last 72 hours. Sepsis Labs: No results for input(s): PROCALCITON, LATICACIDVEN in the last 168 hours.  Recent Results (from the past 240 hour(s))  Respiratory Panel by RT PCR (Flu A&B, Covid) - Nasopharyngeal Swab     Status: None   Collection Time: 06/24/20  1:40 PM   Specimen: Nasopharyngeal Swab  Result Value Ref Range Status   SARS Coronavirus 2 by RT PCR NEGATIVE NEGATIVE Final    Comment: (NOTE) SARS-CoV-2 target nucleic acids are NOT DETECTED.  The SARS-CoV-2 RNA is generally detectable in upper respiratoy specimens during the acute phase of infection. The lowest concentration of SARS-CoV-2 viral copies this assay can detect is 131 copies/mL. A negative result does not preclude SARS-Cov-2 infection and should not be used as the sole basis for treatment or other patient management decisions. A negative result may occur with  improper specimen  collection/handling, submission of specimen other than nasopharyngeal swab, presence of viral mutation(s) within the areas targeted by this assay, and inadequate number of viral copies (<131 copies/mL). A negative result must be combined with clinical observations, patient history, and epidemiological information. The expected result is Negative.  Fact Sheet for Patients:  https://www.moore.com/  Fact Sheet for Healthcare Providers:  https://www.young.biz/  This test is no t yet approved or cleared by the [place] FDA and  has been authorized for detection and/or diagnosis of SARS-CoV-2 by FDA under an Emergency Use Authorization (EUA). This EUA will remain  in effect (meaning this test can be used) for the duration of the COVID-19 declaration under Section 564(b)(1) of the Act, 21 U.S.C. section 360bbb-3(b)(1), unless the authorization is terminated or revoked sooner.     Influenza A by PCR NEGATIVE NEGATIVE Final   Influenza B by PCR NEGATIVE NEGATIVE Final    Comment: (NOTE) The Xpert Xpress SARS-CoV-2/FLU/RSV assay is intended as an aid in  the diagnosis of influenza from Nasopharyngeal swab specimens and  should not be used as a sole basis for treatment. Nasal washings and  aspirates are unacceptable for Xpert Xpress SARS-CoV-2/FLU/RSV  testing.  Fact Sheet for Patients: https://www.moore.com/  Fact Sheet for Healthcare Providers: https://www.young.biz/  This test is not yet approved or cleared by the [place] FDA and  has been authorized for detection and/or diagnosis of SARS-CoV-2 by  FDA under an Emergency Use Authorization (EUA). This EUA will remain  in effect (meaning this test can be used) for the duration of the  Covid-19 declaration under Section 564(b)(1) of the Act, 21  U.S.C. section 360bbb-3(b)(1), unless the authorization is  terminated or revoked. Performed at [facility], [address] [place], [place] [address]   Culture, Urine     Status: Abnormal   Collection Time: 06/27/20 10:10 AM   Specimen: Urine, Clean Catch  Result Value Ref Range Status   Specimen Description URINE, CLEAN CATCH  Final   Special Requests   Final  NONE Performed at [facility], [address] [place], [place] [address]    Culture 80,000 COLONIES/mL CITROBACTER FREUNDII (A)  Final   Report Status 06/29/2020 FINAL  Final   Organism ID, Bacteria CITROBACTER FREUNDII (A)  Final      Susceptibility   Citrobacter freundii - MIC*    CEFAZOLIN >=64 RESISTANT Resistant     CEFTRIAXONE >=64 RESISTANT Resistant     CIPROFLOXACIN <=0.25 SENSITIVE Sensitive     GENTAMICIN <=1 SENSITIVE Sensitive     IMIPENEM 0.5 SENSITIVE Sensitive     NITROFURANTOIN <=16 SENSITIVE Sensitive     TRIMETH/SULFA <=20 SENSITIVE Sensitive     PIP/TAZO >=128 RESISTANT Resistant     * 80,000 COLONIES/mL CITROBACTER FREUNDII         Radiology Studies: No results found.      Scheduled Meds: . apixaban  5 mg Oral BID  . atorvastatin  10 mg Oral Daily  . carvedilol  3.125 mg Oral BID WC  . diltiazem  120 mg Oral Daily  . furosemide  40 mg Intravenous Daily  . levothyroxine  125 mcg Oral Daily  . nitrofurantoin (macrocrystal-monohydrate)  100 mg Oral Q12H  . sodium chloride flush  3 mL Intravenous Q12H   Continuous Infusions: . sodium chloride       LOS: 5 days    Time spent: 35 minutes    Dorcas CarrowKuber Honestii Marton, MD [facility] Pager [phone]

## 2020-06-30 NOTE — TOC Initial Note (Addendum)
Transition of Care [facility]) - Initial/Assessment Note    Patient Details  Name: [name] MRN: [id number] Date of Birth: 1932/01/08  Transition of Care [facility]) CM/SW Contact:    Carmina Miller, LCSWA Phone Number: 06/30/2020, 4:00 PM  Clinical Narrative:                 CSW received consult for possible SNF placement at time of discharge. CSW spoke with patient regarding PT recommendation of SNF placement at time of discharge. Patient reported that she is currently unable to care for herself at her home given patient's current physical needs and fall risk. Patient expressed understanding of PT recommendation and is will think about going to SNF placement closer to discharge. Patient states she would like for someone to come and stay with her over going to a SNF.Patient reports that is she goes to SNF, her preference is for [facility] as this is where she lives, but not [place]. CSW discussed insurance authorization process and will provide Medicare SNF ratings list if patient chooses SNF. Patient has received the COVID vaccines. Patient gave permission to speak with her son, Lenzi Marmo [phone]. No further questions reported at this time. CSW to continue to follow and assist with discharge planning needs.  Expected Discharge Plan: Skilled Nursing Facility Barriers to Discharge: Continued Medical Work up, English as a second language teacher   Patient Goals and CMS Choice Patient states their goals for this hospitalization and ongoing recovery are:: get better CMS Medicare.gov Compare Post Acute Care list provided to:: Patient Choice offered to / list presented to : Patient  Expected Discharge Plan and Services Expected Discharge Plan: Skilled Nursing Facility In-house Referral: Clinical Social Work     Living arrangements for the past 2 months: Single Family Home                 DME Arranged: Walker rolling with seat DME Agency: [employer] Date DME Agency Contacted: 06/27/20 Time DME Agency  Contacted: [phone] Representative spoke with at DME Agency: NA HH Arranged: NA HH Agency: NA Date HH Agency Contacted: 06/27/20 Time HH Agency Contacted: [phone] Representative spoke with at [facility] Agency: NA  Prior Living Arrangements/Services Living arrangements for the past 2 months: Single Family Home Lives with:: Spouse Patient language and need for interpreter reviewed:: Yes Do you feel safe going back to the place where you live?: No   Needs more support  Need for Family Participation in Patient Care: No (Comment) Care giver support system in place?: Yes (comment)   Criminal Activity/Legal Involvement Pertinent to Current Situation/Hospitalization: No - Comment as needed  Activities of Daily Living      Permission Sought/Granted Permission sought to share information with : Case Manager, Family Supports, Magazine features editor Permission granted to share information with : Yes, Release of Information Signed  Share Information with NAME: Cristino Martes  Permission granted to share info w AGENCY: SNFs  Permission granted to share info w Relationship: Spouse  Permission granted to share info w Contact Information: [phone]  Emotional Assessment Appearance:: Appears stated age Attitude/Demeanor/Rapport: Gracious Affect (typically observed): Accepting Orientation: : Oriented to Self, Oriented to Place, Oriented to  Time, Oriented to Situation Alcohol / Substance Use: Not Applicable Psych Involvement: No (comment)  Admission diagnosis:  Cat bite, initial encounter [W55.01XA] Hypervolemia, unspecified hypervolemia type [E87.70] Acute on chronic diastolic (congestive) heart failure (HCC) [I50.33] Heart failure, unspecified HF chronicity, unspecified heart failure type [facility]) [I50.9] Patient Active Problem List   Diagnosis Date Noted  . Acute  on chronic diastolic (congestive) heart failure (HCC) 06/24/2020  . Cat bite of right lower leg 06/24/2020  . Persistent atrial  fibrillation (HCC) 06/18/2020  . Moderate tricuspid regurgitation 06/18/2020  . Hypothyroidism   . CAD (coronary artery disease) 01/02/2020  . Abnormal nuclear stress test 11/15/2019  . Essential hypertension 09/10/2019  . Mitral regurgitation 09/10/2019  . Palpitations 09/10/2019  . Dyspnea on exertion 09/10/2019  . Chest tightness 09/10/2019  . Osteoporosis 08/24/2018  . Fall at home 07/17/2018  . PVC (premature ventricular contraction) 04/17/2018  . Pulmonary hypertension (HCC) 12/08/2017  . Former smoker 10/20/2017  . RBBB 10/20/2017  . Drug therapy 12/11/2015  . Idiopathic chronic gout of multiple sites without tophus 12/11/2015  . Idiopathic peripheral neuropathy 12/11/2015  . Recurrent UTI 12/11/2015  . Spinal stenosis of lumbar region 12/11/2015  . Acute cystitis without hematuria 11/15/2014  . Gross hematuria 10/08/2014  . Abnormal perfusion scintigraphy 07/22/2014  . Abnormal results of function studies of other organs and systems 07/22/2014  . B12 deficiency 07/22/2014  . Benign hypertensive heart and kidney disease with chronic kidney disease 07/22/2014  . Chronic kidney disease, stage 2 (mild) 07/22/2014  . Degeneration, intervertebral disc, cervical 07/22/2014  . Depression, major, single episode, moderate (HCC) 07/22/2014  . Drug rash 07/22/2014  . Fatigue 07/22/2014  . Anxiety disorder 07/22/2014  . Hypercholesterolemia 07/22/2014  . Hypertonicity of bladder 07/22/2014  . Gout 07/22/2014  . Hypothyroidism (acquired) 07/22/2014  . Increased frequency of urination 07/22/2014  . Left atrial enlargement 07/22/2014  . Lethargy 07/22/2014  . Low back pain 07/22/2014  . Obesity 07/22/2014  . Onychomycosis due to dermatophyte 07/22/2014  . Osteoarthritis, generalized 07/22/2014  . Osteopenia 07/22/2014  . Sciatica 07/22/2014  . Venous stasis dermatitis of both lower extremities 07/22/2014  . Venous insufficiency 07/22/2014  . Varicose veins 07/22/2014  . Urge  incontinence of urine 07/22/2014  . Unspecified abnormalities of gait and mobility 07/22/2014  . TMJ arthralgia 07/22/2014  . Vitamin D deficiency 07/22/2014  . Urinary tract infection, site not specified 04/04/2014  . HYPOTHYROIDISM, POST-RADIATION 06/11/2009  . URINARY INCONTINENCE 06/11/2009   PCP:  Everlean Cherry, MD Pharmacy:   [facility] [address] [place] - [address] [place] [address] Phone: [phone] Fax: [phone]  [facility] [place], [place] - [address] [place] [address] Phone: [phone] Fax: [phone]     Social Determinants of Health (SDOH) Interventions    Readmission Risk Interventions No flowsheet data found.

## 2020-06-30 NOTE — Progress Notes (Signed)
Occupational Therapy Treatment Patient Details Name: [name] MRN: [id number] DOB: [birth year] Today's Date: 06/30/2020    History of present illness [name] is a 86 y.o. female with medical history significant for CAD, chronic diastolic CHF (EF 26-83%, G2 DD by TTE 10/31/2019), persistent atrial fibrillation on Eliquis, RBBB, hypertension, hyperlipidemia, and osteoarthritis and chronic back pain who presents to the ED for evaluation of progressive lower extremity edema.   OT comments  Pt making steady progress towards OT goals this session. Pt continues to be hypotensive with movement see vitals below. Overall, pt requires min guard assist with RW to complete stand pivot transfer from EOB>recliner. Pt was able to take steps today, ~ 25ft with close chair follow as precursor to higher level functional mobility. Pt is symptomatic requesting to sit down after minimal functional mobility. Pt currently requires supervision for UB ADLs and supervision fro LB ADLs from EOB. Pt reports living at home alone. Feel pt would currently require 24/7 supervision d/t BP issues. Updated DC recs to SNF however pt would prefer home with [facility]. Will continue to follow acutely per POC and updated DC recs as needed/.   BP from supine 109/72, BP post transfer from chair 92/70 BP after sitting in chair ~ 3 mins with legs elevated 119/76   Follow Up Recommendations  Home health OT;Supervision/Assistance - 24 hour    Equipment Recommendations  None recommended by OT    Recommendations for Other Services      Precautions / Restrictions Precautions Precautions: Fall Precaution Comments: watch BP Restrictions Weight Bearing Restrictions: No       Mobility Bed Mobility Overal bed mobility: Needs Assistance Bed Mobility: Supine to Sit     Supine to sit: Min guard;HOB elevated     General bed mobility comments: pt able to transition from supine>sitting with min guard assist for safety with HOB  elevated  Transfers Overall transfer level: Needs assistance Equipment used: Rolling walker (2 wheeled) Transfers: Sit to/from [employer] Sit to Stand: Min guard Stand pivot transfers: Min guard       General transfer comment: pt required min guard assist with RW to complete stand pivot transfer from EOB>recliner howwever pt continues to report feeling lightheaded during mobility    Balance Overall balance assessment: Needs assistance Sitting-balance support: Feet supported;No upper extremity supported Sitting balance-Leahy Scale: Good Sitting balance - Comments: pt able to complete LB ADL with no LOB   Standing balance support: Bilateral upper extremity supported;During functional activity Standing balance-Leahy Scale: Poor Standing balance comment: relies on UE support                           ADL either performed or assessed with clinical judgement   ADL Overall ADL's : Needs assistance/impaired     Grooming: Oral care;Brushing hair;Sitting;Supervision/safety;Set up Grooming Details (indicate cue type and reason): able to complete UB ADLS from EOB with set-up - supervision assist             Lower Body Dressing: Supervision/safety;Sitting/lateral leans Lower Body Dressing Details (indicate cue type and reason): pt able to adjust socks from EOB with supervision assist with no LOB when reaching out of BOS Toilet Transfer: Min guard;RW;Stand-pivot Toilet Transfer Details (indicate cue type and reason): simulated via functional mobility from EOB>recliner with RW and min guard assist for safety. pt was able to ambulate ~ 5 ft with RW and MIN guard assist as precursor to higher level functional  transfers, however pt continues to be limited by hyoptension with pt symptomatic requesting to sit down         Functional mobility during ADLs: Min guard;Rolling walker General ADL Comments: pt continues to be limited by hypotension with mobility.      Vision       Perception     Praxis      Cognition Arousal/Alertness: Awake/alert Behavior During Therapy: WFL for tasks assessed/performed Overall Cognitive Status: Within Functional Limits for tasks assessed                                          Exercises     Shoulder Instructions       General Comments BP from supine 109/72, BP after transferring to chair 92/70, BP after sitting in chair ~ 3 mins with legs elevated 119/76.    Pertinent Vitals/ Pain       Pain Assessment: No/denies pain  Home Living                                          Prior Functioning/Environment              Frequency  Min 2X/week        Progress Toward Goals  OT Goals(current goals can now be found in the care plan section)  Progress towards OT goals: Progressing toward goals  Acute Rehab OT Goals Patient Stated Goal: Decrease edema to move better OT Goal Formulation: With patient Time For Goal Achievement: 07/09/20 Potential to Achieve Goals: Good  Plan Discharge plan needs to be updated;Frequency needs to be updated    Co-evaluation                 AM-PAC OT "6 Clicks" Daily Activity     Outcome Measure   Help from another person eating meals?: None Help from another person taking care of personal grooming?: None Help from another person toileting, which includes using toliet, bedpan, or urinal?: A Little Help from another person bathing (including washing, rinsing, drying)?: A Little Help from another person to put on and taking off regular upper body clothing?: None Help from another person to put on and taking off regular lower body clothing?: A Little 6 Click Score: 21    End of Session Equipment Utilized During Treatment: Gait belt;Rolling walker  OT Visit Diagnosis: Muscle weakness (generalized) (M62.81);History of falling (Z91.81);Pain   Activity Tolerance Patient tolerated treatment well   Patient Left in  chair;with call bell/phone within reach   Nurse Communication Mobility status        Time: 8657-8469 OT Time Calculation (min): 26 min  Charges: OT General Charges $OT Visit: 1 Visit OT Treatments $Self Care/Home Management : 23-37 mins  Victoria Amel., COTA/L Acute Rehabilitation Services [phone]    [name] 06/30/2020, 9:35 AM

## 2020-06-30 NOTE — Progress Notes (Signed)
   06/30/20 0753  Assess: MEWS Score  Temp 97.6 F (36.4 C)  BP 123/74  Pulse Rate 70  ECG Heart Rate 85  Resp 13  Level of Consciousness Alert  Assess: MEWS Score  MEWS Temp 0  MEWS Systolic 0  MEWS Pulse 0  MEWS RR 1  MEWS LOC 0  MEWS Score 1  MEWS Score Color Green  Assess: if the MEWS score is Yellow or Red  Were vital signs taken at a resting state? Yes  Focused Assessment No change from prior assessment  Early Detection of Sepsis Score *See Row Information* Low  MEWS guidelines implemented *See Row Information* No, vital signs rechecked  Treat  MEWS Interventions Other (Comment) (N/A; Vital signs rechecked)  Pain Scale 0-10  Pain Score 0

## 2020-06-30 NOTE — Care Management Important Message (Signed)
Important Message  Patient Details  Name: [name] MRN: [id number] Date of Birth: 06-20-1932   Medicare Important Message Given:  Yes     Renie Ora 06/30/2020, 10:43 AM

## 2020-07-01 ENCOUNTER — Encounter (HOSPITAL_COMMUNITY): Payer: Self-pay | Admitting: Internal Medicine

## 2020-07-01 ENCOUNTER — Inpatient Hospital Stay (HOSPITAL_COMMUNITY)
Admit: 2020-07-01 | Discharge: 2020-07-01 | Disposition: A | Discharge status: Home / Self Care | Payer: Medicare PPO | Attending: Physician Assistant | Admitting: Physician Assistant

## 2020-07-01 ENCOUNTER — Inpatient Hospital Stay (HOSPITAL_COMMUNITY): Payer: Medicare PPO | Admitting: Anesthesiology

## 2020-07-01 ENCOUNTER — Other Ambulatory Visit: Payer: Self-pay

## 2020-07-01 ENCOUNTER — Encounter (HOSPITAL_COMMUNITY)
Admission: EM | Disposition: A | Discharge status: Skilled Nursing Facility | Payer: Medicare PPO | Source: Home / Self Care | Attending: Internal Medicine

## 2020-07-01 ENCOUNTER — Inpatient Hospital Stay (HOSPITAL_COMMUNITY): Payer: Medicare PPO

## 2020-07-01 DIAGNOSIS — Z9289 Personal history of other medical treatment: Secondary | ICD-10-CM

## 2020-07-01 DIAGNOSIS — Z9889 Other specified postprocedural states: Secondary | ICD-10-CM

## 2020-07-01 DIAGNOSIS — I4819 Other persistent atrial fibrillation: Secondary | ICD-10-CM | POA: Diagnosis not present

## 2020-07-01 DIAGNOSIS — I340 Nonrheumatic mitral (valve) insufficiency: Secondary | ICD-10-CM | POA: Diagnosis not present

## 2020-07-01 DIAGNOSIS — I361 Nonrheumatic tricuspid (valve) insufficiency: Secondary | ICD-10-CM

## 2020-07-01 DIAGNOSIS — I5033 Acute on chronic diastolic (congestive) heart failure: Secondary | ICD-10-CM | POA: Diagnosis not present

## 2020-07-01 HISTORY — PX: CARDIOVERSION: SHX1299

## 2020-07-01 HISTORY — DX: Other specified postprocedural states: Z98.890

## 2020-07-01 HISTORY — PX: TEE WITHOUT CARDIOVERSION: SHX5443

## 2020-07-01 HISTORY — DX: Personal history of other medical treatment: Z92.89

## 2020-07-01 SURGERY — ECHOCARDIOGRAM, TRANSESOPHAGEAL
Anesthesia: General

## 2020-07-01 MED ORDER — PROPOFOL 500 MG/50ML IV EMUL
INTRAVENOUS | Status: DC | PRN
  Administered 2020-07-01: 50 ug/kg/min via INTRAVENOUS

## 2020-07-01 MED ORDER — BUTAMBEN-TETRACAINE-BENZOCAINE 2-2-14 % EX AERO
INHALATION_SPRAY | CUTANEOUS | Status: DC | PRN
  Administered 2020-07-01: 2 via TOPICAL

## 2020-07-01 MED ORDER — SODIUM CHLORIDE 0.9 % IV SOLN: INTRAVENOUS | Status: DC

## 2020-07-01 MED ORDER — PHENYLEPHRINE 40 MCG/ML (10ML) SYRINGE FOR IV PUSH (FOR BLOOD PRESSURE SUPPORT)
PREFILLED_SYRINGE | INTRAVENOUS | Status: DC | PRN
  Administered 2020-07-01 (×2): 80 ug via INTRAVENOUS

## 2020-07-01 MED ORDER — PROPOFOL 10 MG/ML IV BOLUS
INTRAVENOUS | Status: DC | PRN
  Administered 2020-07-01 (×2): 5 mg via INTRAVENOUS

## 2020-07-01 MED ORDER — LIDOCAINE 2% (20 MG/ML) 5 ML SYRINGE
INTRAMUSCULAR | Status: DC | PRN
  Administered 2020-07-01: 20 mg via INTRAVENOUS

## 2020-07-01 NOTE — Anesthesia Preprocedure Evaluation (Addendum)
Anesthesia Evaluation  Patient identified by MRN, date of birth, ID band Patient awake    Reviewed: Allergy & Precautions, H&P , NPO status , Patient's Chart, lab work & pertinent test results, reviewed documented beta blocker date and time   Airway Mallampati: II  TM Distance: >3 FB Neck ROM: Full    Dental no notable dental hx. (+) Teeth Intact, Dental Advisory Given   Pulmonary neg pulmonary ROS,    Pulmonary exam normal breath sounds clear to auscultation       Cardiovascular hypertension, Pt. on medications and Pt. on home beta blockers + CAD and +CHF  + dysrhythmias Atrial Fibrillation  Rhythm:Irregular Rate:Normal     Neuro/Psych Anxiety Depression negative neurological ROS     GI/Hepatic negative GI ROS, Neg liver ROS,   Endo/Other  Hypothyroidism   Renal/GU Renal InsufficiencyRenal disease  negative genitourinary   Musculoskeletal  (+) Arthritis , Osteoarthritis,    Abdominal   Peds  Hematology negative hematology ROS (+)   Anesthesia Other Findings   Reproductive/Obstetrics negative OB ROS                            Anesthesia Physical Anesthesia Plan  ASA: III  Anesthesia Plan: General   Post-op Pain Management:    Induction: Intravenous  PONV Risk Score and Plan: 3 and Propofol infusion, Treatment may vary due to age or medical condition and TIVA  Airway Management Planned: Nasal Cannula  Additional Equipment:   Intra-op Plan:   Post-operative Plan:   Informed Consent: I have reviewed the patients History and Physical, chart, labs and discussed the procedure including the risks, benefits and alternatives for the proposed anesthesia with the patient or authorized representative who has indicated his/her understanding and acceptance.   Patient has DNR.  Discussed DNR with patient and Suspend DNR.   Dental advisory given  Plan Discussed with: CRNA  Anesthesia  Plan Comments:        Anesthesia Quick Evaluation

## 2020-07-01 NOTE — Interval H&P Note (Signed)
History and Physical Interval Note:  07/01/2020 11:52 AM  [name]  has presented today for surgery, with the diagnosis of AFIB.  The various methods of treatment have been discussed with the patient and family. After consideration of risks, benefits and other options for treatment, the patient has consented to  Procedure(s): TRANSESOPHAGEAL ECHOCARDIOGRAM (TEE) (N/A) CARDIOVERSION (N/A) as a surgical intervention.  The patient's history has been reviewed, patient examined, no change in status, stable for surgery.  I have reviewed the patient's chart and labs.  Questions were answered to the patient's satisfaction.     Olga Millers

## 2020-07-01 NOTE — Anesthesia Postprocedure Evaluation (Signed)
Anesthesia Post Note  Patient: MADYSEN FAIRCLOTH  Procedure(s) Performed: TRANSESOPHAGEAL ECHOCARDIOGRAM (TEE) (N/A ) CARDIOVERSION (N/A )     Patient location during evaluation: Endoscopy Anesthesia Type: General Level of consciousness: awake and alert Pain management: pain level controlled Vital Signs Assessment: post-procedure vital signs reviewed and stable Respiratory status: spontaneous breathing, nonlabored ventilation, respiratory function stable and patient connected to nasal cannula oxygen Cardiovascular status: blood pressure returned to baseline and stable Postop Assessment: no apparent nausea or vomiting Anesthetic complications: no   No complications documented.  Last Vitals:  Vitals:   07/01/20 1302 07/01/20 1312  BP: (!) 103/47 (!) 109/44  Pulse: (!) 57 (!) 58  Resp: 15 17  Temp:    SpO2: 95% 98%    Last Pain:  Vitals:   07/01/20 1312  TempSrc:   PainSc: 0-No pain                 Georgi Tuel,W. EDMOND

## 2020-07-01 NOTE — H&P (View-Only) (Signed)
    Subjective:  Rates up with any activity Has contact dermatitis on back ? From sheets   Objective:  Vitals:   06/30/20 1200 06/30/20 1614 06/30/20 1950 07/01/20 0553  BP: (!) 86/61 106/71 101/61 122/71  Pulse: 92 71 83 72  Resp: 17 19 18 16  Temp: 97.6 F (36.4 C) (!) 97.4 F (36.3 C) 97.6 F (36.4 C) 98 F (36.7 C)  TempSrc: Oral Oral Oral Oral  SpO2: 90% 92% 98% 97%  Weight:    73.2 kg  Height:        Intake/Output from previous day:  Intake/Output Summary (Last 24 hours) at 07/01/2020 0843 Last data filed at 07/01/2020 0500 Gross per 24 hour  Intake 243 ml  Output 1200 ml  Net -957 ml    Physical Exam: Affect appropriate Healthy:  appears stated age HEENT: normal Neck supple with no adenopathy JVP normal no bruits no thyromegaly Lungs clear with no wheezing and good diaphragmatic motion Heart:  S1/S2 no murmur, no rub, gallop or click PMI normal Abdomen: benighn, BS positve, no tenderness, no AAA no bruit.  No HSM or HJR Distal pulses intact with no bruits No edema Neuro non-focal Skin  Contact dermatitis lower back  No muscular weakness   Lab Results: Basic Metabolic Panel: Recent Labs    06/29/20 0252 06/30/20 0726  NA 137 137  K 4.8 4.6  CL 96* 97*  CO2 32 32  GLUCOSE 110* 99  BUN 33* 27*  CREATININE 1.17* 1.02*  CALCIUM 9.0 9.1  MG 1.9 2.1   Liver Function Tests: No results for input(s): AST, ALT, ALKPHOS, BILITOT, PROT, ALBUMIN in the last 72 hours. No results for input(s): LIPASE, AMYLASE in the last 72 hours. CBC: Recent Labs    06/30/20 0726  WBC 7.4  HGB 13.0  HCT 39.4  MCV 97.0  PLT 288    Imaging: No results found.  Cardiac Studies:  ECG: afib non specific ST changes    Telemetry:  afib rates 80's   Echo: EF 55% moderate LAE mild MR  LA 44 mm   Medications:   . apixaban  5 mg Oral BID  . atorvastatin  10 mg Oral Daily  . carvedilol  3.125 mg Oral BID WC  . diltiazem  120 mg Oral Daily  . furosemide  40 mg  Intravenous Daily  . levothyroxine  125 mcg Oral Daily  . nitrofurantoin (macrocrystal-monohydrate)  100 mg Oral Q12H  . sodium chloride flush  3 mL Intravenous Q12H     . sodium chloride      Assessment/Plan:   1. Afib:  On eliquis , rate control  on coreg and cardizem. For TEE/DCC with Dr Crenshaw 1:00 today Procedure discussed and consent signed  She indicates some problems Swallowing large pills put no food dysphagia.   2. Diastolic CHF:  sats still a bit low but good diuresis and lungs clear continue lasix change to PO CXR 06/24/20 NAD   3. HTN:  Well controlled.  Continue current medications and low sodium Dash type diet.    4. HLD on statin   Enola Siebers 07/01/2020, 8:43 AM    

## 2020-07-01 NOTE — Progress Notes (Signed)
Physical Therapy Treatment Patient Details Name: [name] MRN: [id number] DOB: January 13, 1932 Today's Date: 07/01/2020    History of Present Illness [name] is a 86 y.o. female with medical history significant for CAD, chronic diastolic CHF (EF 10-25%, G2 DD by TTE 10/31/2019), persistent atrial fibrillation on Eliquis, RBBB, hypertension, hyperlipidemia, and osteoarthritis and chronic back pain who presents to the ED for evaluation of progressive lower extremity edema.    PT Comments    Pt in Afib during session with increase in HR and pulse during activity, therapist allowed pt to rest but due to increase in HR and pulse during seated exercises pt returned supine to bed and RN notified; O2 remained 100% while on nasal canula; pt able to perform supine<>sit mod I with HOB elevated requiring S during lateral scooting up in bed to improve positioning for line management; pt continues to demonstrate deficits in balance, strength, coordination, endurance and gait. Pt will benefit from skilled PT intervention to address deficits to maximize independence with functional mobility prior to discharge.     Follow Up Recommendations  SNF     Equipment Recommendations  None recommended by PT;Other (comment) (defer to next facility)    Recommendations for Other Services       Precautions / Restrictions      Mobility  Bed Mobility Overal bed mobility: Modified Independent Bed Mobility: Supine to Sit;Sit to Supine     Supine to sit: Modified independent (Device/Increase time);HOB elevated Sit to supine: Modified independent (Device/Increase time);HOB elevated      Transfers Overall transfer level: Needs assistance               General transfer comment: pt performed lateral scooting up bed to improve positioning in bed; pt requiring S during activity  Ambulation/Gait                 Stairs             Wheelchair Mobility    Modified Rankin (Stroke  Patients Only)       Balance                                            Cognition                                              Exercises General Exercises - Lower Extremity Ankle Circles/Pumps: AROM;Both;10 reps;Seated Long Arc Quad: AROM;Both;10 reps;Seated Hip Flexion/Marching: AROM;Both;10 reps;Seated    General Comments        Pertinent Vitals/Pain      Home Living                      Prior Function            PT Goals (current goals can now be found in the care plan section) Acute Rehab PT Goals Patient Stated Goal: to get stronger PT Goal Formulation: With patient Time For Goal Achievement: 07/09/20 Potential to Achieve Goals: Good Progress towards PT goals: Progressing toward goals    Frequency    Min 2X/week      PT Plan Frequency needs to be updated    Co-evaluation  AM-PAC PT "6 Clicks" Mobility   Outcome Measure  Help needed turning from your back to your side while in a flat bed without using bedrails?: None Help needed moving from lying on your back to sitting on the side of a flat bed without using bedrails?: A Little Help needed moving to and from a bed to a chair (including a wheelchair)?: A Little Help needed standing up from a chair using your arms (e.g., wheelchair or bedside chair)?: A Little Help needed to walk in hospital room?: A Lot Help needed climbing 3-5 steps with a railing? : Total 6 Click Score: 16    End of Session Equipment Utilized During Treatment: Oxygen Activity Tolerance: Treatment limited secondary to medical complications (Comment) Patient left: in bed;with call bell/phone within reach;with bed alarm set Nurse Communication: Mobility status PT Visit Diagnosis: Muscle weakness (generalized) (M62.81)     Time: 4665-9935 PT Time Calculation (min) (ACUTE ONLY): 19 min  Charges:  $Therapeutic Exercise: 8-22 mins                     Ginette Otto,  DPT Acute Rehabilitation Services [phone]   Lucretia Field 07/01/2020, 11:28 AM

## 2020-07-01 NOTE — Transfer of Care (Signed)
Immediate Anesthesia Transfer of Care Note  Patient: [name]  Procedure(s) Performed: TRANSESOPHAGEAL ECHOCARDIOGRAM (TEE) (N/A ) CARDIOVERSION (N/A )  Patient Location: PACU and Endoscopy Unit  Anesthesia Type:MAC  Level of Consciousness: patient cooperative and responds to stimulation  Airway & Oxygen Therapy: Patient Spontanous Breathing and Patient connected to nasal cannula oxygen  Post-op Assessment: Report given to RN and Post -op Vital signs reviewed and stable  Post vital signs: Reviewed and stable  Last Vitals:  Vitals Value Taken Time  BP 93/52 07/01/20 1255  Temp 36.5 C 07/01/20 1253  Pulse 62 07/01/20 1256  Resp 15 07/01/20 1256  SpO2 96 % 07/01/20 1256  Vitals shown include unvalidated device data.  Last Pain:  Vitals:   07/01/20 1253  TempSrc: Oral  PainSc: Asleep      Patients Stated Pain Goal: 0 (76/70/11 0034)  Complications: No complications documented.

## 2020-07-01 NOTE — Progress Notes (Signed)
PROGRESS NOTE    [name]  UQJ:[id number] DOB: [birth year] DOA: 06/24/2020 PCP: Everlean Cherry, MD    Brief Narrative:  Patient is a 86 year old female with history of coronary artery disease, chronic diastolic heart failure with EF 65%, grade 2 diastolic dysfunction, persistent A. fib on Eliquis, right bundle branch block, hypertension, hyperlipidemia and osteoarthritis along with chronic back pain presented to the ER with progressive lower extremity edema and worsening shortness of breath with exertion.  Chest x-ray showed cardiomegaly without focal consolidation.  Also had recent cat bite on her right lower extremity. Admitted with A. fib and diastolic heart failure exacerbation.   Assessment & Plan:   Principal Problem:   Acute on chronic diastolic (congestive) heart failure (HCC) Active Problems:   Essential hypertension   Hypercholesterolemia   CAD (coronary artery disease)   Hypothyroidism   Persistent atrial fibrillation (HCC)   Cat bite of right lower leg  Acute on chronic diastolic heart failure exacerbation: Treated with aggressive diuresis with improvement of symptoms including improvement of PND. Currently remains on heart failure therapy with carvedilol, irbesartan. Remains on IV diuresis. Continue diuresis. Due to significant orthostatic hypotension, irbesartan was discontinued.    Rapid A. fib with underlying chronic A. fib: Heart rate up with activities.  on Coreg and Cardizem.  Therapeutic on Eliquis.  Plan for DC cardioversion today.  Coronary artery disease: Stable without chest pain.  Hypertension: Blood pressures low normal, positive orthostatic.  Discontinue irbesartan.  We will continue rate control medications.  Gross hematuria/UTI: History of recurrent UTI.  Urine culture with 80,000 Citrobacter.  3 days of nitrofurantoin.  No more hematuria.  Hypothyroidism: Increase Synthroid from 100 to 125 mcg with TSH of 8.5.  Cat bite: Local  dressing.  Generalized weakness: Physical deconditioning.  Work with PT OT.  Skilled nursing facility referral.  DVT prophylaxis:  apixaban (ELIQUIS) tablet 5 mg   Code Status: DNR Family Communication: Son on the phone 10/11 Disposition Plan: Status is: Inpatient  Remains inpatient appropriate because:Inpatient level of care appropriate due to severity of illness   Dispo: The patient is from: Home              Anticipated d/c is to: SNF              Anticipated d/c date is: Tomorrow              Patient currently is not medically stable to d/c.         Consultants:   Cardiology  Procedures:   None  Antimicrobials:  Anti-infectives (From admission, onward)   Start     Dose/Rate Route Frequency Ordered Stop   06/29/20 1145  nitrofurantoin (macrocrystal-monohydrate) (MACROBID) capsule 100 mg        100 mg Oral Every 12 hours 06/29/20 1058 07/02/20 0959   06/27/20 1500  cefTRIAXone (ROCEPHIN) 1 g in sodium chloride 0.9 % 100 mL IVPB  Status:  Discontinued        1 g 200 mL/hr over 30 Minutes Intravenous Every 24 hours 06/27/20 1255 06/29/20 1058   06/24/20 1400  amoxicillin-clavulanate (AUGMENTIN) 875-125 MG per tablet 1 tablet  Status:  Discontinued        1 tablet Oral 2 times daily 06/24/20 1353 06/27/20 1255         Subjective: Patient seen and examined.  She was on bedside commode.  Heart rate was as high as 140.  She had some dizziness but not like yesterday.  Blood  pressures fairly stable, drop in orthostatic by 15 points.  Denies any chest pain.  Denies any palpitations.  Seen with cardiology at the bedside. Has some skin rash on the back with itching.  Objective: Vitals:   06/30/20 1200 06/30/20 1614 06/30/20 1950 07/01/20 0553  BP: (!) 86/61 106/71 101/61 122/71  Pulse: 92 71 83 72  Resp: 17 19 18 16   Temp: 97.6 F (36.4 C) (!) 97.4 F (36.3 C) 97.6 F (36.4 C) 98 F (36.7 C)  TempSrc: Oral Oral Oral Oral  SpO2: 90% 92% 98% 97%  Weight:    73.2  kg  Height:        Intake/Output Summary (Last 24 hours) at 07/01/2020 1040 Last data filed at 07/01/2020 0500 Gross per 24 hour  Intake --  Output 850 ml  Net -850 ml   Filed Weights   06/29/20 0125 06/30/20 0553 07/01/20 0553  Weight: 73.8 kg 72.5 kg 73.2 kg    Examination:  General exam: Appears calm and comfortable on room air. Respiratory system: Clear to auscultation. Respiratory effort normal.  No added sounds. Cardiovascular system: S1 & S2 heard, irregularly irregular.  Tachycardic. Bilateral pedal edema 1+, right more than left. Gastrointestinal system: Abdomen is nondistended, soft and nontender. No organomegaly or masses felt. Normal bowel sounds heard. Central nervous system: Alert and oriented. No focal neurological deficits. Extremities: Symmetric 5 x 5 power. Skin: No rashes, lesions or ulcers Psychiatry: Judgement and insight appear normal. Mood & affect appropriate.     Data Reviewed: I have personally reviewed following labs and imaging studies  CBC: Recent Labs  Lab 06/24/20 1340 06/24/20 1340 06/25/20 0811 06/26/20 0606 06/27/20 0325 06/28/20 0357 06/30/20 0726  WBC 7.4   < > 8.3 8.1 8.4 8.3 7.4  NEUTROABS 5.6  --   --   --   --   --   --   HGB 12.3   < > 11.8* 11.5* 12.4 12.2 13.0  HCT 35.9*   < > 35.0* 35.0* 36.5 36.5 39.4  MCV 96.8   < > 97.0 96.4 98.1 96.1 97.0  PLT 289   < > 296 282 240 294 288   < > = values in this interval not displayed.   Basic Metabolic Panel: Recent Labs  Lab 06/25/20 0811 06/25/20 0811 06/26/20 0606 06/27/20 0325 06/28/20 0357 06/29/20 0252 06/30/20 0726  NA 138   < > 138 135 134* 137 137  K 3.6   < > 3.6 4.4 4.1 4.8 4.6  CL 98   < > 97* 97* 94* 96* 97*  CO2 30   < > 31 29 30  32 32  GLUCOSE 140*   < > 94 99 99 110* 99  BUN 16   < > 19 24* 25* 33* 27*  CREATININE 1.06*   < > 0.96 1.02* 0.98 1.17* 1.02*  CALCIUM 8.8*   < > 8.9 9.0 8.8* 9.0 9.1  MG 1.8  --   --   --   --  1.9 2.1   < > = values in this  interval not displayed.   GFR: Estimated Creatinine Clearance: 42.8 mL/min (A) (by C-G formula based on SCr of 1.02 mg/dL (H)). Liver Function Tests: No results for input(s): AST, ALT, ALKPHOS, BILITOT, PROT, ALBUMIN in the last 168 hours. No results for input(s): LIPASE, AMYLASE in the last 168 hours. No results for input(s): AMMONIA in the last 168 hours. Coagulation Profile: No results for input(s): INR, PROTIME in the last  168 hours. Cardiac Enzymes: No results for input(s): CKTOTAL, CKMB, CKMBINDEX, TROPONINI in the last 168 hours. BNP (last 3 results) No results for input(s): PROBNP in the last 8760 hours. HbA1C: No results for input(s): HGBA1C in the last 72 hours. CBG: No results for input(s): GLUCAP in the last 168 hours. Lipid Profile: No results for input(s): CHOL, HDL, LDLCALC, TRIG, CHOLHDL, LDLDIRECT in the last 72 hours. Thyroid Function Tests: No results for input(s): TSH, T4TOTAL, FREET4, T3FREE, THYROIDAB in the last 72 hours. Anemia Panel: No results for input(s): VITAMINB12, FOLATE, FERRITIN, TIBC, IRON, RETICCTPCT in the last 72 hours. Sepsis Labs: No results for input(s): PROCALCITON, LATICACIDVEN in the last 168 hours.  Recent Results (from the past 240 hour(s))  Respiratory Panel by RT PCR (Flu A&B, Covid) - Nasopharyngeal Swab     Status: None   Collection Time: 06/24/20  1:40 PM   Specimen: Nasopharyngeal Swab  Result Value Ref Range Status   SARS Coronavirus 2 by RT PCR NEGATIVE NEGATIVE Final    Comment: (NOTE) SARS-CoV-2 target nucleic acids are NOT DETECTED.  The SARS-CoV-2 RNA is generally detectable in upper respiratoy specimens during the acute phase of infection. The lowest concentration of SARS-CoV-2 viral copies this assay can detect is 131 copies/mL. A negative result does not preclude SARS-Cov-2 infection and should not be used as the sole basis for treatment or other patient management decisions. A negative result may occur with   improper specimen collection/handling, submission of specimen other than nasopharyngeal swab, presence of viral mutation(s) within the areas targeted by this assay, and inadequate number of viral copies (<131 copies/mL). A negative result must be combined with clinical observations, patient history, and epidemiological information. The expected result is Negative.  Fact Sheet for Patients:  https://www.moore.com/https://www.fda.gov/media/142436/download  Fact Sheet for Healthcare Providers:  https://www.young.biz/https://www.fda.gov/media/142435/download  This test is no t yet approved or cleared by the [place] FDA and  has been authorized for detection and/or diagnosis of SARS-CoV-2 by FDA under an Emergency Use Authorization (EUA). This EUA will remain  in effect (meaning this test can be used) for the duration of the COVID-19 declaration under Section 564(b)(1) of the Act, 21 U.S.C. section 360bbb-3(b)(1), unless the authorization is terminated or revoked sooner.     Influenza A by PCR NEGATIVE NEGATIVE Final   Influenza B by PCR NEGATIVE NEGATIVE Final    Comment: (NOTE) The Xpert Xpress SARS-CoV-2/FLU/RSV assay is intended as an aid in  the diagnosis of influenza from Nasopharyngeal swab specimens and  should not be used as a sole basis for treatment. Nasal washings and  aspirates are unacceptable for Xpert Xpress SARS-CoV-2/FLU/RSV  testing.  Fact Sheet for Patients: https://www.moore.com/https://www.fda.gov/media/142436/download  Fact Sheet for Healthcare Providers: https://www.young.biz/https://www.fda.gov/media/142435/download  This test is not yet approved or cleared by the [place] FDA and  has been authorized for detection and/or diagnosis of SARS-CoV-2 by  FDA under an Emergency Use Authorization (EUA). This EUA will remain  in effect (meaning this test can be used) for the duration of the  Covid-19 declaration under Section 564(b)(1) of the Act, 21  U.S.C. section 360bbb-3(b)(1), unless the authorization is  terminated or  revoked. Performed at [facility] [place] [facility], [address] [place], [place] [address]   Culture, Urine     Status: Abnormal   Collection Time: 06/27/20 10:10 AM   Specimen: Urine, Clean Catch  Result Value Ref Range Status   Specimen Description URINE, CLEAN CATCH  Final   Special Requests   Final  NONE Performed at [facility], [address] [place], [place] [address]    Culture 80,000 COLONIES/mL CITROBACTER FREUNDII (A)  Final   Report Status 06/29/2020 FINAL  Final   Organism ID, Bacteria CITROBACTER FREUNDII (A)  Final      Susceptibility   Citrobacter freundii - MIC*    CEFAZOLIN >=64 RESISTANT Resistant     CEFTRIAXONE >=64 RESISTANT Resistant     CIPROFLOXACIN <=0.25 SENSITIVE Sensitive     GENTAMICIN <=1 SENSITIVE Sensitive     IMIPENEM 0.5 SENSITIVE Sensitive     NITROFURANTOIN <=16 SENSITIVE Sensitive     TRIMETH/SULFA <=20 SENSITIVE Sensitive     PIP/TAZO >=128 RESISTANT Resistant     * 80,000 COLONIES/mL CITROBACTER FREUNDII         Radiology Studies: No results found.      Scheduled Meds: . apixaban  5 mg Oral BID  . atorvastatin  10 mg Oral Daily  . carvedilol  3.125 mg Oral BID WC  . diltiazem  120 mg Oral Daily  . furosemide  40 mg Intravenous Daily  . levothyroxine  125 mcg Oral Daily  . nitrofurantoin (macrocrystal-monohydrate)  100 mg Oral Q12H  . sodium chloride flush  3 mL Intravenous Q12H   Continuous Infusions: . sodium chloride       LOS: 6 days    Time spent: 30 minutes    Dorcas Carrow, MD [facility] Pager [phone]

## 2020-07-01 NOTE — Progress Notes (Signed)
    Transesophageal Echocardiogram Note  [name] [id number] [birth year]  Procedure: Transesophageal Echocardiogram Indications: Atrial fibrillation  Procedure Details Consent: Obtained Time Out: Verified patient identification, verified procedure, site/side was marked, verified correct patient position, special equipment/implants available, Radiology Safety Procedures followed,  medications/allergies/relevent history reviewed, required imaging and test results available.  Performed  Medications:  Pt sedated by anesthesia with lidocaine 20 mg and diprovan 94 mg IV total.  Normal LV function; mild AI; bileaflet MVP with moderate MR; moderate LAE; no LAA thrombus; mild to moderate TR. Pt subsequently underwent synchronized DCCV with 120J to sinus rhythm; no immediate complications; continue apixaban.   Complications: No apparent complications Patient did tolerate procedure well.  Olga Millers, MD

## 2020-07-01 NOTE — Progress Notes (Signed)
    Subjective:  Rates up with any activity Has contact dermatitis on back ? From sheets   Objective:  Vitals:   06/30/20 1200 06/30/20 1614 06/30/20 1950 07/01/20 0553  BP: (!) 86/61 106/71 101/61 122/71  Pulse: 92 71 83 72  Resp: 17 19 18 16   Temp: 97.6 F (36.4 C) (!) 97.4 F (36.3 C) 97.6 F (36.4 C) 98 F (36.7 C)  TempSrc: Oral Oral Oral Oral  SpO2: 90% 92% 98% 97%  Weight:    73.2 kg  Height:        Intake/Output from previous day:  Intake/Output Summary (Last 24 hours) at 07/01/2020 0843 Last data filed at 07/01/2020 0500 Gross per 24 hour  Intake 243 ml  Output 1200 ml  Net -957 ml    Physical Exam: Affect appropriate Healthy:  appears stated age HEENT: normal Neck supple with no adenopathy JVP normal no bruits no thyromegaly Lungs clear with no wheezing and good diaphragmatic motion Heart:  S1/S2 no murmur, no rub, gallop or click PMI normal Abdomen: benighn, BS positve, no tenderness, no AAA no bruit.  No HSM or HJR Distal pulses intact with no bruits No edema Neuro non-focal Skin  Contact dermatitis lower back  No muscular weakness   Lab Results: Basic Metabolic Panel: Recent Labs    06/29/20 0252 06/30/20 0726  NA 137 137  K 4.8 4.6  CL 96* 97*  CO2 32 32  GLUCOSE 110* 99  BUN 33* 27*  CREATININE 1.17* 1.02*  CALCIUM 9.0 9.1  MG 1.9 2.1   Liver Function Tests: No results for input(s): AST, ALT, ALKPHOS, BILITOT, PROT, ALBUMIN in the last 72 hours. No results for input(s): LIPASE, AMYLASE in the last 72 hours. CBC: Recent Labs    06/30/20 0726  WBC 7.4  HGB 13.0  HCT 39.4  MCV 97.0  PLT 288    Imaging: No results found.  Cardiac Studies:  ECG: afib non specific ST changes    Telemetry:  afib rates 80's   Echo: EF 55% moderate LAE mild MR  LA 44 mm   Medications:   . apixaban  5 mg Oral BID  . atorvastatin  10 mg Oral Daily  . carvedilol  3.125 mg Oral BID WC  . diltiazem  120 mg Oral Daily  . furosemide  40 mg  Intravenous Daily  . levothyroxine  125 mcg Oral Daily  . nitrofurantoin (macrocrystal-monohydrate)  100 mg Oral Q12H  . sodium chloride flush  3 mL Intravenous Q12H     . sodium chloride      Assessment/Plan:   1. Afib:  On eliquis , rate control  on coreg and cardizem. For TEE/DCC with Dr 08/30/20 1:00 today Procedure discussed and consent signed  She indicates some problems Swallowing large pills put no food dysphagia.   2. Diastolic CHF:  sats still a bit low but good diuresis and lungs clear continue lasix change to PO CXR 06/24/20 NAD   3. HTN:  Well controlled.  Continue current medications and low sodium Dash type diet.    4. HLD on statin   08/24/20 07/01/2020, 8:43 AM

## 2020-07-02 DIAGNOSIS — I4819 Other persistent atrial fibrillation: Secondary | ICD-10-CM | POA: Diagnosis not present

## 2020-07-02 DIAGNOSIS — I5033 Acute on chronic diastolic (congestive) heart failure: Secondary | ICD-10-CM | POA: Diagnosis not present

## 2020-07-02 LAB — BASIC METABOLIC PANEL
Anion gap: 7 (ref 5–15)
BUN: 33 mg/dL — ABNORMAL HIGH (ref 8–23)
CO2: 29 mmol/L (ref 22–32)
Calcium: 8.7 mg/dL — ABNORMAL LOW (ref 8.9–10.3)
Chloride: 98 mmol/L (ref 98–111)
Creatinine, Ser: 0.98 mg/dL (ref 0.44–1.00)
GFR, Estimated: 52 mL/min — ABNORMAL LOW (ref >60)
Glucose, Bld: 87 mg/dL (ref 70–99)
Potassium: 5 mmol/L (ref 3.5–5.1)
Sodium: 134 mmol/L — ABNORMAL LOW (ref 135–145)

## 2020-07-02 MED ORDER — IRBESARTAN 75 MG PO TABS
75.0000 mg | ORAL_TABLET | Freq: Every day | ORAL | Status: DC
  Administered 2020-07-02 – 2020-07-03 (×2): 75 mg via ORAL
  Filled 2020-07-02 (×2): qty 1

## 2020-07-02 MED ORDER — CALAMINE EX LOTN
TOPICAL_LOTION | Freq: Three times a day (TID) | CUTANEOUS | Status: DC
  Administered 2020-07-02: 1 via TOPICAL
  Filled 2020-07-02: qty 177

## 2020-07-02 MED ORDER — FUROSEMIDE 20 MG PO TABS
20.0000 mg | ORAL_TABLET | Freq: Every day | ORAL | Status: DC
  Administered 2020-07-02 – 2020-07-03 (×2): 20 mg via ORAL
  Filled 2020-07-02 (×2): qty 1

## 2020-07-02 NOTE — Progress Notes (Signed)
PROGRESS NOTE    [name]  ZOX:[id number] DOB: 1931/12/22 DOA: 06/24/2020 PCP: Everlean Cherry, MD    Brief Narrative:  Patient is a 86 year old female with history of coronary artery disease, chronic diastolic heart failure with EF 65%, grade 2 diastolic dysfunction, persistent A. fib on Eliquis, right bundle branch block, hypertension, hyperlipidemia and osteoarthritis along with chronic back pain presented to the ER with progressive lower extremity edema and worsening shortness of breath with exertion.  Chest x-ray showed cardiomegaly without focal consolidation.  Also had recent cat bite on her right lower extremity. Admitted with A. fib and diastolic heart failure exacerbation. 10/12, underwent TEE and cardioversion and maintaining sinus rhythm.   Assessment & Plan:   Principal Problem:   Acute on chronic diastolic (congestive) heart failure (HCC) Active Problems:   Essential hypertension   Hypercholesterolemia   CAD (coronary artery disease)   Hypothyroidism   Persistent atrial fibrillation (HCC)   Cat bite of right lower leg  Acute on chronic diastolic heart failure exacerbation: Treated with aggressive diuresis with improvement of symptoms.   Remains on heart failure therapy with carvedilol, irbesartan , IV Lasix changed to p.o. today.   Followed by cardiology.  Orthostatic blood pressures improving.  Rapid A. fib with underlying chronic A. fib:  Underwent TEE and DC cardioversion on 10/12 with conversion to normal sinus rhythm.   On Coreg and Cardizem.  Therapeutic on Eliquis.   Coronary artery disease: Stable without chest pain.  Hypertension: Blood pressures improved today.  Resume irbesartan.    Gross hematuria/UTI: History of recurrent UTI.  Urine culture with 80,000 Citrobacter.  Finished 3 days of nitrofurantoin.  No more hematuria.  Hypothyroidism: Increase Synthroid from 100 to 125 mcg with TSH of 8.5.  Cat bite: Local dressing.  Generalized weakness:  Physical deconditioning.  Work with PT OT.  Skilled nursing facility placement.  DVT prophylaxis:  apixaban (ELIQUIS) tablet 5 mg   Code Status: DNR Family Communication: Son on the phone 10/11, patient is talking to her son. Disposition Plan: Status is: Inpatient  Remains inpatient appropriate because:Inpatient level of care appropriate due to severity of illness   Dispo: The patient is from: Home              Anticipated d/c is to: SNF              Anticipated d/c date is: Today or when bed available.              Patient currently is medically stable to transfer to skilled level of care.         Consultants:   Cardiology  Procedures:   None  Antimicrobials:  Anti-infectives (From admission, onward)   Start     Dose/Rate Route Frequency Ordered Stop   06/29/20 1145  nitrofurantoin (macrocrystal-monohydrate) (MACROBID) capsule 100 mg        100 mg Oral Every 12 hours 06/29/20 1058 07/01/20 2256   06/27/20 1500  cefTRIAXone (ROCEPHIN) 1 g in sodium chloride 0.9 % 100 mL IVPB  Status:  Discontinued        1 g 200 mL/hr over 30 Minutes Intravenous Every 24 hours 06/27/20 1255 06/29/20 1058   06/24/20 1400  amoxicillin-clavulanate (AUGMENTIN) 875-125 MG per tablet 1 tablet  Status:  Discontinued        1 tablet Oral 2 times daily 06/24/20 1353 06/27/20 1255         Subjective: Patient seen and examined.  No overnight events.  Telemetry shows sinus rhythm.  She denies any chest pain or shortness of breath or palpitations.  Still has some itching on the back but improved with moisturizer.  Denies any shortness of breath or PND.  Objective: Vitals:   07/01/20 2046 07/02/20 0015 07/02/20 0456 07/02/20 0750  BP: (!) 130/52 (!) 118/59 130/60 119/60  Pulse: 80 70 69 68  Resp:  18 12 16   Temp: 98 F (36.7 C) (!) 97.3 F (36.3 C) 97.8 F (36.6 C) 97.8 F (36.6 C)  TempSrc: Oral Oral Oral Oral  SpO2: 98% 97% 98% 98%  Weight:   72.8 kg   Height:         Intake/Output Summary (Last 24 hours) at 07/02/2020 1113 Last data filed at 07/02/2020 0524 Gross per 24 hour  Intake 970 ml  Output 400 ml  Net 570 ml   Filed Weights   06/30/20 0553 07/01/20 0553 07/02/20 0456  Weight: 72.5 kg 73.2 kg 72.8 kg    Examination:  General exam: Appears calm and comfortable on 1 to 2 L oxygen. Respiratory system: Clear to auscultation. Respiratory effort normal.  No added sounds. Cardiovascular system: S1 & S2 heard, regular. Bilateral pedal edema 1+, right more than left. Gastrointestinal system: Abdomen is nondistended, soft and nontender. No organomegaly or masses felt. Normal bowel sounds heard. Central nervous system: Alert and oriented. No focal neurological deficits. Extremities: Symmetric 5 x 5 power. Skin: No rashes, lesions or ulcers Psychiatry: Judgement and insight appear normal. Mood & affect appropriate.     Data Reviewed: I have personally reviewed following labs and imaging studies  CBC: Recent Labs  Lab 06/26/20 0606 06/27/20 0325 06/28/20 0357 06/30/20 0726  WBC 8.1 8.4 8.3 7.4  HGB 11.5* 12.4 12.2 13.0  HCT 35.0* 36.5 36.5 39.4  MCV 96.4 98.1 96.1 97.0  PLT 282 240 294 288   Basic Metabolic Panel: Recent Labs  Lab 06/27/20 0325 06/28/20 0357 06/29/20 0252 06/30/20 0726 07/02/20 0139  NA 135 134* 137 137 134*  K 4.4 4.1 4.8 4.6 5.0  CL 97* 94* 96* 97* 98  CO2 29 30 32 32 29  GLUCOSE 99 99 110* 99 87  BUN 24* 25* 33* 27* 33*  CREATININE 1.02* 0.98 1.17* 1.02* 0.98  CALCIUM 9.0 8.8* 9.0 9.1 8.7*  MG  --   --  1.9 2.1  --    GFR: Estimated Creatinine Clearance: 44.6 mL/min (by C-G formula based on SCr of 0.98 mg/dL). Liver Function Tests: No results for input(s): AST, ALT, ALKPHOS, BILITOT, PROT, ALBUMIN in the last 168 hours. No results for input(s): LIPASE, AMYLASE in the last 168 hours. No results for input(s): AMMONIA in the last 168 hours. Coagulation Profile: No results for input(s): INR,  PROTIME in the last 168 hours. Cardiac Enzymes: No results for input(s): CKTOTAL, CKMB, CKMBINDEX, TROPONINI in the last 168 hours. BNP (last 3 results) No results for input(s): PROBNP in the last 8760 hours. HbA1C: No results for input(s): HGBA1C in the last 72 hours. CBG: No results for input(s): GLUCAP in the last 168 hours. Lipid Profile: No results for input(s): CHOL, HDL, LDLCALC, TRIG, CHOLHDL, LDLDIRECT in the last 72 hours. Thyroid Function Tests: No results for input(s): TSH, T4TOTAL, FREET4, T3FREE, THYROIDAB in the last 72 hours. Anemia Panel: No results for input(s): VITAMINB12, FOLATE, FERRITIN, TIBC, IRON, RETICCTPCT in the last 72 hours. Sepsis Labs: No results for input(s): PROCALCITON, LATICACIDVEN in the last 168 hours.  Recent Results (from the past 240 hour(s))  Respiratory Panel by RT PCR (Flu A&B, Covid) - Nasopharyngeal Swab     Status: None   Collection Time: 06/24/20  1:40 PM   Specimen: Nasopharyngeal Swab  Result Value Ref Range Status   SARS Coronavirus 2 by RT PCR NEGATIVE NEGATIVE Final    Comment: (NOTE) SARS-CoV-2 target nucleic acids are NOT DETECTED.  The SARS-CoV-2 RNA is generally detectable in upper respiratoy specimens during the acute phase of infection. The lowest concentration of SARS-CoV-2 viral copies this assay can detect is 131 copies/mL. A negative result does not preclude SARS-Cov-2 infection and should not be used as the sole basis for treatment or other patient management decisions. A negative result may occur with  improper specimen collection/handling, submission of specimen other than nasopharyngeal swab, presence of viral mutation(s) within the areas targeted by this assay, and inadequate number of viral copies (<131 copies/mL). A negative result must be combined with clinical observations, patient history, and epidemiological information. The expected result is Negative.  Fact Sheet for Patients:   https://www.moore.com/https://www.fda.gov/media/142436/download  Fact Sheet for Healthcare Providers:  https://www.young.biz/https://www.fda.gov/media/142435/download  This test is no t yet approved or cleared by the [place] FDA and  has been authorized for detection and/or diagnosis of SARS-CoV-2 by FDA under an Emergency Use Authorization (EUA). This EUA will remain  in effect (meaning this test can be used) for the duration of the COVID-19 declaration under Section 564(b)(1) of the Act, 21 U.S.C. section 360bbb-3(b)(1), unless the authorization is terminated or revoked sooner.     Influenza A by PCR NEGATIVE NEGATIVE Final   Influenza B by PCR NEGATIVE NEGATIVE Final    Comment: (NOTE) The Xpert Xpress SARS-CoV-2/FLU/RSV assay is intended as an aid in  the diagnosis of influenza from Nasopharyngeal swab specimens and  should not be used as a sole basis for treatment. Nasal washings and  aspirates are unacceptable for Xpert Xpress SARS-CoV-2/FLU/RSV  testing.  Fact Sheet for Patients: https://www.moore.com/https://www.fda.gov/media/142436/download  Fact Sheet for Healthcare Providers: https://www.young.biz/https://www.fda.gov/media/142435/download  This test is not yet approved or cleared by the [place] FDA and  has been authorized for detection and/or diagnosis of SARS-CoV-2 by  FDA under an Emergency Use Authorization (EUA). This EUA will remain  in effect (meaning this test can be used) for the duration of the  Covid-19 declaration under Section 564(b)(1) of the Act, 21  U.S.C. section 360bbb-3(b)(1), unless the authorization is  terminated or revoked. Performed at [facility] [place], [address] [place], [place] [address]   Culture, Urine     Status: Abnormal   Collection Time: 06/27/20 10:10 AM   Specimen: Urine, Clean Catch  Result Value Ref Range Status   Specimen Description URINE, CLEAN CATCH  Final   Special Requests   Final    NONE Performed at [facility], [address] [place], [place] [address]    Culture  80,000 COLONIES/mL CITROBACTER FREUNDII (A)  Final   Report Status 06/29/2020 FINAL  Final   Organism ID, Bacteria CITROBACTER FREUNDII (A)  Final      Susceptibility   Citrobacter freundii - MIC*    CEFAZOLIN >=64 RESISTANT Resistant     CEFTRIAXONE >=64 RESISTANT Resistant     CIPROFLOXACIN <=0.25 SENSITIVE Sensitive     GENTAMICIN <=1 SENSITIVE Sensitive     IMIPENEM 0.5 SENSITIVE Sensitive     NITROFURANTOIN <=16 SENSITIVE Sensitive     TRIMETH/SULFA <=20 SENSITIVE Sensitive     PIP/TAZO >=128 RESISTANT Resistant     *  80,000 COLONIES/mL CITROBACTER FREUNDII         Radiology Studies: ECHO TEE  Result Date: 07/01/2020    TRANSESOPHOGEAL ECHO REPORT   Patient Name:   [name] [facility] Date of Exam: 07/01/2020 Medical Rec #:  [id number]      Height:       70.0 in Accession #:    [id number]     Weight:       161.4 lb Date of Birth:  1932/01/21       BSA:          1.905 m Patient Age:    88 years       BP:           85/49 mmHg Patient Gender: F              HR:           66 bpm. Exam Location:  Inpatient Procedure: Transesophageal Echo Indications:     atrial fibrillation 427.31  History:         Patient has prior history of Echocardiogram examinations, most                  recent 06/25/2020. CAD; Risk Factors:Hypertension, Dyslipidemia                  and Former Smoker. Pulmonary hypertension.  Sonographer:     Celene Skeen RDCS (AE) Referring Phys:  [id number] HAO MENG Diagnosing Phys: Olga Millers MD PROCEDURE: The transesophogeal probe was passed without difficulty through the esophogus of the patient. Sedation performed by different physician. The patient developed no complications during the procedure. A direct current cardioversion was performed. IMPRESSIONS  1. Left ventricular ejection fraction, by estimation, is 55 to 60%. The left ventricle has normal function.  2. Right ventricular systolic function is normal. The right ventricular size is normal.  3. Left atrial size was moderately  dilated. No left atrial/left atrial appendage thrombus was detected.  4. The mitral valve is abnormal. Moderate mitral valve regurgitation. There is moderate prolapse of both leaflets of the mitral valve.  5. Tricuspid valve regurgitation is mild to moderate.  6. The aortic valve is tricuspid. Aortic valve regurgitation is mild. Mild aortic valve sclerosis is present, with no evidence of aortic valve stenosis.  7. There is Moderate (Grade III) plaque involving the descending aorta. FINDINGS  Left Ventricle: Left ventricular ejection fraction, by estimation, is 55 to 60%. The left ventricle has normal function. The left ventricular internal cavity size was normal in size. Right Ventricle: The right ventricular size is normal. Right vetricular wall thickness was not assessed. Right ventricular systolic function is normal. Left Atrium: Left atrial size was moderately dilated. No left atrial/left atrial appendage thrombus was detected. Right Atrium: Right atrial size was normal in size. Pericardium: There is no evidence of pericardial effusion. Mitral Valve: The mitral valve is abnormal. There is moderate prolapse of both leaflets of the mitral valve. Moderate mitral valve regurgitation. Tricuspid Valve: The tricuspid valve is normal in structure. Tricuspid valve regurgitation is mild to moderate. Aortic Valve: The aortic valve is tricuspid. Aortic valve regurgitation is mild. Mild aortic valve sclerosis is present, with no evidence of aortic valve stenosis. Pulmonic Valve: The pulmonic valve was normal in structure. Pulmonic valve regurgitation is mild. Aorta: The aortic root is normal in size and structure. There is moderate (Grade III) plaque involving the descending aorta. IAS/Shunts: No atrial level shunt detected by color flow Doppler. Olga Millers MD Electronically  signed by Olga Millers MD Signature Date/Time: 07/01/2020/1:02:59 PM    Final (Updated)    ECHO TEE  Result Date: 07/01/2020     TRANSESOPHOGEAL ECHO REPORT   Patient Name:   [name] [facility] Date of Exam: 07/01/2020 Medical Rec #:  [id number]      Height:       70.0 in Accession #:    [id number]     Weight:       161.4 lb Date of Birth:  1931-11-14       BSA:          1.905 m Patient Age:    88 years       BP:           85/49 mmHg Patient Gender: F              HR:           66 bpm. Exam Location:  Inpatient Procedure: Transesophageal Echo Indications:    atrial fibrillation 427.31  History:        Patient has prior history of Echocardiogram examinations, most                 recent 06/25/2020. CAD; Risk Factors:Hypertension, Dyslipidemia                 and Former Smoker. Pulmonary hypertension.  Sonographer:    Celene Skeen RDCS (AE) Referring Phys: [phone] HAO MENG PROCEDURE: The transesophogeal probe was passed without difficulty through the esophogus of the patient. Sedation performed by different physician. The patient developed no complications during the procedure. A direct current cardioversion was performed. IMPRESSIONS  1. Left ventricular ejection fraction, by estimation, is 55 to 60%. The left ventricle has normal function.  2. Right ventricular systolic function is normal. The right ventricular size is normal.  3. Left atrial size was moderately dilated. No left atrial/left atrial appendage thrombus was detected.  4. The mitral valve is abnormal. Moderate mitral valve regurgitation. There is moderate prolapse of both leaflets of the mitral valve.  5. Tricuspid valve regurgitation is mild to moderate.  6. The aortic valve is tricuspid. Aortic valve regurgitation is mild. Mild aortic valve sclerosis is present, with no evidence of aortic valve stenosis.  7. There is Moderate (Grade III) plaque involving the descending aorta. FINDINGS  Left Ventricle: Left ventricular ejection fraction, by estimation, is 55 to 60%. The left ventricle has normal function. The left ventricular internal cavity size was normal in size. Right Ventricle:  The right ventricular size is normal. Right vetricular wall thickness was not assessed. Right ventricular systolic function is normal. Left Atrium: Left atrial size was moderately dilated. No left atrial/left atrial appendage thrombus was detected. Right Atrium: Right atrial size was normal in size. Pericardium: There is no evidence of pericardial effusion. Mitral Valve: The mitral valve is abnormal. There is moderate prolapse of both leaflets of the mitral valve. Moderate mitral valve regurgitation. Tricuspid Valve: The tricuspid valve is normal in structure. Tricuspid valve regurgitation is mild to moderate. Aortic Valve: The aortic valve is tricuspid. Aortic valve regurgitation is mild. Mild aortic valve sclerosis is present, with no evidence of aortic valve stenosis. Pulmonic Valve: The pulmonic valve was normal in structure. Pulmonic valve regurgitation is mild. Aorta: The aortic root is normal in size and structure. There is moderate (Grade III) plaque involving the descending aorta. IAS/Shunts: No atrial level shunt detected by color flow Doppler. Olga Millers MD Electronically signed by Olga Millers MD Signature Date/Time:  07/01/2020/1:02:59 PM    Final         Scheduled Meds: . apixaban  5 mg Oral BID  . atorvastatin  10 mg Oral Daily  . carvedilol  3.125 mg Oral BID WC  . diltiazem  120 mg Oral Daily  . furosemide  20 mg Oral Daily  . levothyroxine  125 mcg Oral Daily  . sodium chloride flush  3 mL Intravenous Q12H   Continuous Infusions: . sodium chloride       LOS: 7 days    Time spent: 25 minutes    Dorcas Carrow, MD [facility] Pager [phone]

## 2020-07-02 NOTE — Progress Notes (Signed)
Occupational Therapy Treatment Patient Details Name: [name] MRN: [id number] DOB: [birth year] Today's Date: 07/02/2020    History of present illness [name] is a 86 y.o. female with medical history significant for CAD, chronic diastolic CHF (EF 63-33%, G2 DD by TTE 10/31/2019), persistent atrial fibrillation on Eliquis, RBBB, hypertension, hyperlipidemia, and osteoarthritis and chronic back pain who presents to the ED for evaluation of progressive lower extremity edema.   OT comments  Pt without symptoms of orthostatic hypotension this visit. Participated in ADL at EOB and in chair and transferred with min guard assist. Pt would like to shower, sent secure chat to Dr Jerral Ralph who authorized for next visit or with nursing. Updated d/c to SNF for ST rehab as she lives alone and needs to be modified independent. Removed 2L 02 as pt was saturating at 94-99% on RA.  Follow Up Recommendations  SNF;Supervision/Assistance - 24 hour    Equipment Recommendations  None recommended by OT    Recommendations for Other Services      Precautions / Restrictions Precautions Precautions: Fall Precaution Comments: watch BP       Mobility Bed Mobility Overal bed mobility: Modified Independent             General bed mobility comments: increased time, HOB up  Transfers Overall transfer level: Needs assistance Equipment used: Rolling walker (2 wheeled) Transfers: Sit to/from [employer] Sit to Stand: Min guard;From elevated surface Stand pivot transfers: Min guard            Balance Overall balance assessment: Needs assistance   Sitting balance-Leahy Scale: Good     Standing balance support: Bilateral upper extremity supported;During functional activity Standing balance-Leahy Scale: Poor Standing balance comment: relies on UE support                           ADL either performed or assessed with clinical judgement   ADL Overall ADL's : Needs  assistance/impaired     Grooming: Brushing hair;Wash/dry hands;Wash/dry face;Oral care;Sitting;Set up           Upper Body Dressing : Set up;Sitting       Toilet Transfer: Min guard;RW;Stand-pivot;BSC   Toileting- Clothing Manipulation and Hygiene: Min guard;Sit to/from stand               Vision       Perception     Praxis      Cognition Arousal/Alertness: Awake/alert Behavior During Therapy: WFL for tasks assessed/performed Overall Cognitive Status: Within Functional Limits for tasks assessed                                          Exercises     Shoulder Instructions       General Comments      Pertinent Vitals/ Pain       Pain Assessment: No/denies pain  Home Living                                          Prior Functioning/Environment              Frequency  Min 2X/week        Progress Toward Goals  OT Goals(current goals can now be found in the care  plan section)  Progress towards OT goals: Progressing toward goals  Acute Rehab OT Goals Patient Stated Goal: to get stronger OT Goal Formulation: With patient Time For Goal Achievement: 07/09/20 Potential to Achieve Goals: Good  Plan Discharge plan needs to be updated    Co-evaluation                 AM-PAC OT "6 Clicks" Daily Activity     Outcome Measure   Help from another person eating meals?: None Help from another person taking care of personal grooming?: A Little Help from another person toileting, which includes using toliet, bedpan, or urinal?: A Little Help from another person bathing (including washing, rinsing, drying)?: A Little Help from another person to put on and taking off regular upper body clothing?: None Help from another person to put on and taking off regular lower body clothing?: A Little 6 Click Score: 20    End of Session Equipment Utilized During Treatment: Gait belt;Rolling walker  OT Visit Diagnosis:  Muscle weakness (generalized) (M62.81);History of falling (Z91.81);Pain   Activity Tolerance Patient tolerated treatment well (Sp02 94-99% on RA)   Patient Left in chair;with call bell/phone within reach   Nurse Communication          Time: 5027-7412 OT Time Calculation (min): 30 min  Charges: OT General Charges $OT Visit: 1 Visit OT Treatments $Self Care/Home Management : 23-37 mins  Martie Round, OTR/L Acute Rehabilitation Services Pager: [phone] Office: [phone]   Evern Bio 07/02/2020, 12:48 PM

## 2020-07-02 NOTE — Progress Notes (Signed)
Subjective:  Feels well Maint NSR ? Going to inpatient rehab  Objective:  Vitals:   07/01/20 2046 07/02/20 0015 07/02/20 0456 07/02/20 0750  BP: (!) 130/52 (!) 118/59 130/60 119/60  Pulse: 80 70 69 68  Resp:  18 12 16   Temp: 98 F (36.7 C) (!) 97.3 F (36.3 C) 97.8 F (36.6 C) 97.8 F (36.6 C)  TempSrc: Oral Oral Oral Oral  SpO2: 98% 97% 98% 98%  Weight:   72.8 kg   Height:        Intake/Output from previous day:  Intake/Output Summary (Last 24 hours) at 07/02/2020 07/04/2020 Last data filed at 07/02/2020 0524 Gross per 24 hour  Intake 970 ml  Output 400 ml  Net 570 ml    Physical Exam: Affect appropriate Healthy:  appears stated age HEENT: normal Neck supple with no adenopathy JVP normal no bruits no thyromegaly Lungs clear with no wheezing and good diaphragmatic motion Heart:  S1/S2 no murmur, no rub, gallop or click PMI normal Abdomen: benighn, BS positve, no tenderness, no AAA no bruit.  No HSM or HJR Distal pulses intact with no bruits No edema Neuro non-focal Skin  Contact dermatitis lower back  No muscular weakness   Lab Results: Basic Metabolic Panel: Recent Labs    06/30/20 0726 07/02/20 0139  NA 137 134*  K 4.6 5.0  CL 97* 98  CO2 32 29  GLUCOSE 99 87  BUN 27* 33*  CREATININE 1.02* 0.98  CALCIUM 9.1 8.7*  MG 2.1  --    Liver Function Tests: No results for input(s): AST, ALT, ALKPHOS, BILITOT, PROT, ALBUMIN in the last 72 hours. No results for input(s): LIPASE, AMYLASE in the last 72 hours. CBC: Recent Labs    06/30/20 0726  WBC 7.4  HGB 13.0  HCT 39.4  MCV 97.0  PLT 288    Imaging: ECHO TEE  Result Date: 07/01/2020    TRANSESOPHOGEAL ECHO REPORT   Patient Name:   [name] [facility] Date of Exam: 07/01/2020 Medical Rec #:  08/31/2020      Height:       70.0 in Accession #:    [id number]     Weight:       161.4 lb Date of Birth:  10/27/1931       BSA:          1.905 m Patient Age:    86 years       BP:           85/49 mmHg Patient  Gender: F              HR:           66 bpm. Exam Location:  Inpatient Procedure: Transesophageal Echo Indications:     atrial fibrillation 427.31  History:         Patient has prior history of Echocardiogram examinations, most                  recent 06/25/2020. CAD; Risk Factors:Hypertension, Dyslipidemia                  and Former Smoker. Pulmonary hypertension.  Sonographer:     08/25/2020 RDCS (AE) Referring Phys:  Celene Skeen HAO MENG Diagnosing Phys: [id number] MD PROCEDURE: The transesophogeal probe was passed without difficulty through the esophogus of the patient. Sedation performed by different physician. The patient developed no complications during the procedure. A direct current cardioversion was performed. IMPRESSIONS  1. Left  ventricular ejection fraction, by estimation, is 55 to 60%. The left ventricle has normal function.  2. Right ventricular systolic function is normal. The right ventricular size is normal.  3. Left atrial size was moderately dilated. No left atrial/left atrial appendage thrombus was detected.  4. The mitral valve is abnormal. Moderate mitral valve regurgitation. There is moderate prolapse of both leaflets of the mitral valve.  5. Tricuspid valve regurgitation is mild to moderate.  6. The aortic valve is tricuspid. Aortic valve regurgitation is mild. Mild aortic valve sclerosis is present, with no evidence of aortic valve stenosis.  7. There is Moderate (Grade III) plaque involving the descending aorta. FINDINGS  Left Ventricle: Left ventricular ejection fraction, by estimation, is 55 to 60%. The left ventricle has normal function. The left ventricular internal cavity size was normal in size. Right Ventricle: The right ventricular size is normal. Right vetricular wall thickness was not assessed. Right ventricular systolic function is normal. Left Atrium: Left atrial size was moderately dilated. No left atrial/left atrial appendage thrombus was detected. Right Atrium: Right  atrial size was normal in size. Pericardium: There is no evidence of pericardial effusion. Mitral Valve: The mitral valve is abnormal. There is moderate prolapse of both leaflets of the mitral valve. Moderate mitral valve regurgitation. Tricuspid Valve: The tricuspid valve is normal in structure. Tricuspid valve regurgitation is mild to moderate. Aortic Valve: The aortic valve is tricuspid. Aortic valve regurgitation is mild. Mild aortic valve sclerosis is present, with no evidence of aortic valve stenosis. Pulmonic Valve: The pulmonic valve was normal in structure. Pulmonic valve regurgitation is mild. Aorta: The aortic root is normal in size and structure. There is moderate (Grade III) plaque involving the descending aorta. IAS/Shunts: No atrial level shunt detected by color flow Doppler. Olga Millers MD Electronically signed by Olga Millers MD Signature Date/Time: 07/01/2020/1:02:59 PM    Final (Updated)    ECHO TEE  Result Date: 07/01/2020    TRANSESOPHOGEAL ECHO REPORT   Patient Name:   [name] [facility] Date of Exam: 07/01/2020 Medical Rec #:  [id number]      Height:       70.0 in Accession #:    [id number]     Weight:       161.4 lb Date of Birth:  Oct 26, 1931       BSA:          1.905 m Patient Age:    86 years       BP:           85/49 mmHg Patient Gender: F              HR:           66 bpm. Exam Location:  Inpatient Procedure: Transesophageal Echo Indications:    atrial fibrillation 427.31  History:        Patient has prior history of Echocardiogram examinations, most                 recent 06/25/2020. CAD; Risk Factors:Hypertension, Dyslipidemia                 and Former Smoker. Pulmonary hypertension.  Sonographer:    Celene Skeen RDCS (AE) Referring Phys: [phone] HAO MENG PROCEDURE: The transesophogeal probe was passed without difficulty through the esophogus of the patient. Sedation performed by different physician. The patient developed no complications during the procedure. A direct current  cardioversion was performed. IMPRESSIONS  1. Left ventricular ejection fraction, by estimation, is 55  to 60%. The left ventricle has normal function.  2. Right ventricular systolic function is normal. The right ventricular size is normal.  3. Left atrial size was moderately dilated. No left atrial/left atrial appendage thrombus was detected.  4. The mitral valve is abnormal. Moderate mitral valve regurgitation. There is moderate prolapse of both leaflets of the mitral valve.  5. Tricuspid valve regurgitation is mild to moderate.  6. The aortic valve is tricuspid. Aortic valve regurgitation is mild. Mild aortic valve sclerosis is present, with no evidence of aortic valve stenosis.  7. There is Moderate (Grade III) plaque involving the descending aorta. FINDINGS  Left Ventricle: Left ventricular ejection fraction, by estimation, is 55 to 60%. The left ventricle has normal function. The left ventricular internal cavity size was normal in size. Right Ventricle: The right ventricular size is normal. Right vetricular wall thickness was not assessed. Right ventricular systolic function is normal. Left Atrium: Left atrial size was moderately dilated. No left atrial/left atrial appendage thrombus was detected. Right Atrium: Right atrial size was normal in size. Pericardium: There is no evidence of pericardial effusion. Mitral Valve: The mitral valve is abnormal. There is moderate prolapse of both leaflets of the mitral valve. Moderate mitral valve regurgitation. Tricuspid Valve: The tricuspid valve is normal in structure. Tricuspid valve regurgitation is mild to moderate. Aortic Valve: The aortic valve is tricuspid. Aortic valve regurgitation is mild. Mild aortic valve sclerosis is present, with no evidence of aortic valve stenosis. Pulmonic Valve: The pulmonic valve was normal in structure. Pulmonic valve regurgitation is mild. Aorta: The aortic root is normal in size and structure. There is moderate (Grade III) plaque  involving the descending aorta. IAS/Shunts: No atrial level shunt detected by color flow Doppler. Olga Millers MD Electronically signed by Olga Millers MD Signature Date/Time: 07/01/2020/1:02:59 PM    Final     Cardiac Studies:  ECG: afib non specific ST changes    Telemetry:  NSR rates 70   Echo: EF 55% moderate LAE mild MR  LA 44 mm   Medications:   . apixaban  5 mg Oral BID  . atorvastatin  10 mg Oral Daily  . carvedilol  3.125 mg Oral BID WC  . diltiazem  120 mg Oral Daily  . furosemide  40 mg Intravenous Daily  . levothyroxine  125 mcg Oral Daily  . sodium chloride flush  3 mL Intravenous Q12H     . sodium chloride      Assessment/Plan:   1. Afib:  On eliquis , post TEE/DCC 07/01/20 maint NSR continue coreg , cardizem and eliquis  2. Diastolic CHF:  Change to PO lasix follow K    3. HTN:  Well controlled.  Continue current medications and low sodium Dash type diet.    4. HLD on statin   Charlton Haws 07/02/2020, 8:11 AM

## 2020-07-02 NOTE — NC FL2 (Signed)
[place] MEDICAID FL2 LEVEL OF CARE SCREENING TOOL     IDENTIFICATION  Patient Name: [name] Birthdate: 10-17-1931 Sex: female Admission Date (Current Location): 06/24/2020  [facility] and [place] Number:  Producer, television/film/video and Address:  [facility], [address] [place], [place] [address]      Provider Number: [id number]  Attending Physician Name and Address:  Dorcas Carrow, MD  Relative Name and Phone Number:  Necha Harries [phone]    Current Level of Care: Hospital Recommended Level of Care: Skilled Nursing Facility Prior Approval Number:    Date Approved/Denied:   PASRR Number: [id number]  Discharge Plan: SNF    Current Diagnoses: Patient Active Problem List   Diagnosis Date Noted  . Acute on chronic diastolic (congestive) heart failure (HCC) 06/24/2020  . Cat bite of right lower leg 06/24/2020  . Persistent atrial fibrillation (HCC) 06/18/2020  . Moderate tricuspid regurgitation 06/18/2020  . Hypothyroidism   . CAD (coronary artery disease) 01/02/2020  . Abnormal nuclear stress test 11/15/2019  . Essential hypertension 09/10/2019  . Mitral regurgitation 09/10/2019  . Palpitations 09/10/2019  . Dyspnea on exertion 09/10/2019  . Chest tightness 09/10/2019  . Osteoporosis 08/24/2018  . Fall at home 07/17/2018  . PVC (premature ventricular contraction) 04/17/2018  . Pulmonary hypertension (HCC) 12/08/2017  . Former smoker 10/20/2017  . RBBB 10/20/2017  . Drug therapy 12/11/2015  . Idiopathic chronic gout of multiple sites without tophus 12/11/2015  . Idiopathic peripheral neuropathy 12/11/2015  . Recurrent UTI 12/11/2015  . Spinal stenosis of lumbar region 12/11/2015  . Acute cystitis without hematuria 11/15/2014  . Gross hematuria 10/08/2014  . Abnormal perfusion scintigraphy 07/22/2014  . Abnormal results of function studies of other organs and systems 07/22/2014  . B12 deficiency 07/22/2014  . Benign hypertensive  heart and kidney disease with chronic kidney disease 07/22/2014  . Chronic kidney disease, stage 2 (mild) 07/22/2014  . Degeneration, intervertebral disc, cervical 07/22/2014  . Depression, major, single episode, moderate (HCC) 07/22/2014  . Drug rash 07/22/2014  . Fatigue 07/22/2014  . Anxiety disorder 07/22/2014  . Hypercholesterolemia 07/22/2014  . Hypertonicity of bladder 07/22/2014  . Gout 07/22/2014  . Hypothyroidism (acquired) 07/22/2014  . Increased frequency of urination 07/22/2014  . Left atrial enlargement 07/22/2014  . Lethargy 07/22/2014  . Low back pain 07/22/2014  . Obesity 07/22/2014  . Onychomycosis due to dermatophyte 07/22/2014  . Osteoarthritis, generalized 07/22/2014  . Osteopenia 07/22/2014  . Sciatica 07/22/2014  . Venous stasis dermatitis of both lower extremities 07/22/2014  . Venous insufficiency 07/22/2014  . Varicose veins 07/22/2014  . Urge incontinence of urine 07/22/2014  . Unspecified abnormalities of gait and mobility 07/22/2014  . TMJ arthralgia 07/22/2014  . Vitamin D deficiency 07/22/2014  . Urinary tract infection, site not specified 04/04/2014  . HYPOTHYROIDISM, POST-RADIATION 06/11/2009  . URINARY INCONTINENCE 06/11/2009    Orientation RESPIRATION BLADDER Height & Weight     Self, Time, Situation, Place  Normal Incontinent, External catheter Weight: 160 lb 8 oz (72.8 kg) Height:  5\' 10"  (177.8 cm)  BEHAVIORAL SYMPTOMS/MOOD NEUROLOGICAL BOWEL NUTRITION STATUS      Continent Diet (see dc summary)  AMBULATORY STATUS COMMUNICATION OF NEEDS Skin   Limited Assist Verbally Normal                       Personal Care Assistance Level of Assistance  Bathing, Feeding, Dressing Bathing Assistance: Limited assistance Feeding assistance: Independent Dressing Assistance: Limited  assistance     Functional Limitations Info  Sight, Hearing, Speech Sight Info: Adequate Hearing Info: Adequate Speech Info: Adequate    SPECIAL CARE FACTORS  FREQUENCY  PT (By licensed PT), OT (By licensed OT)     PT Frequency: 5x week OT Frequency: 5x week            Contractures Contractures Info: Present    Additional Factors Info  Code Status, Allergies Code Status Info: DNR Allergies Info: Isosorbide Nitrate Cephalexin Gentamicin Penicillins Sulfa Antibiotics           Current Medications (07/02/2020):  This is the current hospital active medication list Current Facility-Administered Medications  Medication Dose Route Frequency Provider Last Rate Last Admin  . 0.9 %  sodium chloride infusion  250 mL Intravenous PRN Charlsie Quest, MD   New Bag at 07/01/20 1256  . acetaminophen (TYLENOL) tablet 650 mg  650 mg Oral Q4H PRN Charlsie Quest, MD      . apixaban (ELIQUIS) tablet 5 mg  5 mg Oral BID Renda Rolls Z, DO   5 mg at 07/02/20 0916  . atorvastatin (LIPITOR) tablet 10 mg  10 mg Oral Daily Charlsie Quest, MD   10 mg at 07/02/20 0916  . carvedilol (COREG) tablet 3.125 mg  3.125 mg Oral BID WC Albertine Grates, MD   3.125 mg at 07/02/20 0916  . diclofenac Sodium (VOLTAREN) 1 % topical gel 2 g  2 g Topical TID PRN Marinda Elk, MD   2 g at 06/28/20 0603  . diltiazem (CARDIZEM CD) 24 hr capsule 120 mg  120 mg Oral Daily Albertine Grates, MD   120 mg at 07/02/20 0916  . diphenhydrAMINE (BENADRYL) capsule 25 mg  25 mg Oral Q6H PRN Marinda Elk, MD   25 mg at 06/27/20 0123  . furosemide (LASIX) tablet 20 mg  20 mg Oral Daily Wendall Stade, MD   20 mg at 07/02/20 0916  . hydrocortisone 1 % ointment   Topical PRN Thalia Party, DO   Given at 06/27/20 0123  . irbesartan (AVAPRO) tablet 75 mg  75 mg Oral Daily Ghimire, Lyndel Safe, MD      . levothyroxine (SYNTHROID) tablet 125 mcg  125 mcg Oral Daily Albertine Grates, MD   125 mcg at 07/02/20 0521  . ondansetron (ZOFRAN) injection 4 mg  4 mg Intravenous Q6H PRN Darreld Mclean R, MD      . sodium chloride flush (NS) 0.9 % injection 3 mL  3 mL Intravenous Q12H Charlsie Quest, MD   3 mL at 07/02/20  0918  . sodium chloride flush (NS) 0.9 % injection 3 mL  3 mL Intravenous PRN Charlsie Quest, MD         Discharge Medications: Please see discharge summary for a list of discharge medications.  Relevant Imaging Results:  Relevant Lab Results:   Additional Information SSN [id number]  Carmina Miller, LCSWA

## 2020-07-03 ENCOUNTER — Encounter (HOSPITAL_COMMUNITY): Payer: Self-pay | Admitting: Cardiology

## 2020-07-03 DIAGNOSIS — I5033 Acute on chronic diastolic (congestive) heart failure: Secondary | ICD-10-CM | POA: Diagnosis not present

## 2020-07-03 DIAGNOSIS — I480 Paroxysmal atrial fibrillation: Secondary | ICD-10-CM

## 2020-07-03 LAB — SARS CORONAVIRUS 2 BY RT PCR (HOSPITAL ORDER, PERFORMED IN ~~LOC~~ HOSPITAL LAB): SARS Coronavirus 2: NEGATIVE

## 2020-07-03 MED ORDER — CARVEDILOL 3.125 MG PO TABS
3.1250 mg | ORAL_TABLET | Freq: Two times a day (BID) | ORAL | 0 refills | Status: DC
Start: 2020-07-03 — End: 2020-07-14

## 2020-07-03 MED ORDER — LEVOTHYROXINE SODIUM 125 MCG PO TABS: 125.0000 ug | ORAL_TABLET | Freq: Every day | ORAL | 0 refills | Status: DC

## 2020-07-03 MED ORDER — DILTIAZEM HCL ER COATED BEADS 120 MG PO CP24: 120.0000 mg | ORAL_CAPSULE | Freq: Every day | ORAL | 0 refills | Status: DC

## 2020-07-03 MED ORDER — DICLOFENAC SODIUM 1 % EX GEL: 2.0000 g | Freq: Three times a day (TID) | CUTANEOUS | 0 refills | Status: AC | PRN

## 2020-07-03 NOTE — Progress Notes (Signed)
D/C instructions printed and placed in packet at nurse's station. Tele and IV removed, tolerated well.  

## 2020-07-03 NOTE — Discharge Summary (Signed)
[name]:[id number] DOB: 09-Nov-1931 DOA: 06/24/2020  PCP: Everlean Cherry, MD  Admit date: 06/24/2020  Discharge date: 07/03/2020  Admitted From: home   Disposition:  [facility]   Recommendations for Outpatient Follow-up:   Follow up with PCP in 1-2 weeks Follow up with CHF Clinic Follow up with Cardiology Clinic  Home Health: NA   Equipment/Devices: NA  Consultations: Cardiology Discharge Condition: Improved   CODE STATUS: DNR   Diet Recommendation: Heart Healthy Low Sodium   Diet Order            Diet - low sodium heart healthy           Diet Heart Room service appropriate? Yes; Fluid consistency: Thin  Diet effective now                  No chief complaint on file.    Brief history of present illness from the day of admission and additional interim summary    Patient is a 86 year old female with history of coronary artery disease, chronic diastolic heart failure with EF 65%, grade 2 diastolic dysfunction, persistent A. fib on Eliquis, right bundle branch block, hypertension, hyperlipidemia and osteoarthritis along with chronic back pain presented to the ER with progressive lower extremity edema and worsening shortness of breath with exertion.  Chest x-ray showed cardiomegaly without focal consolidation.  Also had recent cat bite on her right lower extremity.                                                                  Hospital Course   Patient was admitted for decompensated HFpEF worsened by persistent A. Fib I was manifesting primarily as significant lower extremity edema with weeping.  Patient was treated with IV Lasix with excellent diuresis.  She was also given Augmentin for possible infected cat bite.  Diuresis was complicated by some orthostatic hypotension and relatively lower  blood pressures.  Her ARB dosage was decreased as was her carvedilol.  However decrease in carvedilol resulted in elevated heart rate.  Patient was being followed by cardiology who recommended cardioversion.  She had been therapeutic on Eliquis.  Patient underwent cardioversion on 07/01/2020 with excellent result and patient has maintained in normal sinus rhythm with resumption of normal blood pressure.  Patient has continued to have an excellent diuresis with marked decrease in her lower extremity edema.  Patient is now discharged to rehab on maintenance medications and in sinus rhythm.   HFpEF Status post excellent diuresis with IV Lasix Resume home doses of Lasix 20 mg p.o. Low-sodium diet Continue diltiazem, carvedilol and ARB (patient was on irbesartan at home but she can go back to her telmisartan per home doses)  Atrial fibrillation Status post DC cardioversion 07/01/2020, presently in NSR Continue diltiazem, carvedilol  and Eliquis Follow-up with cardiology in 2 to 4 weeks.  HTN Blood pressure is now normalized back on Cardizem, carvedilol and a irbesartan since her cardioversion.  Lower extremity edema, status post cat bite Patient's lower extremity edema is secondary to both venous stasis disease as well as decompensated heart failure She has responded well to IV diuresis. Continues to have somewhat tender edema bilaterally, will need to continue compression hose Cat bite was treated with Augmentin, no evidence of ongoing infection Continue local dressing  Gross hematuria Resolved after treatment with nitrofurantoin x3 days Urine culture grew out 80,000 CFU of Citrobacter  Hypothyroidism  Patient's TSH was noted to be 8.5 and Synthroid was increased to 125 mcg at home however will discharge patient home on 100 mcg given recent cardioversion.  Patient was asymptomatic from subclinical hypothyroidism. Given advanced age, will be conservative in management, if warranted Synthroid  dose can be increased by her PCP as an outpatient.  CAD Continue beta-blocker, ARB as noted above Platelet agents per cardiology Patient was asymptomatic without any chest discomfort during her hospital stay  Generalized weakness and deconditioning Patient being discharged to rehab for ongoing PT OT    Discharge diagnosis     Principal Problem:   Acute on chronic diastolic (congestive) heart failure (HCC) Active Problems:   Essential hypertension   Hypercholesterolemia   CAD (coronary artery disease)   Hypothyroidism   Persistent atrial fibrillation (HCC)   Cat bite of right lower leg   PAF (paroxysmal atrial fibrillation) (HCC)    Discharge instructions    Discharge Instructions    (HEART FAILURE PATIENTS) Call MD:  Anytime you have any of the following symptoms: 1) 3 pound weight gain in 24 hours or 5 pounds in 1 week 2) shortness of breath, with or without a dry hacking cough 3) swelling in the hands, feet or stomach 4) if you have to sleep on extra pillows at night in order to breathe.   Complete by: As directed    AMB referral to CHF clinic   Complete by: As directed    Call MD for:  difficulty breathing, headache or visual disturbances   Complete by: As directed    Call MD for:  extreme fatigue   Complete by: As directed    Call MD for:  persistant dizziness or light-headedness   Complete by: As directed    Diet - low sodium heart healthy   Complete by: As directed    Discharge instructions   Complete by: As directed    1. Some of your medication dosages have changed (Carvedilol, Diltiazem). Make sure you are taking the new dosage, not the old ones, when you get home.  2. I am referring you to Congestive Heart Failure (CHF) Clinic. They will help you manage the swelling of your legs after you get home.  3. Continue wearing the compression stockings at home.   Increase activity slowly   Complete by: As directed       Discharge Medications   Allergies as of  07/03/2020      Reactions   Isosorbide Nitrate Other (See Comments)   headache   Cephalexin Rash   Gentamicin Dermatitis   Penicillins Rash   Sulfa Antibiotics Palpitations      Medication List    TAKE these medications   acetaminophen 500 MG tablet Commonly known as: TYLENOL Take 1,000 mg by mouth every 6 (six) hours as needed for mild pain.   allopurinol 100 MG tablet Commonly known  as: ZYLOPRIM Take 100 mg by mouth daily.   atorvastatin 10 MG tablet Commonly known as: LIPITOR Take 1 tablet (10 mg total) by mouth daily.   carvedilol 3.125 MG tablet Commonly known as: COREG Take 1 tablet (3.125 mg total) by mouth 2 (two) times daily with a meal. What changed:   medication strength  how much to take   cholecalciferol 1000 units tablet Commonly known as: VITAMIN D Take 1,000 Units by mouth daily.   colchicine 0.6 MG tablet Take 0.6 mg by mouth daily.   diclofenac Sodium 1 % Gel Commonly known as: VOLTAREN Apply 2 g topically 3 (three) times daily as needed for up to 10 days (pain).   diltiazem 120 MG 24 hr capsule Commonly known as: CARDIZEM CD Take 1 capsule (120 mg total) by mouth daily. Start taking on: July 04, 2020 What changed:   medication strength  how much to take   Eliquis 5 MG Tabs tablet Generic drug: apixaban Take 5 mg by mouth 2 (two) times daily.   furosemide 20 MG tablet Commonly known as: Lasix Take 1 tablet (20 mg total) by mouth daily.   levothyroxine 100 MCG tablet Commonly known as: SYNTHROID Take 100 mcg by mouth daily.   telmisartan 20 MG tablet Commonly known as: MICARDIS Take 20 mg by mouth daily.   vitamin B-12 1000 MCG tablet Commonly known as: CYANOCOBALAMIN Take 1,000 mcg by mouth daily.            Durable Medical Equipment  (From admission, onward)         Start     Ordered   06/27/20 1652  For home use only DME 4 wheeled rolling walker with seat  Once       Question:  Patient needs a walker to  treat with the following condition  Answer:  Weakness   06/27/20 1652           Contact information for follow-up providers    [facility] Follow up.   Why: rollator Contact information: [address] [place] [address] [phone]        [facility] Follow up.   Specialty: Home Health Services Why: HHRN,HHPT, HHOT Contact information: [address] [place] [address] [phone]        Thomasene Ripple, DO. Schedule an appointment as soon as possible for a visit in 1 month(s).   Specialty: Cardiology Contact information: [address] [place] [address] [phone]            Contact information for after-discharge care    Destination    [facility] Preferred SNF .   Service: Skilled Nursing Contact information: [address] [place] [address] [phone]                  Major procedures and Radiology Reports - PLEASE review detailed and final reports thoroughly  -       DG Chest 2 View  Result Date: 06/24/2020 CLINICAL DATA:  Leg swelling EXAM: CHEST - 2 VIEW COMPARISON:  June 11, 2020 FINDINGS: The cardiomediastinal silhouette is unchanged and enlarged in contour. No pleural effusion. No pneumothorax. Unchanged linear opacity in the RIGHT lung base, likely atelectasis/scar. Visualized abdomen is unremarkable. Multilevel degenerative changes of the thoracic spine. IMPRESSION: No acute cardiopulmonary abnormality. Electronically Signed   By: Meda Klinefelter MD   On: 06/24/2020 13:17   ECHOCARDIOGRAM COMPLETE  Result  Date: 06/25/2020    ECHOCARDIOGRAM REPORT   Patient Name:   HENNA DERDERIAN [facility] Date of Exam: 06/25/2020 Medical Rec #:  [id number]      Height:       70.0 in Accession #:    [id number]     Weight:       165.9 lb Date of Birth:  08-Aug-1932       BSA:          1.928 m Patient Age:    86 years       BP:           104/57 mmHg Patient Gender:  F              HR:           78 bpm. Exam Location:  Inpatient Procedure: 2D Echo, Cardiac Doppler and Color Doppler Indications:    I50.33 Acute on chronic diastolic (congestive) heart failure  History:        Patient has prior history of Echocardiogram examinations, most                 recent 10/31/2019. CAD, Pulmonary HTN, Arrythmias:RBBB; Risk                 Factors:Hypertension, Dyslipidemia and Former Smoker.                 Hypothyroidism.  Sonographer:    Elmarie Shiley Dance Referring Phys: [id number] VISHAL R PATEL IMPRESSIONS  1. Inferior basal hypokinesis. Left ventricular ejection fraction, by estimation, is 55%. The left ventricle has normal function. The left ventricle has no regional wall motion abnormalities. Left ventricular diastolic parameters are indeterminate.  2. Right ventricular systolic function is normal. The right ventricular size is normal. There is normal pulmonary artery systolic pressure.  3. Left atrial size was moderately dilated.  4. Right atrial size was mildly dilated.  5. The mitral valve is normal in structure. Mild mitral valve regurgitation. No evidence of mitral stenosis.  6. The aortic valve is normal in structure. Aortic valve regurgitation is not visualized. No aortic stenosis is present.  7. The inferior vena cava is dilated in size with >50% respiratory variability, suggesting right atrial pressure of 8 mmHg. FINDINGS  Left Ventricle: Inferior basal hypokinesis. Left ventricular ejection fraction, by estimation, is 55%. The left ventricle has normal function. The left ventricle has no regional wall motion abnormalities. The left ventricular internal cavity size was normal in size. There is no left ventricular hypertrophy. Left ventricular diastolic parameters are indeterminate. Right Ventricle: The right ventricular size is normal. No increase in right ventricular wall thickness. Right ventricular systolic function is normal. There is normal pulmonary artery systolic pressure.  The tricuspid regurgitant velocity is 2.58 m/s, and  with an assumed right atrial pressure of 8 mmHg, the estimated right ventricular systolic pressure is 34.6 mmHg. Left Atrium: Left atrial size was moderately dilated. Right Atrium: Right atrial size was mildly dilated. Pericardium: There is no evidence of pericardial effusion. Mitral Valve: The mitral valve is normal in structure. Mild mitral valve regurgitation. No evidence of mitral valve stenosis. Tricuspid Valve: The tricuspid valve is normal in structure. Tricuspid valve regurgitation is mild . No evidence of tricuspid stenosis. Aortic Valve: The aortic valve is normal in structure. Aortic valve regurgitation is not visualized. No aortic stenosis is present. Pulmonic Valve: The pulmonic valve was normal in structure. Pulmonic valve regurgitation is not visualized. No evidence of pulmonic stenosis. Aorta: The aortic root is normal  in size and structure. Venous: The inferior vena cava is dilated in size with greater than 50% respiratory variability, suggesting right atrial pressure of 8 mmHg. IAS/Shunts: No atrial level shunt detected by color flow Doppler.  LEFT VENTRICLE PLAX 2D LVIDd:         4.30 cm LVIDs:         3.61 cm LV PW:         1.13 cm LV IVS:        0.79 cm LVOT diam:     1.80 cm LV SV:         45 LV SV Index:   23 LVOT Area:     2.54 cm  RIGHT VENTRICLE          IVC RV Basal diam:  2.77 cm  IVC diam: 2.57 cm TAPSE (M-mode): 1.6 cm LEFT ATRIUM             Index       RIGHT ATRIUM           Index LA diam:        4.40 cm 2.28 cm/m  RA Area:     21.40 cm LA Vol (A2C):   74.5 ml 38.64 ml/m RA Volume:   72.00 ml  37.35 ml/m LA Vol (A4C):   59.2 ml 30.71 ml/m LA Biplane Vol: 67.3 ml 34.91 ml/m  AORTIC VALVE LVOT Vmax:   96.35 cm/s LVOT Vmean:  62.650 cm/s LVOT VTI:    0.176 m  AORTA Ao Root diam: 2.90 cm Ao Asc diam:  2.90 cm MITRAL VALVE                TRICUSPID VALVE MV Area (PHT): 3.05 cm     TR Peak grad:   26.6 mmHg MV Decel Time: 249 msec      TR Vmax:        258.00 cm/s MV E velocity: 150.00 cm/s                             SHUNTS                             Systemic VTI:  0.18 m                             Systemic Diam: 1.80 cm Charlton HawsPeter Nishan MD Electronically signed by Charlton HawsPeter Nishan MD Signature Date/Time: 06/25/2020/9:52:34 AM    Final    ECHO TEE  Result Date: 07/01/2020    TRANSESOPHOGEAL ECHO REPORT   [facility] Exam: 07/01/2020 Medical Rec #:  [id number]      Height:       70.0 in Accession #:    [id number]     Weight:       161.4 lb Date of Birth:  06/01/1932       BSA:          1.905 m Patient Age:    86 years       BP:           85/49 mmHg Patient Gender: F              HR:           66 bpm. Exam Location:  Inpatient Procedure: Transesophageal Echo Indications:     atrial  fibrillation 427.31  History:         Patient has prior history of Echocardiogram examinations, most                  recent 06/25/2020. CAD; Risk Factors:Hypertension, Dyslipidemia                  and Former Smoker. Pulmonary hypertension.  Sonographer:     Celene Skeen RDCS (AE) Referring Phys:  [id number] HAO MENG Diagnosing Phys: Olga Millers MD PROCEDURE: The transesophogeal probe was passed without difficulty through the esophogus of the patient. Sedation performed by different physician. The patient developed no complications during the procedure. A direct current cardioversion was performed. IMPRESSIONS  1. Left ventricular ejection fraction, by estimation, is 55 to 60%. The left ventricle has normal function.  2. Right ventricular systolic function is normal. The right ventricular size is normal.  3. Left atrial size was moderately dilated. No left atrial/left atrial appendage thrombus was detected.  4. The mitral valve is abnormal. Moderate mitral valve regurgitation. There is moderate prolapse of both leaflets of the mitral valve.  5. Tricuspid valve regurgitation is mild to moderate.  6. The aortic valve is tricuspid. Aortic valve  regurgitation is mild. Mild aortic valve sclerosis is present, with no evidence of aortic valve stenosis.  7. There is Moderate (Grade III) plaque involving the descending aorta. FINDINGS  Left Ventricle: Left ventricular ejection fraction, by estimation, is 55 to 60%. The left ventricle has normal function. The left ventricular internal cavity size was normal in size. Right Ventricle: The right ventricular size is normal. Right vetricular wall thickness was not assessed. Right ventricular systolic function is normal. Left Atrium: Left atrial size was moderately dilated. No left atrial/left atrial appendage thrombus was detected. Right Atrium: Right atrial size was normal in size. Pericardium: There is no evidence of pericardial effusion. Mitral Valve: The mitral valve is abnormal. There is moderate prolapse of both leaflets of the mitral valve. Moderate mitral valve regurgitation. Tricuspid Valve: The tricuspid valve is normal in structure. Tricuspid valve regurgitation is mild to moderate. Aortic Valve: The aortic valve is tricuspid. Aortic valve regurgitation is mild. Mild aortic valve sclerosis is present, with no evidence of aortic valve stenosis. Pulmonic Valve: The pulmonic valve was normal in structure. Pulmonic valve regurgitation is mild. Aorta: The aortic root is normal in size and structure. There is moderate (Grade III) plaque involving the descending aorta. IAS/Shunts: No atrial level shunt detected by color flow Doppler. Olga Millers MD Electronically signed by Olga Millers MD Signature Date/Time: 07/01/2020/1:02:59 PM    Final (Updated)    ECHO TEE  Result Date: 07/01/2020    TRANSESOPHOGEAL ECHO REPORT   Patient Name:   RUSSIA SCHEIDERER [facility] Date of Exam: 07/01/2020 Medical Rec #:  [id number]      Height:       70.0 in Accession #:    [id number]     Weight:       161.4 lb Date of Birth:  [birth year]       BSA:          1.905 m Patient Age:    86 years       BP:           85/49 mmHg Patient Gender: F               HR:           66 bpm. Exam Location:  Inpatient Procedure: Transesophageal Echo Indications:  atrial fibrillation 427.31  History:        Patient has prior history of Echocardiogram examinations, most                 recent 06/25/2020. CAD; Risk Factors:Hypertension, Dyslipidemia                 and Former Smoker. Pulmonary hypertension.  Sonographer:    Celene Skeen RDCS (AE) Referring Phys: [phone] HAO MENG PROCEDURE: The transesophogeal probe was passed without difficulty through the esophogus of the patient. Sedation performed by different physician. The patient developed no complications during the procedure. A direct current cardioversion was performed. IMPRESSIONS  1. Left ventricular ejection fraction, by estimation, is 55 to 60%. The left ventricle has normal function.  2. Right ventricular systolic function is normal. The right ventricular size is normal.  3. Left atrial size was moderately dilated. No left atrial/left atrial appendage thrombus was detected.  4. The mitral valve is abnormal. Moderate mitral valve regurgitation. There is moderate prolapse of both leaflets of the mitral valve.  5. Tricuspid valve regurgitation is mild to moderate.  6. The aortic valve is tricuspid. Aortic valve regurgitation is mild. Mild aortic valve sclerosis is present, with no evidence of aortic valve stenosis.  7. There is Moderate (Grade III) plaque involving the descending aorta. FINDINGS  Left Ventricle: Left ventricular ejection fraction, by estimation, is 55 to 60%. The left ventricle has normal function. The left ventricular internal cavity size was normal in size. Right Ventricle: The right ventricular size is normal. Right vetricular wall thickness was not assessed. Right ventricular systolic function is normal. Left Atrium: Left atrial size was moderately dilated. No left atrial/left atrial appendage thrombus was detected. Right Atrium: Right atrial size was normal in size. Pericardium: There is  no evidence of pericardial effusion. Mitral Valve: The mitral valve is abnormal. There is moderate prolapse of both leaflets of the mitral valve. Moderate mitral valve regurgitation. Tricuspid Valve: The tricuspid valve is normal in structure. Tricuspid valve regurgitation is mild to moderate. Aortic Valve: The aortic valve is tricuspid. Aortic valve regurgitation is mild. Mild aortic valve sclerosis is present, with no evidence of aortic valve stenosis. Pulmonic Valve: The pulmonic valve was normal in structure. Pulmonic valve regurgitation is mild. Aorta: The aortic root is normal in size and structure. There is moderate (Grade III) plaque involving the descending aorta. IAS/Shunts: No atrial level shunt detected by color flow Doppler. Olga Millers MD Electronically signed by Olga Millers MD Signature Date/Time: 07/01/2020/1:02:59 PM    Final     Micro Results    Recent Results (from the past 240 hour(s))  Respiratory Panel by RT PCR (Flu A&B, Covid) - Nasopharyngeal Swab     Status: None   Collection Time: 06/24/20  1:40 PM   Specimen: Nasopharyngeal Swab  Result Value Ref Range Status   SARS Coronavirus 2 by RT PCR NEGATIVE NEGATIVE Final    Comment: (NOTE) SARS-CoV-2 target nucleic acids are NOT DETECTED.  The SARS-CoV-2 RNA is generally detectable in upper respiratoy specimens during the acute phase of infection. The lowest concentration of SARS-CoV-2 viral copies this assay can detect is 131 copies/mL. A negative result does not preclude SARS-Cov-2 infection and should not be used as the sole basis for treatment or other patient management decisions. A negative result may occur with  improper specimen collection/handling, submission of specimen other than nasopharyngeal swab, presence of viral mutation(s) within the areas targeted by this assay, and inadequate number of viral  copies (<131 copies/mL). A negative result must be combined with clinical observations, patient history,  and epidemiological information. The expected result is Negative.  Fact Sheet for Patients:  https://www.moore.com/  Fact Sheet for Healthcare Providers:  https://www.young.biz/  This test is no t yet approved or cleared by the [place] FDA and  has been authorized for detection and/or diagnosis of SARS-CoV-2 by FDA under an Emergency Use Authorization (EUA). This EUA will remain  in effect (meaning this test can be used) for the duration of the COVID-19 declaration under Section 564(b)(1) of the Act, 21 U.S.C. section 360bbb-3(b)(1), unless the authorization is terminated or revoked sooner.     Influenza A by PCR NEGATIVE NEGATIVE Final   Influenza B by PCR NEGATIVE NEGATIVE Final    Comment: (NOTE) The Xpert Xpress SARS-CoV-2/FLU/RSV assay is intended as an aid in  the diagnosis of influenza from Nasopharyngeal swab specimens and  should not be used as a sole basis for treatment. Nasal washings and  aspirates are unacceptable for Xpert Xpress SARS-CoV-2/FLU/RSV  testing.  Fact Sheet for Patients: https://www.moore.com/  Fact Sheet for Healthcare Providers: https://www.young.biz/  This test is not yet approved or cleared by the [place] FDA and  has been authorized for detection and/or diagnosis of SARS-CoV-2 by  FDA under an Emergency Use Authorization (EUA). This EUA will remain  in effect (meaning this test can be used) for the duration of the  Covid-19 declaration under Section 564(b)(1) of the Act, 21  U.S.C. section 360bbb-3(b)(1), unless the authorization is  terminated or revoked. Performed at [facility], [address] [place], [place] [address]   Culture, Urine     Status: Abnormal   Collection Time: 06/27/20 10:10 AM   Specimen: Urine, Clean Catch  Result Value Ref Range Status   Specimen Description URINE, CLEAN CATCH  Final   Special Requests   Final     NONE Performed at [facility], [address] [place], [place] [address]    Culture 80,000 COLONIES/mL CITROBACTER FREUNDII (A)  Final   Report Status 06/29/2020 FINAL  Final   Organism ID, Bacteria CITROBACTER FREUNDII (A)  Final      Susceptibility   Citrobacter freundii - MIC*    CEFAZOLIN >=64 RESISTANT Resistant     CEFTRIAXONE >=64 RESISTANT Resistant     CIPROFLOXACIN <=0.25 SENSITIVE Sensitive     GENTAMICIN <=1 SENSITIVE Sensitive     IMIPENEM 0.5 SENSITIVE Sensitive     NITROFURANTOIN <=16 SENSITIVE Sensitive     TRIMETH/SULFA <=20 SENSITIVE Sensitive     PIP/TAZO >=128 RESISTANT Resistant     * 80,000 COLONIES/mL CITROBACTER FREUNDII  SARS Coronavirus 2 by RT PCR (hospital order, performed in [facility] hospital lab) Nasopharyngeal Nasopharyngeal Swab     Status: None   Collection Time: 07/03/20  9:21 AM   Specimen: Nasopharyngeal Swab  Result Value Ref Range Status   SARS Coronavirus 2 NEGATIVE NEGATIVE Final    Comment: (NOTE) SARS-CoV-2 target nucleic acids are NOT DETECTED.  The SARS-CoV-2 RNA is generally detectable in upper and lower respiratory specimens during the acute phase of infection. The lowest concentration of SARS-CoV-2 viral copies this assay can detect is 250 copies / mL. A negative result does not preclude SARS-CoV-2 infection and should not be used as the sole basis for treatment or other patient management decisions.  A negative result may occur with improper specimen collection / handling, submission of specimen other than nasopharyngeal swab, presence of  viral mutation(s) within the areas targeted by this assay, and inadequate number of viral copies (<250 copies / mL). A negative result must be combined with clinical observations, patient history, and epidemiological information.  Fact Sheet for Patients:   BoilerBrush.com.cy  Fact Sheet for Healthcare  Providers: https://pope.com/  This test is not yet approved or  cleared by the [place] FDA and has been authorized for detection and/or diagnosis of SARS-CoV-2 by FDA under an Emergency Use Authorization (EUA).  This EUA will remain in effect (meaning this test can be used) for the duration of the COVID-19 declaration under Section 564(b)(1) of the Act, 21 U.S.C. section 360bbb-3(b)(1), unless the authorization is terminated or revoked sooner.  Performed at [facility], [address] [place], [place] [address]     Today   Subjective    [name] feels much improved since admission.  Is happy to go to rehab to regain her strength.  Denies chest pain, shortness of breath or abdominal pain.     Objective   Blood pressure (!) 131/59, pulse 70, temperature 97.8 F (36.6 C), temperature source Oral, resp. rate 17, height 5\' 10"  (1.778 m), weight 74.3 kg, SpO2 95 %.   Intake/Output Summary (Last 24 hours) at 07/03/2020 1106 Last data filed at 07/03/2020 0554 Gross per 24 hour  Intake 960 ml  Output 600 ml  Net 360 ml    Exam General: Patient appears well and in good spirits sitting up in bed in no acute distress.  Eyes: sclera anicteric, conjuctiva mild injection bilaterally CVS: S1-S2, regular  Respiratory:  decreased air entry bilaterally secondary to decreased inspiratory effort, rales at bases  GI: NABS, soft, NT  LE:  Patient has somewhat tender 1+ edema bilaterally, much improved from previous.  No evidence of cellulitis. Neuro: A/O x 3, Moving all extremities equally with normal strength, CN 3-12 intact, grossly nonfocal.  Psych: patient is logical and coherent, judgement and insight appear normal, mood and affect appropriate to situation.    Data Review   CBC w Diff:  Lab Results  Component Value Date   WBC 7.4 06/30/2020   HGB 13.0 06/30/2020   HGB 11.3 06/18/2020   HCT 39.4 06/30/2020   HCT 34.8 06/18/2020   PLT  288 06/30/2020   PLT 303 06/18/2020   LYMPHOPCT 14 06/24/2020   MONOPCT 8 06/24/2020   EOSPCT 2 06/24/2020   BASOPCT 1 06/24/2020    CMP:  Lab Results  Component Value Date   NA 134 (L) 07/02/2020   NA 140 06/18/2020   K 5.0 07/02/2020   CL 98 07/02/2020   CO2 29 07/02/2020   BUN 33 (H) 07/02/2020   BUN 27 06/18/2020   CREATININE 0.98 07/02/2020   PROT 7.1 01/03/2020   ALBUMIN 3.7 01/03/2020   BILITOT 0.4 01/03/2020   ALKPHOS 115 01/03/2020   AST 22 01/03/2020   ALT 10 01/03/2020  .   Total Time in preparing paper work, data evaluation and todays exam - 35 minutes  Pieter Partridge M.D on 07/03/2020 at 11:06 AM  [facility]   Office  [phone]

## 2020-07-03 NOTE — Progress Notes (Signed)
Cardiologist:  Teressa Lower   Subjective:  No cardiac complaints Maint NSR   Objective:  Vitals:   07/02/20 1954 07/02/20 2355 07/03/20 0006 07/03/20 0344  BP:  (!) 116/52  (!) 131/59  Pulse: 67 70  70  Resp: 19 16  17   Temp:  (!) 97.5 F (36.4 C)  97.8 F (36.6 C)  TempSrc:  Oral  Oral  SpO2: 95% 97%  95%  Weight:   74.3 kg   Height:        Intake/Output from previous day:  Intake/Output Summary (Last 24 hours) at 07/03/2020 0756 Last data filed at 07/03/2020 0554 Gross per 24 hour  Intake 960 ml  Output 600 ml  Net 360 ml    Physical Exam: Affect appropriate Healthy:  appears stated age HEENT: normal Neck supple with no adenopathy JVP normal no bruits no thyromegaly Lungs clear with no wheezing and good diaphragmatic motion Heart:  S1/S2 no murmur, no rub, gallop or click PMI normal Abdomen: benighn, BS positve, no tenderness, no AAA no bruit.  No HSM or HJR Distal pulses intact with no bruits No edema Neuro non-focal Skin  Contact dermatitis lower back  No muscular weakness   Lab Results: Basic Metabolic Panel: Recent Labs    07/02/20 0139  NA 134*  K 5.0  CL 98  CO2 29  GLUCOSE 87  BUN 33*  CREATININE 0.98  CALCIUM 8.7*   Liver Function Tests: No results for input(s): AST, ALT, ALKPHOS, BILITOT, PROT, ALBUMIN in the last 72 hours. No results for input(s): LIPASE, AMYLASE in the last 72 hours. CBC: No results for input(s): WBC, NEUTROABS, HGB, HCT, MCV, PLT in the last 72 hours.  Imaging: ECHO TEE  Result Date: 07/01/2020    TRANSESOPHOGEAL ECHO REPORT   Patient Name:   [name] [facility] Date of Exam: 07/01/2020 Medical Rec #:  08/31/2020      Height:       70.0 in Accession #:    [id number]     Weight:       161.4 lb Date of Birth:  1932-02-28       BSA:          1.905 m Patient Age:    86 years       BP:           85/49 mmHg Patient Gender: F              HR:           66 bpm. Exam Location:  Inpatient Procedure: Transesophageal Echo  Indications:     atrial fibrillation 427.31  History:         Patient has prior history of Echocardiogram examinations, most                  recent 06/25/2020. CAD; Risk Factors:Hypertension, Dyslipidemia                  and Former Smoker. Pulmonary hypertension.  Sonographer:     08/25/2020 RDCS (AE) Referring Phys:  Celene Skeen HAO MENG Diagnosing Phys: [id number] MD PROCEDURE: The transesophogeal probe was passed without difficulty through the esophogus of the patient. Sedation performed by different physician. The patient developed no complications during the procedure. A direct current cardioversion was performed. IMPRESSIONS  1. Left ventricular ejection fraction, by estimation, is 55 to 60%. The left ventricle has normal function.  2. Right ventricular systolic function is normal. The right ventricular size is normal.  3. Left atrial size was moderately dilated. No left atrial/left atrial appendage thrombus was detected.  4. The mitral valve is abnormal. Moderate mitral valve regurgitation. There is moderate prolapse of both leaflets of the mitral valve.  5. Tricuspid valve regurgitation is mild to moderate.  6. The aortic valve is tricuspid. Aortic valve regurgitation is mild. Mild aortic valve sclerosis is present, with no evidence of aortic valve stenosis.  7. There is Moderate (Grade III) plaque involving the descending aorta. FINDINGS  Left Ventricle: Left ventricular ejection fraction, by estimation, is 55 to 60%. The left ventricle has normal function. The left ventricular internal cavity size was normal in size. Right Ventricle: The right ventricular size is normal. Right vetricular wall thickness was not assessed. Right ventricular systolic function is normal. Left Atrium: Left atrial size was moderately dilated. No left atrial/left atrial appendage thrombus was detected. Right Atrium: Right atrial size was normal in size. Pericardium: There is no evidence of pericardial effusion. Mitral Valve:  The mitral valve is abnormal. There is moderate prolapse of both leaflets of the mitral valve. Moderate mitral valve regurgitation. Tricuspid Valve: The tricuspid valve is normal in structure. Tricuspid valve regurgitation is mild to moderate. Aortic Valve: The aortic valve is tricuspid. Aortic valve regurgitation is mild. Mild aortic valve sclerosis is present, with no evidence of aortic valve stenosis. Pulmonic Valve: The pulmonic valve was normal in structure. Pulmonic valve regurgitation is mild. Aorta: The aortic root is normal in size and structure. There is moderate (Grade III) plaque involving the descending aorta. IAS/Shunts: No atrial level shunt detected by color flow Doppler. Olga Millers MD Electronically signed by Olga Millers MD Signature Date/Time: 07/01/2020/1:02:59 PM    Final (Updated)    ECHO TEE  Result Date: 07/01/2020    TRANSESOPHOGEAL ECHO REPORT   Patient Name:   [name] [facility] Date of Exam: 07/01/2020 Medical Rec #:  [id number]      Height:       70.0 in Accession #:    [id number]     Weight:       161.4 lb Date of Birth:  12-09-1931       BSA:          1.905 m Patient Age:    86 years       BP:           85/49 mmHg Patient Gender: F              HR:           66 bpm. Exam Location:  Inpatient Procedure: Transesophageal Echo Indications:    atrial fibrillation 427.31  History:        Patient has prior history of Echocardiogram examinations, most                 recent 06/25/2020. CAD; Risk Factors:Hypertension, Dyslipidemia                 and Former Smoker. Pulmonary hypertension.  Sonographer:    Celene Skeen RDCS (AE) Referring Phys: [phone] HAO MENG PROCEDURE: The transesophogeal probe was passed without difficulty through the esophogus of the patient. Sedation performed by different physician. The patient developed no complications during the procedure. A direct current cardioversion was performed. IMPRESSIONS  1. Left ventricular ejection fraction, by estimation, is 55 to 60%.  The left ventricle has normal function.  2. Right ventricular systolic function is normal. The right ventricular size is normal.  3. Left atrial size was moderately dilated.  No left atrial/left atrial appendage thrombus was detected.  4. The mitral valve is abnormal. Moderate mitral valve regurgitation. There is moderate prolapse of both leaflets of the mitral valve.  5. Tricuspid valve regurgitation is mild to moderate.  6. The aortic valve is tricuspid. Aortic valve regurgitation is mild. Mild aortic valve sclerosis is present, with no evidence of aortic valve stenosis.  7. There is Moderate (Grade III) plaque involving the descending aorta. FINDINGS  Left Ventricle: Left ventricular ejection fraction, by estimation, is 55 to 60%. The left ventricle has normal function. The left ventricular internal cavity size was normal in size. Right Ventricle: The right ventricular size is normal. Right vetricular wall thickness was not assessed. Right ventricular systolic function is normal. Left Atrium: Left atrial size was moderately dilated. No left atrial/left atrial appendage thrombus was detected. Right Atrium: Right atrial size was normal in size. Pericardium: There is no evidence of pericardial effusion. Mitral Valve: The mitral valve is abnormal. There is moderate prolapse of both leaflets of the mitral valve. Moderate mitral valve regurgitation. Tricuspid Valve: The tricuspid valve is normal in structure. Tricuspid valve regurgitation is mild to moderate. Aortic Valve: The aortic valve is tricuspid. Aortic valve regurgitation is mild. Mild aortic valve sclerosis is present, with no evidence of aortic valve stenosis. Pulmonic Valve: The pulmonic valve was normal in structure. Pulmonic valve regurgitation is mild. Aorta: The aortic root is normal in size and structure. There is moderate (Grade III) plaque involving the descending aorta. IAS/Shunts: No atrial level shunt detected by color flow Doppler. Olga Millers  MD Electronically signed by Olga Millers MD Signature Date/Time: 07/01/2020/1:02:59 PM    Final     Cardiac Studies:  ECG: afib non specific ST changes    Telemetry:  NSR rates 70   Echo: EF 55% moderate LAE mild MR  LA 44 mm   Medications:    apixaban  5 mg Oral BID   atorvastatin  10 mg Oral Daily   calamine   Topical TID   carvedilol  3.125 mg Oral BID WC   diltiazem  120 mg Oral Daily   furosemide  20 mg Oral Daily   irbesartan  75 mg Oral Daily   levothyroxine  125 mcg Oral Daily   sodium chloride flush  3 mL Intravenous Q12H      sodium chloride      Assessment/Plan:   1. Afib:  On eliquis , post TEE/DCC 07/01/20 maint NSR continue coreg , cardizem and eliquis  2. Diastolic CHF:  Now on PO lasix K/Cr fine yesterday     3. HTN:  Well controlled.  Continue current medications and low sodium Dash type diet.    4. HLD on statin   ? Awaiting rehab cardiology will sign off Will arrange outpatient f/u with Dr Mauricia [facility] 07/03/2020, 7:56 AM

## 2020-07-03 NOTE — Care Management Important Message (Signed)
Important Message  Patient Details  Name: [name] MRN: [id number] Date of Birth: [birth year]   Medicare Important Message Given:  Yes     Renie Ora 07/03/2020, 8:57 AM

## 2020-07-03 NOTE — Plan of Care (Signed)

## 2020-07-03 NOTE — TOC Transition Note (Addendum)
Transition of Care [facility]) - CM/SW Discharge Note   Patient Details  Name: WENDY HOBACK MRN: [id number] Date of Birth: 1932/08/22  Transition of Care [facility]) CM/SW Contact:  Carmina Miller, LCSWA Phone Number: 07/03/2020, 1:13 PM   Clinical Narrative:    Patient will DC to: [facility] Anticipated DC date: 07/03/20 Family notified: Milta Deiters (left vm) Transport by: Sharin Mons   Per MD patient ready for DC to [facility]. RN to call report prior to discharge [phone]. RN, patient, patient's family, and facility notified of DC. Discharge Summary and FL2 sent to facility. DC packet on chart. Ambulance transport requested for patient.   CSW will sign off for now as social work intervention is no longer needed. Please consult [place] again if new needs arise.     Final next level of care: Skilled Nursing Facility Barriers to Discharge: Continued Medical Work up, English as a second language teacher   Patient Goals and CMS Choice Patient states their goals for this hospitalization and ongoing recovery are:: get better CMS Medicare.gov Compare Post Acute Care list provided to:: Patient Choice offered to / list presented to : Patient  Discharge Placement                       Discharge Plan and Services In-house Referral: Clinical Social Work              DME Arranged: Dan Humphreys rolling with seat DME Agency: [employer] Date DME Agency Contacted: 06/27/20 Time DME Agency Contacted: [phone] Representative spoke with at DME Agency: NA HH Arranged: NA HH Agency: NA Date HH Agency Contacted: 06/27/20 Time HH Agency Contacted: [phone] Representative spoke with at [facility] Agency: NA  Social Determinants of Health (SDOH) Interventions     Readmission Risk Interventions No flowsheet data found.

## 2020-07-04 ENCOUNTER — Encounter: Payer: Self-pay | Admitting: Internal Medicine

## 2020-07-04 ENCOUNTER — Non-Acute Institutional Stay (SKILLED_NURSING_FACILITY): Payer: Medicare PPO | Admitting: Internal Medicine

## 2020-07-04 DIAGNOSIS — I5033 Acute on chronic diastolic (congestive) heart failure: Secondary | ICD-10-CM | POA: Diagnosis not present

## 2020-07-04 DIAGNOSIS — N3001 Acute cystitis with hematuria: Secondary | ICD-10-CM

## 2020-07-04 DIAGNOSIS — L2389 Allergic contact dermatitis due to other agents: Secondary | ICD-10-CM

## 2020-07-04 DIAGNOSIS — E039 Hypothyroidism, unspecified: Secondary | ICD-10-CM | POA: Diagnosis not present

## 2020-07-04 DIAGNOSIS — I2510 Atherosclerotic heart disease of native coronary artery without angina pectoris: Secondary | ICD-10-CM | POA: Diagnosis not present

## 2020-07-04 DIAGNOSIS — I480 Paroxysmal atrial fibrillation: Secondary | ICD-10-CM | POA: Diagnosis not present

## 2020-07-04 DIAGNOSIS — S81851S Open bite, right lower leg, sequela: Secondary | ICD-10-CM

## 2020-07-04 DIAGNOSIS — M1A09X0 Idiopathic chronic gout, multiple sites, without tophus (tophi): Secondary | ICD-10-CM

## 2020-07-04 DIAGNOSIS — M810 Age-related osteoporosis without current pathological fracture: Secondary | ICD-10-CM

## 2020-07-04 DIAGNOSIS — W5501XS Bitten by cat, sequela: Secondary | ICD-10-CM

## 2020-07-04 DIAGNOSIS — M48062 Spinal stenosis, lumbar region with neurogenic claudication: Secondary | ICD-10-CM

## 2020-07-04 DIAGNOSIS — I10 Essential (primary) hypertension: Secondary | ICD-10-CM

## 2020-07-04 DIAGNOSIS — Z66 Do not resuscitate: Secondary | ICD-10-CM

## 2020-07-04 MED ORDER — HYDROCORTISONE 1 % EX CREA: 1.0000 "application " | TOPICAL_CREAM | Freq: Two times a day (BID) | CUTANEOUS | 0 refills | Status: AC

## 2020-07-04 NOTE — Progress Notes (Signed)
Provider:  Gwenith Spitz. Renato Gails, D.O., C.M.D. Location:  [facility] Room Number: [phone] Place of Service:  SNF ([phone])  PCP: Everlean Cherry, MD Patient Care Team: Everlean Cherry, MD as PCP - General (Family Medicine) Thomasene Ripple, DO as PCP - Cardiology (Cardiology)  Extended Emergency Contact Information Primary Emergency Contact: Schreckengost,Marc Address: [address]          [place], [place] [address] [place] of [place] Home Phone: [phone] Relation: Son  Code Status: DNR Goals of Care: Advanced Directive information Advanced Directives 07/04/2020  Does Patient Have a Medical Advance Directive? Yes  Type of Advance Directive Out of facility DNR (pink MOST or yellow form)  Does patient want to make changes to medical advance directive? No - Patient declined  Would patient like information on creating a medical advance directive? -   Chief Complaint  Patient presents with  . New Admit To SNF    New admission to [facility] SNF   HPI: Patient is an 86 y.o. female seen today for admission to [facility] status post hospitalization from October 5 to October 14 at [facility].  She has an extensive medical history including chronic diastolic heart failure, moderate tricuspid regurgitation, coronary artery disease, hypothyroidism, mitral regurgitation, pulmonary hypertension, prior tobacco abuse, chronic gout, peripheral neuropathy, chronic kidney disease stage II, depression, cervical degenerative disc disease, hyperlipidemia, venous insufficiency with stasis dermatitis, osteopenia, vitamin D deficiency, hypothyroidism, urge incontinence among others.  She presented to the med [facility] emergency department initially from her cardiology office with lower extremity edema.  She also has a history of persistent atrial fibrillation on Eliquis and right bundle branch block.  Her ejection fraction has been 65 to 70% with grade 2 diastolic  dysfunction on her transthoracic echo February 10 of this year.  Her edema had gotten progressively worse recently.  She had actually been hospitalized at [facility] on September 22.  She had been there initially for a cat bite on her leg but was found to be in rapid atrial fibrillation.  She was discharged from there on oral diltiazem and Eliquis and has also been on oral Lasix since that time.  About a week before her head admission October 5 she developed increasing lower extremity edema on both legs.  She was having dyspnea with minimal exertion and profound fatigue.  She denied chest pain or palpitations.  She apparently had another cat bite on her right lower leg 2 weeks ago.  In the med [facility] ED vitals were stable labs were pretty unremarkable outside of the BNP of 346. Chest x-ray showed an enlarged cardiac silhouette without focal consolidation, edema or effusion.  Covid test was negative.  She was given IV Lasix 40 mg once and started on oral Augmentin for the cat bite.  EKG showed atrial fibrillation at a controlled rate with right bundle branch block when redone at [facility].  Admitted to [facility] and continued on 40 mg IV Lasix twice daily, strict I&O's and daily weights and repeat echocardiogram.  Her diuresis was complicated by orthostatic hypotension and overall soft BP.  Her ARB dosage was decreased as was her Coreg.  Her heart rate then went up.  Cardiology  recommended cardioversion which she underwent on October 12 with excellent result in return to normal sinus rhythm and blood pressure.  She was then able to be diuresed well with a marked decrease in her lower  extremity edema.  She was resumed on her home dose of Lasix 20 mg p.o. daily and low-sodium diet.  Here she will require weights twice a week per the facility protocol for heart failure patients.  We will be notified if she has a weight gain of 3 pounds during that period or 5 pounds over a week.  She is to  follow-up in about 1 month with Dr. Thomasene RippleKardie Tobb, cardiology.    For her cat bite it did not appear actively infected but she was treated with prophylactic Augmentin for 3 days.    Her stay was also complicated by an episode of gross hematuria which resolved after treatment with nitrofurantoin for 3 days.  Urine culture grew out only 80,000 colony-forming units of Citrobacter.  She also had an elevated TSH of 8.5 so her Synthroid was increased to 125 mcg;, however she was sent here on 100 mcg due to recent cardioversion.  It was felt that she was having subclinical hypothyroidism.  She may still need adjustment as an outpatient--I would recommend rechecking TSH in 6 weeks.  PT OT evaluated her during her admission and determined that SNF was needed for PT OT to be continued.  She has had her Pfizer Covid vaccines January 17 and February 6 of this year.  When seen, she was doing well.  Her biggest complaint was a rash on her back that seemed to develop after chucks were used at the hospital--she wonders if she's allergic to something in them.    Past Medical History:  Diagnosis Date  . Abnormal perfusion scintigraphy 07/22/2014   Lexiscan MPS Jan 2012 with mild mis and apical anterior ischemia EF 59% [facility] Lexiscan MPS Jan 2012 with mild mis and apical anterior ischemia EF 59% [facility]  Formatting of this note might be different from the original. Overview:  Lexiscan MPS Jan 2012 with mild mis and apical anterior ischemia EF 59% [facility] Formatting of this note might be different from  . Anxiety disorder 07/22/2014  . B12 deficiency 07/22/2014  . Benign hypertensive heart and kidney disease with chronic kidney disease 07/22/2014  . CAD (coronary artery disease) 01/02/2020  . Chronic kidney disease, stage 2 (mild) 07/22/2014  . Degeneration, intervertebral disc, cervical 07/22/2014  . Depression, major, single episode, moderate (HCC) 07/22/2014  . Essential  hypertension 09/10/2019  . Former smoker 10/20/2017  . Hypercholesterolemia 07/22/2014  . HYPOTHYROIDISM, POST-RADIATION 06/11/2009   Qualifier: Diagnosis of  By: Everardo AllEllison MD, Cleophas DunkerSean A   . Idiopathic chronic gout of multiple sites without tophus 12/11/2015  . Idiopathic peripheral neuropathy 12/11/2015  . Low back pain 07/22/2014  . Mitral regurgitation 09/10/2019  . Onychomycosis due to dermatophyte 07/22/2014  . Osteoarthritis, generalized 07/22/2014  . Osteoporosis 08/24/2018   DEXA 08/23/2018  Formatting of this note might be different from the original. DEXA 08/23/2018  . Pulmonary hypertension (HCC) 12/08/2017  . PVC (premature ventricular contraction) 04/17/2018  . RBBB 10/20/2017  . Recurrent UTI 12/11/2015  . Sciatica 07/22/2014  . Spinal stenosis of lumbar region 12/11/2015  . TMJ arthralgia 07/22/2014  . Unspecified abnormalities of gait and mobility 07/22/2014  . Urge incontinence of urine 07/22/2014   Wears 8 extra absorbent pads during day an on overnight pad at night  Formatting of this note might be different from the original. Wears 8 extra absorbent pads during day an on overnight pad at night  . Varicose veins 07/22/2014  . Venous  stasis dermatitis of both lower extremities 07/22/2014  . Vitamin D deficiency 07/22/2014   Past Surgical History:  Procedure Laterality Date  . BACK SURGERY    . CARDIOVERSION N/A 07/01/2020   Procedure: CARDIOVERSION;  Surgeon: Lewayne Bunting, MD;  Location: [facility] ENDOSCOPY;  Service: Cardiovascular;  Laterality: N/A;  . JOINT REPLACEMENT     hip and knee  . TEE WITHOUT CARDIOVERSION N/A 07/01/2020   Procedure: TRANSESOPHAGEAL ECHOCARDIOGRAM (TEE);  Surgeon: Lewayne Bunting, MD;  Location: [facility] ENDOSCOPY;  Service: Cardiovascular;  Laterality: N/A;    Social History   Socioeconomic History  . Marital status: Widowed    Spouse name: Not on file  . Number of children: Not on file  . Years of education: Not on file  . Highest education level: Not on  file  Occupational History  . Not on file  Tobacco Use  . Smoking status: Never Smoker  . Smokeless tobacco: Never Used  Substance and Sexual Activity  . Alcohol use: No  . Drug use: No  . Sexual activity: Not on file  Other Topics Concern  . Not on file  Social History Narrative  . Not on file   Social Determinants of Health   Financial Resource Strain:   . Difficulty of Paying Living Expenses: Not on file  Food Insecurity:   . Worried About Programme researcher, broadcasting/film/video in the Last Year: Not on file  . Ran Out of Food in the Last Year: Not on file  Transportation Needs:   . Lack of Transportation (Medical): Not on file  . Lack of Transportation (Non-Medical): Not on file  Physical Activity:   . Days of Exercise per Week: Not on file  . Minutes of Exercise per Session: Not on file  Stress:   . Feeling of Stress : Not on file  Social Connections:   . Frequency of Communication with Friends and Family: Not on file  . Frequency of Social Gatherings with Friends and Family: Not on file  . Attends Religious Services: Not on file  . Active Member of Clubs or Organizations: Not on file  . Attends Banker Meetings: Not on file  . Marital Status: Not on file    reports that she has never smoked. She has never used smokeless tobacco. She reports that she does not drink alcohol and does not use drugs.  Functional Status Survey:    Family History  Problem Relation Age of Onset  . Cancer Mother   . Hypertension Mother   . Prostate cancer Father   . Heart attack Brother   . Heart disease Brother   . Emphysema Brother     Health Maintenance  Topic Date Due  . COVID-19 Vaccine (1) Never done  . DEXA SCAN  Never done  . INFLUENZA VACCINE  04/20/2020  . TETANUS/TDAP  06/24/2030  . PNA vac Low Risk Adult  Completed    Allergies  Allergen Reactions  . Isosorbide Nitrate Other (See Comments)    headache  . Cephalexin Rash  . Gentamicin Dermatitis  . Penicillins Rash   . Sulfa Antibiotics Palpitations    Outpatient Encounter Medications as of 07/04/2020  Medication Sig  . acetaminophen (TYLENOL) 500 MG tablet Take 1,000 mg by mouth every 6 (six) hours as needed for mild pain.  Marland Kitchen allopurinol (ZYLOPRIM) 100 MG tablet Take 100 mg by mouth daily.  Marland Kitchen atorvastatin (LIPITOR) 10 MG tablet Take 1 tablet (10 mg total) by mouth daily.  . carvedilol (  COREG) 3.125 MG tablet Take 1 tablet (3.125 mg total) by mouth 2 (two) times daily with a meal.  . cholecalciferol (VITAMIN D) 1000 UNITS tablet Take 1,000 Units by mouth daily.  . colchicine 0.6 MG tablet Take 0.6 mg by mouth daily.  . diclofenac Sodium (VOLTAREN) 1 % GEL Apply 2 g topically 3 (three) times daily as needed for up to 10 days (pain).  Marland Kitchen diltiazem (CARDIZEM CD) 120 MG 24 hr capsule Take 1 capsule (120 mg total) by mouth daily.  Marland Kitchen ELIQUIS 5 MG TABS tablet Take 5 mg by mouth 2 (two) times daily.  . furosemide (LASIX) 20 MG tablet Take 1 tablet (20 mg total) by mouth daily.  Marland Kitchen levothyroxine (SYNTHROID, LEVOTHROID) 100 MCG tablet Take 100 mcg by mouth daily.  Marland Kitchen telmisartan (MICARDIS) 20 MG tablet Take 20 mg by mouth daily.   . vitamin B-12 (CYANOCOBALAMIN) 1000 MCG tablet Take 1,000 mcg by mouth daily.   No facility-administered encounter medications on file as of 07/04/2020.    Review of Systems  Constitutional: Negative for chills, fever and malaise/fatigue.  HENT: Negative for congestion and sore throat.   Eyes: Negative for blurred vision.       Glasses  Respiratory: Positive for shortness of breath. Negative for cough.        Had dyspnea previously--much improved  Cardiovascular: Positive for leg swelling. Negative for chest pain, palpitations, orthopnea, claudication and PND.       Describes the odd discomfort she had radiating up her neck when she was sob before going to hospital; had "huge legs" before hospitalization  Gastrointestinal: Negative for abdominal pain, blood in stool, constipation  and melena.  Genitourinary: Negative for dysuria.  Musculoskeletal: Positive for back pain. Negative for falls and joint pain.  Skin: Positive for itching and rash.       All over back  Neurological: Negative for dizziness and loss of consciousness.  Psychiatric/Behavioral: Negative for depression and memory loss. The patient is not nervous/anxious and does not have insomnia.     Vitals:   07/04/20 1229  BP: 109/67  Pulse: 70  Resp: 18  Temp: (!) 97.2 F (36.2 C)  Weight: 163 lb 11.2 oz (74.3 kg)  Height: 5\' 10"  (1.778 m)   Body mass index is 23.49 kg/m. Physical Exam Vitals reviewed.  Constitutional:      General: She is not in acute distress.    Appearance: Normal appearance. She is not toxic-appearing.  HENT:     Head: Normocephalic and atraumatic.     Right Ear: External ear normal.     Left Ear: External ear normal.     Nose: Nose normal.     Mouth/Throat:     Pharynx: Oropharynx is clear.  Eyes:     Extraocular Movements: Extraocular movements intact.     Conjunctiva/sclera: Conjunctivae normal.     Pupils: Pupils are equal, round, and reactive to light.     Comments: glasses  Cardiovascular:     Rate and Rhythm: Normal rate and regular rhythm.     Pulses: Normal pulses.     Heart sounds: Normal heart sounds.  Pulmonary:     Effort: Pulmonary effort is normal.     Breath sounds: Normal breath sounds. No rales.  Abdominal:     General: Bowel sounds are normal. There is no distension.     Palpations: Abdomen is soft.     Tenderness: There is no abdominal tenderness. There is no guarding or rebound.  Musculoskeletal:  General: Normal range of motion.     Cervical back: Neck supple.     Right lower leg: No edema.     Left lower leg: No edema.     Comments: No pitting now, has larger legs  Lymphadenopathy:     Cervical: No cervical adenopathy.  Skin:    General: Skin is warm and dry.     Capillary Refill: Capillary refill takes less than 2 seconds.      Coloration: Skin is pale.  Neurological:     General: No focal deficit present.     Mental Status: She is alert and oriented to person, place, and time.     Cranial Nerves: No cranial nerve deficit.     Motor: No weakness.     Gait: Gait normal.  Psychiatric:        Mood and Affect: Mood normal.        Behavior: Behavior normal.        Thought Content: Thought content normal.        Judgment: Judgment normal.     Labs reviewed: Basic Metabolic Panel: Recent Labs    06/25/20 0811 06/26/20 0606 06/29/20 0252 06/30/20 0726 07/02/20 0139  NA 138   < > 137 137 134*  K 3.6   < > 4.8 4.6 5.0  CL 98   < > 96* 97* 98  CO2 30   < > 32 32 29  GLUCOSE 140*   < > 110* 99 87  BUN 16   < > 33* 27* 33*  CREATININE 1.06*   < > 1.17* 1.02* 0.98  CALCIUM 8.8*   < > 9.0 9.1 8.7*  MG 1.8  --  1.9 2.1  --    < > = values in this interval not displayed.   Liver Function Tests: Recent Labs    01/03/20 0911  AST 22  ALT 10  ALKPHOS 115  BILITOT 0.4  PROT 7.1  ALBUMIN 3.7   No results for input(s): LIPASE, AMYLASE in the last 8760 hours. No results for input(s): AMMONIA in the last 8760 hours. CBC: Recent Labs    06/18/20 1127 06/24/20 1340 06/25/20 0811 06/27/20 0325 06/28/20 0357 06/30/20 0726  WBC 7.8 7.4   < > 8.4 8.3 7.4  NEUTROABS 5.7 5.6  --   --   --   --   HGB 11.3 12.3   < > 12.4 12.2 13.0  HCT 34.8 35.9*   < > 36.5 36.5 39.4  MCV 98* 96.8   < > 98.1 96.1 97.0  PLT 303 289   < > 240 294 288   < > = values in this interval not displayed.   Cardiac Enzymes: No results for input(s): CKTOTAL, CKMB, CKMBINDEX, TROPONINI in the last 8760 hours. BNP: Invalid input(s): POCBNP No results found for: HGBA1C Lab Results  Component Value Date   TSH 8.527 (H) 06/25/2020   No results found for: VITAMINB12 No results found for: FOLATE No results found for: IRON, TIBC, FERRITIN  Imaging and Procedures obtained prior to SNF admission: ECHO TEE  Result Date:  07/01/2020    TRANSESOPHOGEAL ECHO REPORT   Patient Name:   [name] [facility] Date of Exam: 07/01/2020 Medical Rec #:  [id number]      Height:       70.0 in Accession #:    [id number]     Weight:       161.4 lb Date of Birth:  1932-08-22  BSA:          1.905 m Patient Age:    86 years       BP:           85/49 mmHg Patient Gender: F              HR:           66 bpm. Exam Location:  Inpatient Procedure: Transesophageal Echo Indications:     atrial fibrillation 427.31  History:         Patient has prior history of Echocardiogram examinations, most                  recent 06/25/2020. CAD; Risk Factors:Hypertension, Dyslipidemia                  and Former Smoker. Pulmonary hypertension.  Sonographer:     Celene Skeen RDCS (AE) Referring Phys:  [id number] HAO MENG Diagnosing Phys: Olga Millers MD PROCEDURE: The transesophogeal probe was passed without difficulty through the esophogus of the patient. Sedation performed by different physician. The patient developed no complications during the procedure. A direct current cardioversion was performed. IMPRESSIONS  1. Left ventricular ejection fraction, by estimation, is 55 to 60%. The left ventricle has normal function.  2. Right ventricular systolic function is normal. The right ventricular size is normal.  3. Left atrial size was moderately dilated. No left atrial/left atrial appendage thrombus was detected.  4. The mitral valve is abnormal. Moderate mitral valve regurgitation. There is moderate prolapse of both leaflets of the mitral valve.  5. Tricuspid valve regurgitation is mild to moderate.  6. The aortic valve is tricuspid. Aortic valve regurgitation is mild. Mild aortic valve sclerosis is present, with no evidence of aortic valve stenosis.  7. There is Moderate (Grade III) plaque involving the descending aorta. FINDINGS  Left Ventricle: Left ventricular ejection fraction, by estimation, is 55 to 60%. The left ventricle has normal function. The left ventricular  internal cavity size was normal in size. Right Ventricle: The right ventricular size is normal. Right vetricular wall thickness was not assessed. Right ventricular systolic function is normal. Left Atrium: Left atrial size was moderately dilated. No left atrial/left atrial appendage thrombus was detected. Right Atrium: Right atrial size was normal in size. Pericardium: There is no evidence of pericardial effusion. Mitral Valve: The mitral valve is abnormal. There is moderate prolapse of both leaflets of the mitral valve. Moderate mitral valve regurgitation. Tricuspid Valve: The tricuspid valve is normal in structure. Tricuspid valve regurgitation is mild to moderate. Aortic Valve: The aortic valve is tricuspid. Aortic valve regurgitation is mild. Mild aortic valve sclerosis is present, with no evidence of aortic valve stenosis. Pulmonic Valve: The pulmonic valve was normal in structure. Pulmonic valve regurgitation is mild. Aorta: The aortic root is normal in size and structure. There is moderate (Grade III) plaque involving the descending aorta. IAS/Shunts: No atrial level shunt detected by color flow Doppler. Olga Millers MD Electronically signed by Olga Millers MD Signature Date/Time: 07/01/2020/1:02:59 PM    Final (Updated)    ECHO TEE  Result Date: 07/01/2020    TRANSESOPHOGEAL ECHO REPORT   Patient Name:   BRENETTA PENNY [facility] Date of Exam: 07/01/2020 Medical Rec #:  [id number]      Height:       70.0 in Accession #:    [id number]     Weight:       161.4 lb Date of Birth:  09-04-1932  BSA:          1.905 m Patient Age:    86 years       BP:           85/49 mmHg Patient Gender: F              HR:           66 bpm. Exam Location:  Inpatient Procedure: Transesophageal Echo Indications:    atrial fibrillation 427.31  History:        Patient has prior history of Echocardiogram examinations, most                 recent 06/25/2020. CAD; Risk Factors:Hypertension, Dyslipidemia                 and Former Smoker.  Pulmonary hypertension.  Sonographer:    Celene Skeen RDCS (AE) Referring Phys: [phone] HAO MENG PROCEDURE: The transesophogeal probe was passed without difficulty through the esophogus of the patient. Sedation performed by different physician. The patient developed no complications during the procedure. A direct current cardioversion was performed. IMPRESSIONS  1. Left ventricular ejection fraction, by estimation, is 55 to 60%. The left ventricle has normal function.  2. Right ventricular systolic function is normal. The right ventricular size is normal.  3. Left atrial size was moderately dilated. No left atrial/left atrial appendage thrombus was detected.  4. The mitral valve is abnormal. Moderate mitral valve regurgitation. There is moderate prolapse of both leaflets of the mitral valve.  5. Tricuspid valve regurgitation is mild to moderate.  6. The aortic valve is tricuspid. Aortic valve regurgitation is mild. Mild aortic valve sclerosis is present, with no evidence of aortic valve stenosis.  7. There is Moderate (Grade III) plaque involving the descending aorta. FINDINGS  Left Ventricle: Left ventricular ejection fraction, by estimation, is 55 to 60%. The left ventricle has normal function. The left ventricular internal cavity size was normal in size. Right Ventricle: The right ventricular size is normal. Right vetricular wall thickness was not assessed. Right ventricular systolic function is normal. Left Atrium: Left atrial size was moderately dilated. No left atrial/left atrial appendage thrombus was detected. Right Atrium: Right atrial size was normal in size. Pericardium: There is no evidence of pericardial effusion. Mitral Valve: The mitral valve is abnormal. There is moderate prolapse of both leaflets of the mitral valve. Moderate mitral valve regurgitation. Tricuspid Valve: The tricuspid valve is normal in structure. Tricuspid valve regurgitation is mild to moderate. Aortic Valve: The aortic valve is  tricuspid. Aortic valve regurgitation is mild. Mild aortic valve sclerosis is present, with no evidence of aortic valve stenosis. Pulmonic Valve: The pulmonic valve was normal in structure. Pulmonic valve regurgitation is mild. Aorta: The aortic root is normal in size and structure. There is moderate (Grade III) plaque involving the descending aorta. IAS/Shunts: No atrial level shunt detected by color flow Doppler. Olga Millers MD Electronically signed by Olga Millers MD Signature Date/Time: 07/01/2020/1:02:59 PM    Final     Assessment/Plan 1. Acute on chronic diastolic (congestive) heart failure (HCC) -acute part resolved, continues on daily lasix and rate control with cardizem and coreg -appears euvolemic -f/u cbc, bmp at one week esp for potassium and renal function  2. PAF (paroxysmal atrial fibrillation) (HCC) -s/p cardioversion 10/12 and in sinus at present -f/u with cardiology as planned  3. Coronary artery disease involving native coronary artery of native heart without angina pectoris -cont lipitor, coreg, telmisartan, not on asa due  eliquis  4. Acquired hypothyroidism -cont levothyroxine , f/u tsh outpatient in 6 wks  5. Essential hypertension -bp at goal with current regimen, cont same and monitor  6. Cat bite of right lower leg, sequela -cellulitis resolved, was outside kitten that she's been feeding and not truly her cat -has bite marks remaining   7. Spinal stenosis of lumbar region with neurogenic claudication -at baseline, uses tylenol, positioning, topical voltaren, not on other meds  8. Acute cystitis with hematuria -resolved with abx  9. Idiopathic chronic gout of multiple sites without tophus -cont chronic allopurinol, no signs of acute flare -also on colchicine which would typically be for a few months after initial flare to keep uric acid levels contained and then stopped  10. Age related osteoporosis, unspecified pathological fracture  presence -continues on vitamin D it appears only, normally active and spunky--just moved out of her home and into her mother's home with a lovely porch she can sit on with the two cats  Contact dermatitis:  Will prescribe a course of 1% hydrocortisone for her back bid until resolved  UEA:VWUJ on file per her request  Family/ staff Communication: d/w SNF nurse  Labs/tests ordered:  Cbc, bmp at one week, wts  Febe Champa L. Jennye Runquist, D.O. Geriatrics [facility] [facility] [address], [place] [address] Cell Phone (Mon-Fri 8am-5pm):  [phone] On Call:  [phone] & follow prompts after 5pm & weekends Office Phone:  [phone] Office Fax:  [phone]

## 2020-07-10 LAB — CBC AND DIFFERENTIAL
HCT: 36 (ref 36–46)
Hemoglobin: 11.8 — AB (ref 12.0–16.0)
Platelets: 215 (ref 150–399)
WBC: 6.1

## 2020-07-10 LAB — COMPREHENSIVE METABOLIC PANEL
Calcium: 9.2 (ref 8.7–10.7)
GFR calc Af Amer: 67.97
GFR calc non Af Amer: 58.65

## 2020-07-10 LAB — BASIC METABOLIC PANEL
BUN: 30 — AB (ref 4–21)
CO2: 31 — AB (ref 13–22)
Chloride: 101 (ref 99–108)
Creatinine: 0.9 (ref 0.5–1.1)
Glucose: 77
Potassium: 4.8 (ref 3.4–5.3)
Sodium: 139 (ref 137–147)

## 2020-07-10 LAB — CBC: RBC: 3.63 — AB (ref 3.87–5.11)

## 2020-07-14 ENCOUNTER — Encounter: Payer: Self-pay | Admitting: Family

## 2020-07-14 ENCOUNTER — Non-Acute Institutional Stay (SKILLED_NURSING_FACILITY): Payer: Medicare PPO | Admitting: Family

## 2020-07-14 DIAGNOSIS — I4819 Other persistent atrial fibrillation: Secondary | ICD-10-CM

## 2020-07-14 DIAGNOSIS — W5501XS Bitten by cat, sequela: Secondary | ICD-10-CM

## 2020-07-14 DIAGNOSIS — M1A09X0 Idiopathic chronic gout, multiple sites, without tophus (tophi): Secondary | ICD-10-CM

## 2020-07-14 DIAGNOSIS — E7800 Pure hypercholesterolemia, unspecified: Secondary | ICD-10-CM

## 2020-07-14 DIAGNOSIS — E039 Hypothyroidism, unspecified: Secondary | ICD-10-CM | POA: Diagnosis not present

## 2020-07-14 DIAGNOSIS — I5032 Chronic diastolic (congestive) heart failure: Secondary | ICD-10-CM

## 2020-07-14 DIAGNOSIS — M810 Age-related osteoporosis without current pathological fracture: Secondary | ICD-10-CM

## 2020-07-14 DIAGNOSIS — M48062 Spinal stenosis, lumbar region with neurogenic claudication: Secondary | ICD-10-CM | POA: Diagnosis not present

## 2020-07-14 DIAGNOSIS — I10 Essential (primary) hypertension: Secondary | ICD-10-CM | POA: Diagnosis not present

## 2020-07-14 DIAGNOSIS — S81851S Open bite, right lower leg, sequela: Secondary | ICD-10-CM

## 2020-07-14 MED ORDER — COLCHICINE 0.6 MG PO TABS: 0.6000 mg | ORAL_TABLET | Freq: Every day | ORAL | 0 refills | Status: DC

## 2020-07-14 MED ORDER — LEVOTHYROXINE SODIUM 100 MCG PO TABS
100.0000 ug | ORAL_TABLET | Freq: Every day | ORAL | 0 refills | Status: AC
Start: 2020-07-14 — End: ?

## 2020-07-14 MED ORDER — CARVEDILOL 3.125 MG PO TABS: 3.1250 mg | ORAL_TABLET | Freq: Two times a day (BID) | ORAL | 0 refills | Status: DC

## 2020-07-14 MED ORDER — ATORVASTATIN CALCIUM 10 MG PO TABS: 10.0000 mg | ORAL_TABLET | Freq: Every day | ORAL | 0 refills | Status: DC

## 2020-07-14 MED ORDER — ELIQUIS 5 MG PO TABS: 5.0000 mg | ORAL_TABLET | Freq: Two times a day (BID) | ORAL | 0 refills | Status: DC

## 2020-07-14 MED ORDER — FUROSEMIDE 20 MG PO TABS: 20.0000 mg | ORAL_TABLET | Freq: Every day | ORAL | 0 refills | Status: DC

## 2020-07-14 MED ORDER — DILTIAZEM HCL ER COATED BEADS 120 MG PO CP24: 120.0000 mg | ORAL_CAPSULE | Freq: Every day | ORAL | 0 refills | Status: DC

## 2020-07-14 MED ORDER — ALLOPURINOL 100 MG PO TABS: 100.0000 mg | ORAL_TABLET | Freq: Every day | ORAL | 0 refills | Status: DC

## 2020-07-14 MED ORDER — TELMISARTAN 20 MG PO TABS: 20.0000 mg | ORAL_TABLET | Freq: Every day | ORAL | 0 refills | Status: DC

## 2020-07-14 NOTE — Progress Notes (Signed)
[facility] Room Number: 104-P Place of Service:  SNF [phone])  Provider: Richarda Blade FNP-C   PCP: Everlean Cherry, MD Patient Care Team: Everlean Cherry, MD as PCP - General (Family Medicine) Thomasene Ripple, DO as PCP - Cardiology (Cardiology)  Extended Emergency Contact Information Primary Emergency Contact: Braaten,Marc Address: [address]          [place], [place] [address] [place] of [place] Home Phone: [phone] Relation: Son  Code Status: DNR Goals of care:  Advanced Directive information Advanced Directives 07/14/2020  Does Patient Have a Medical Advance Directive? Yes  Type of Advance Directive Out of facility DNR (pink MOST or yellow form)  Does patient want to make changes to medical advance directive? No - Patient declined  Would patient like information on creating a medical advance directive? -     Allergies  Allergen Reactions  . Isosorbide Nitrate Other (See Comments)    headache  . Cephalexin Rash  . Gentamicin Dermatitis  . Penicillins Rash  . Sulfa Antibiotics Palpitations    Chief Complaint  Patient presents with  . Discharge Note    Discharge from SNF to home 07/15/2020.    HPI:  86 y.o. female seen today at [facility] for discharge home on 07/15/2020.she was here for short term rehabilitation for post hospital admission at [facility] from 06/24/2020 -07/03/2020 for acute on chronic diastolic Heart Failure EF 65-70% ,Persistent atrial Fibrillation and Right BBB and cat bite on right lower leg.she had been hospitalized at [facility] on 06/11/2020 for cat bite leg but was found to be in rapid atrial fibrillation.she was discharged on diltiazem and EliQuis.she was on furosemide for Lower extremities edema.she had dyspnea with minimal exertion and fatigue. She was diuresed with I.V lasix and had repeat ECHO.she had orthostatic hypotension.Her ARB and Coreg dosage were  decreased.Her HR improved.Cardiology recommended cardioversion when she underwent 07/01/2020 with normal return to NSR.she was resumed her home oral lasix.Her cat bite was treated with 3 days of Augmentin.she was also treated with Nitrofurantoin x 3 days for gross hematuria.Her TSH level was 8.5 and her synthroid was increased to 125 mcg but was discharged on 100 mcg daily due to her recent cardioversion.recheck TSH in 6 weeks. she was discharged to rehab to work with PT/OT and  to follow up with outpatient cardiology Dr. Thomasene Ripple.she has had unremarkable stay here in rehab.     she has worked well with PT/OT now stable for discharge home.She will be discharged home with Home health PT/OT to continue with ROM, Exercise, Gait stability and muscle strengthening.she will also need [facility] RN for medication management.She has a Agricultural consultant and Rollator to allow her to maintain current level of independence with ADL's. Home health services will be arranged by facility social worker prior to discharge. Prescription medication will be written x 1 month then patient to follow up with PCP in 1-2 weeks.She denies any acute issues this visit. Facility staff report no new concerns.   Past Medical History:  Diagnosis Date  . Abnormal perfusion scintigraphy 07/22/2014   Lexiscan MPS Jan 2012 with mild mis and apical anterior ischemia EF 59% she was seen at [facility] MPS Jan 2012 with mild mis and apical anterior ischemia EF 59% she was seen at [facility] of this note might be different from the original. Overview:  Lexiscan MPS Jan 2012 with mild mis and apical anterior ischemia EF  59% [facility] Formatting of this note might be different from  . Anxiety disorder 07/22/2014  . B12 deficiency 07/22/2014  . Benign hypertensive heart and kidney disease with chronic kidney disease 07/22/2014  . CAD (coronary artery disease) 01/02/2020  . Chronic kidney disease, stage 2 (mild) 07/22/2014  . Degeneration,  intervertebral disc, cervical 07/22/2014  . Depression, major, single episode, moderate (HCC) 07/22/2014  . Essential hypertension 09/10/2019  . Former smoker 10/20/2017  . Hypercholesterolemia 07/22/2014  . HYPOTHYROIDISM, POST-RADIATION 06/11/2009   Qualifier: Diagnosis of  By: Everardo AllEllison MD, Cleophas DunkerSean A   . Idiopathic chronic gout of multiple sites without tophus 12/11/2015  . Idiopathic peripheral neuropathy 12/11/2015  . Low back pain 07/22/2014  . Mitral regurgitation 09/10/2019  . Onychomycosis due to dermatophyte 07/22/2014  . Osteoarthritis, generalized 07/22/2014  . Osteoporosis 08/24/2018   DEXA 08/23/2018  Formatting of this note might be different from the original. DEXA 08/23/2018  . Pulmonary hypertension (HCC) 12/08/2017  . PVC (premature ventricular contraction) 04/17/2018  . RBBB 10/20/2017  . Recurrent UTI 12/11/2015  . Sciatica 07/22/2014  . Spinal stenosis of lumbar region 12/11/2015  . TMJ arthralgia 07/22/2014  . Unspecified abnormalities of gait and mobility 07/22/2014  . Urge incontinence of urine 07/22/2014   Wears 8 extra absorbent pads during day an on overnight pad at night  Formatting of this note might be different from the original. Wears 8 extra absorbent pads during day an on overnight pad at night  . Varicose veins 07/22/2014  . Venous stasis dermatitis of both lower extremities 07/22/2014  . Vitamin D deficiency 07/22/2014    Past Surgical History:  Procedure Laterality Date  . BACK SURGERY    . CARDIOVERSION N/A 07/01/2020   Procedure: CARDIOVERSION;  Surgeon: Lewayne Buntingrenshaw, Brian S, MD;  [facility];  Service: Cardiovascular;  Laterality: N/A;  . JOINT REPLACEMENT     hip and knee  . TEE WITHOUT CARDIOVERSION N/A 07/01/2020   Procedure: TRANSESOPHAGEAL ECHOCARDIOGRAM (TEE);  Surgeon: Lewayne Buntingrenshaw, Brian S, MD;  [facility];  Service: Cardiovascular;  Laterality: N/A;      reports that she has never smoked. She has never used smokeless tobacco. She reports  that she does not drink alcohol and does not use drugs. Social History   Socioeconomic History  . Marital status: Widowed    Spouse name: Not on file  . Number of children: Not on file  . Years of education: Not on file  . Highest education level: Not on file  Occupational History  . Not on file  Tobacco Use  . Smoking status: Never Smoker  . Smokeless tobacco: Never Used  Substance and Sexual Activity  . Alcohol use: No  . Drug use: No  . Sexual activity: Not on file  Other Topics Concern  . Not on file  Social History Narrative  . Not on file   Social Determinants of Health   Financial Resource Strain:   . Difficulty of Paying Living Expenses: Not on file  Food Insecurity:   . Worried About Programme researcher, broadcasting/film/videounning Out of Food in the Last Year: Not on file  . Ran Out of Food in the Last Year: Not on file  Transportation Needs:   . Lack of Transportation (Medical): Not on file  . Lack of Transportation (Non-Medical): Not on file  Physical Activity:   . Days of Exercise per Week: Not on file  . Minutes of Exercise per Session: Not on file  Stress:   .  Feeling of Stress : Not on file  Social Connections:   . Frequency of Communication with Friends and Family: Not on file  . Frequency of Social Gatherings with Friends and Family: Not on file  . Attends Religious Services: Not on file  . Active Member of Clubs or Organizations: Not on file  . Attends Banker Meetings: Not on file  . Marital Status: Not on file  Intimate Partner Violence:   . Fear of Current or Ex-Partner: Not on file  . Emotionally Abused: Not on file  . Physically Abused: Not on file  . Sexually Abused: Not on file   Functional Status Survey:    Allergies  Allergen Reactions  . Isosorbide Nitrate Other (See Comments)    headache  . Cephalexin Rash  . Gentamicin Dermatitis  . Penicillins Rash  . Sulfa Antibiotics Palpitations    Pertinent  Health Maintenance Due  Topic Date Due  . DEXA SCAN   Never done  . INFLUENZA VACCINE  Completed  . PNA vac Low Risk Adult  Completed    Medications: Outpatient Encounter Medications as of 07/14/2020  Medication Sig  . acetaminophen (TYLENOL) 500 MG tablet Take 1,000 mg by mouth every 6 (six) hours as needed for mild pain.  Marland Kitchen allopurinol (ZYLOPRIM) 100 MG tablet Take 1 tablet (100 mg total) by mouth daily.  Marland Kitchen atorvastatin (LIPITOR) 10 MG tablet Take 1 tablet (10 mg total) by mouth daily.  . carvedilol (COREG) 3.125 MG tablet Take 1 tablet (3.125 mg total) by mouth 2 (two) times daily with a meal.  . cholecalciferol (VITAMIN D) 1000 UNITS tablet Take 1,000 Units by mouth daily.  . colchicine 0.6 MG tablet Take 1 tablet (0.6 mg total) by mouth daily.  Marland Kitchen diltiazem (CARDIZEM CD) 120 MG 24 hr capsule Take 1 capsule (120 mg total) by mouth daily.  Marland Kitchen ELIQUIS 5 MG TABS tablet Take 1 tablet (5 mg total) by mouth 2 (two) times daily.  . furosemide (LASIX) 20 MG tablet Take 1 tablet (20 mg total) by mouth daily.  . hydrocortisone cream 1 % Apply 1 application topically 2 (two) times daily. To back until rash resolved  . levothyroxine (SYNTHROID) 100 MCG tablet Take 1 tablet (100 mcg total) by mouth daily.  Marland Kitchen telmisartan (MICARDIS) 20 MG tablet Take 1 tablet (20 mg total) by mouth daily.  . vitamin B-12 (CYANOCOBALAMIN) 1000 MCG tablet Take 1,000 mcg by mouth daily.  . [DISCONTINUED] allopurinol (ZYLOPRIM) 100 MG tablet Take 100 mg by mouth daily.  . [DISCONTINUED] atorvastatin (LIPITOR) 10 MG tablet Take 1 tablet (10 mg total) by mouth daily.  . [DISCONTINUED] carvedilol (COREG) 3.125 MG tablet Take 1 tablet (3.125 mg total) by mouth 2 (two) times daily with a meal.  . [DISCONTINUED] colchicine 0.6 MG tablet Take 0.6 mg by mouth daily.  . [DISCONTINUED] diltiazem (CARDIZEM CD) 120 MG 24 hr capsule Take 1 capsule (120 mg total) by mouth daily.  . [DISCONTINUED] ELIQUIS 5 MG TABS tablet Take 5 mg by mouth 2 (two) times daily.  . [DISCONTINUED] furosemide  (LASIX) 20 MG tablet Take 1 tablet (20 mg total) by mouth daily.  . [DISCONTINUED] levothyroxine (SYNTHROID, LEVOTHROID) 100 MCG tablet Take 100 mcg by mouth daily.  . [DISCONTINUED] telmisartan (MICARDIS) 20 MG tablet Take 20 mg by mouth daily.    No facility-administered encounter medications on file as of 07/14/2020.     Review of Systems  Constitutional: Negative for appetite change, chills, fatigue and fever.  HENT: Negative for congestion, rhinorrhea, sinus pressure, sinus pain, sneezing and sore throat.   Eyes: Positive for visual disturbance. Negative for discharge, redness and itching.       Wears eye glasses   Respiratory: Negative for cough, chest tightness, shortness of breath and wheezing.   Cardiovascular: Positive for leg swelling. Negative for chest pain and palpitations.  Gastrointestinal: Negative for abdominal distention, abdominal pain, constipation, diarrhea, nausea and vomiting.  Endocrine: Negative for cold intolerance, heat intolerance, polydipsia, polyphagia and polyuria.  Genitourinary: Negative for difficulty urinating, dysuria, flank pain, frequency and urgency.  Musculoskeletal: Positive for gait problem. Negative for arthralgias, back pain, joint swelling, myalgias and neck pain.  Skin: Negative for color change, pallor and rash.  Neurological: Negative for dizziness, speech difficulty, weakness, light-headedness, numbness and headaches.  Hematological: Does not bruise/bleed easily.  Psychiatric/Behavioral: Negative for agitation, confusion and sleep disturbance. The patient is not nervous/anxious.     Vitals:   07/14/20 1213  BP: 115/61  Pulse: 79  Resp: 18  Temp: 97.8 F (36.6 C)  Weight: 163 lb (73.9 kg)  Height: 5\' 10"  (1.778 m)   Body mass index is 23.39 kg/m.  Physical Exam Vitals and nursing note reviewed.  Constitutional:      General: She is not in acute distress.    Appearance: She is normal weight. She is not ill-appearing.  HENT:      Mouth/Throat:     Mouth: Mucous membranes are moist.     Pharynx: Oropharynx is clear. No oropharyngeal exudate or posterior oropharyngeal erythema.  Eyes:     General: No scleral icterus.       Right eye: No discharge.        Left eye: No discharge.     Extraocular Movements: Extraocular movements intact.     Conjunctiva/sclera: Conjunctivae normal.     Pupils: Pupils are equal, round, and reactive to light.  Neck:     Vascular: No carotid bruit.  Cardiovascular:     Rate and Rhythm: Normal rate and regular rhythm.     Pulses: Normal pulses.     Heart sounds: Normal heart sounds. No murmur heard.  No friction rub. No gallop.   Pulmonary:     Effort: Pulmonary effort is normal. No respiratory distress.     Breath sounds: Normal breath sounds. No wheezing, rhonchi or rales.  Chest:     Chest wall: No tenderness.  Abdominal:     General: Bowel sounds are normal. There is no distension.     Palpations: Abdomen is soft. There is no mass.     Tenderness: There is no abdominal tenderness. There is no right CVA tenderness, left CVA tenderness, guarding or rebound.  Musculoskeletal:        General: No swelling or tenderness.     Cervical back: Normal range of motion. No rigidity or tenderness.     Right lower leg: Edema present.     Left lower leg: Edema present.     Comments: Unsteady gait   Lymphadenopathy:     Cervical: No cervical adenopathy.  Skin:    General: Skin is warm and dry.     Coloration: Skin is not pale.     Findings: No bruising, erythema or rash.  Neurological:     Mental Status: She is alert and oriented to person, place, and time.     Cranial Nerves: No cranial nerve deficit.     Sensory: No sensory deficit.     Motor: No weakness.  Coordination: Coordination normal.     Gait: Gait abnormal.  Psychiatric:        Mood and Affect: Mood normal.        Behavior: Behavior normal.        Thought Content: Thought content normal.        Judgment: Judgment  normal.    Labs reviewed: Basic Metabolic Panel: Recent Labs    06/29/20 0252 06/29/20 0252 06/30/20 0726 06/30/20 0726 07/02/20 0139 07/10/20 0000 07/17/20 1552  NA 137   < > 137   < > 134* 139 137  K 4.8   < > 4.6   < > 5.0 4.8 4.0  CL 96*   < > 97*   < > 98 101 100  CO2 32   < > 32   < > 29 31* 25  GLUCOSE 110*   < > 99  --  87  --  118*  BUN 33*   < > 27*   < > 33* 30* 32*  CREATININE 1.17*   < > 1.02*   < > 0.98 0.9 1.03*  CALCIUM 9.0   < > 9.1   < > 8.7* 9.2 9.0  MG 1.9  --  2.1  --   --   --  1.9   < > = values in this interval not displayed.   Liver Function Tests: Recent Labs    01/03/20 0911  AST 22  ALT 10  ALKPHOS 115  BILITOT 0.4  PROT 7.1  ALBUMIN 3.7    CBC: Recent Labs    06/18/20 1127 06/24/20 1340 06/25/20 0811 06/28/20 0357 06/28/20 0357 06/30/20 0726 07/10/20 0000 07/17/20 1552  WBC 7.8 7.4   < > 8.3   < > 7.4 6.1 6.8  NEUTROABS 5.7 5.6  --   --   --   --   --   --   HGB 11.3 12.3   < > 12.2   < > 13.0 11.8* 12.4  HCT 34.8 35.9*   < > 36.5   < > 39.4 36 37.5  MCV 98* 96.8   < > 96.1  --  97.0  --  99*  PLT 303 289   < > 294   < > 288 215 245   < > = values in this interval not displayed.   Procedures and Imaging Studies During Stay: ECHO TEE  Result Date: 07/01/2020    TRANSESOPHOGEAL ECHO REPORT   Patient Name:   KIEREN RICCI [facility] Date of Exam: 07/01/2020 Medical Rec #:  [id number]      Height:       70.0 in Accession #:    [id number]     Weight:       161.4 lb Date of Birth:  1932/07/24       BSA:          1.905 m Patient Age:    88 years       BP:           85/49 mmHg Patient Gender: F              HR:           66 bpm. Exam Location:  Inpatient Procedure: Transesophageal Echo Indications:     atrial fibrillation 427.31  History:         Patient has prior history of Echocardiogram examinations, most  recent 06/25/2020. CAD; Risk Factors:Hypertension, Dyslipidemia                  and Former Smoker. Pulmonary hypertension.   Sonographer:     Celene Skeen RDCS (AE) Referring Phys:  [id number] HAO MENG Diagnosing Phys: Olga Millers MD PROCEDURE: The transesophogeal probe was passed without difficulty through the esophogus of the patient. Sedation performed by different physician. The patient developed no complications during the procedure. A direct current cardioversion was performed. IMPRESSIONS  1. Left ventricular ejection fraction, by estimation, is 55 to 60%. The left ventricle has normal function.  2. Right ventricular systolic function is normal. The right ventricular size is normal.  3. Left atrial size was moderately dilated. No left atrial/left atrial appendage thrombus was detected.  4. The mitral valve is abnormal. Moderate mitral valve regurgitation. There is moderate prolapse of both leaflets of the mitral valve.  5. Tricuspid valve regurgitation is mild to moderate.  6. The aortic valve is tricuspid. Aortic valve regurgitation is mild. Mild aortic valve sclerosis is present, with no evidence of aortic valve stenosis.  7. There is Moderate (Grade III) plaque involving the descending aorta. FINDINGS  Left Ventricle: Left ventricular ejection fraction, by estimation, is 55 to 60%. The left ventricle has normal function. The left ventricular internal cavity size was normal in size. Right Ventricle: The right ventricular size is normal. Right vetricular wall thickness was not assessed. Right ventricular systolic function is normal. Left Atrium: Left atrial size was moderately dilated. No left atrial/left atrial appendage thrombus was detected. Right Atrium: Right atrial size was normal in size. Pericardium: There is no evidence of pericardial effusion. Mitral Valve: The mitral valve is abnormal. There is moderate prolapse of both leaflets of the mitral valve. Moderate mitral valve regurgitation. Tricuspid Valve: The tricuspid valve is normal in structure. Tricuspid valve regurgitation is mild to moderate. Aortic Valve: The  aortic valve is tricuspid. Aortic valve regurgitation is mild. Mild aortic valve sclerosis is present, with no evidence of aortic valve stenosis. Pulmonic Valve: The pulmonic valve was normal in structure. Pulmonic valve regurgitation is mild. Aorta: The aortic root is normal in size and structure. There is moderate (Grade III) plaque involving the descending aorta. IAS/Shunts: No atrial level shunt detected by color flow Doppler. Olga Millers MD Electronically signed by Olga Millers MD Signature Date/Time: 07/01/2020/1:02:59 PM    Final (Updated)    ECHO TEE  Result Date: 07/01/2020    TRANSESOPHOGEAL ECHO REPORT   Patient Name:   CATHRYNE MANCEBO [facility] Date of Exam: 07/01/2020 Medical Rec #:  [id number]      Height:       70.0 in Accession #:    [id number]     Weight:       161.4 lb Date of Birth:  1931-10-27       BSA:          1.905 m Patient Age:    88 years       BP:           85/49 mmHg Patient Gender: F              HR:           66 bpm. Exam Location:  Inpatient Procedure: Transesophageal Echo Indications:    atrial fibrillation 427.31  History:        Patient has prior history of Echocardiogram examinations, most  recent 06/25/2020. CAD; Risk Factors:Hypertension, Dyslipidemia                 and Former Smoker. Pulmonary hypertension.  Sonographer:    Celene Skeen RDCS (AE) Referring Phys: [id number] HAO MENG PROCEDURE: The transesophogeal probe was passed without difficulty through the esophogus of the patient. Sedation performed by different physician. The patient developed no complications during the procedure. A direct current cardioversion was performed. IMPRESSIONS  1. Left ventricular ejection fraction, by estimation, is 55 to 60%. The left ventricle has normal function.  2. Right ventricular systolic function is normal. The right ventricular size is normal.  3. Left atrial size was moderately dilated. No left atrial/left atrial appendage thrombus was detected.  4. The mitral valve is  abnormal. Moderate mitral valve regurgitation. There is moderate prolapse of both leaflets of the mitral valve.  5. Tricuspid valve regurgitation is mild to moderate.  6. The aortic valve is tricuspid. Aortic valve regurgitation is mild. Mild aortic valve sclerosis is present, with no evidence of aortic valve stenosis.  7. There is Moderate (Grade III) plaque involving the descending aorta. FINDINGS  Left Ventricle: Left ventricular ejection fraction, by estimation, is 55 to 60%. The left ventricle has normal function. The left ventricular internal cavity size was normal in size. Right Ventricle: The right ventricular size is normal. Right vetricular wall thickness was not assessed. Right ventricular systolic function is normal. Left Atrium: Left atrial size was moderately dilated. No left atrial/left atrial appendage thrombus was detected. Right Atrium: Right atrial size was normal in size. Pericardium: There is no evidence of pericardial effusion. Mitral Valve: The mitral valve is abnormal. There is moderate prolapse of both leaflets of the mitral valve. Moderate mitral valve regurgitation. Tricuspid Valve: The tricuspid valve is normal in structure. Tricuspid valve regurgitation is mild to moderate. Aortic Valve: The aortic valve is tricuspid. Aortic valve regurgitation is mild. Mild aortic valve sclerosis is present, with no evidence of aortic valve stenosis. Pulmonic Valve: The pulmonic valve was normal in structure. Pulmonic valve regurgitation is mild. Aorta: The aortic root is normal in size and structure. There is moderate (Grade III) plaque involving the descending aorta. IAS/Shunts: No atrial level shunt detected by color flow Doppler. Olga Millers MD Electronically signed by Olga Millers MD Signature Date/Time: 07/01/2020/1:02:59 PM    Final     Assessment/Plan:     1. Chronic diastolic (congestive) heart failure (HCC) Status post hospital admission as above.symptoms have improved. No abrupt  weight gain.lungs CTA bilateral. Continue on coreg,diltiazem and Furosemide as below.  - carvedilol (COREG) 3.125 MG tablet; Take 1 tablet (3.125 mg total) by mouth 2 (two) times daily with a meal.  Dispense: 60 tablet; Refill: 0 - diltiazem (CARDIZEM CD) 120 MG 24 hr capsule; Take 1 capsule (120 mg total) by mouth daily.  Dispense: 30 capsule; Refill: 0 - furosemide (LASIX) 20 MG tablet; Take 1 tablet (20 mg total) by mouth daily.  Dispense: 30 tablet; Refill: 0 -  HH RN for medication management   2. Essential hypertension Blood pressure well controlled. Continue on current medication.  - carvedilol (COREG) 3.125 MG tablet; Take 1 tablet (3.125 mg total) by mouth 2 (two) times daily with a meal.  Dispense: 60 tablet; Refill: 0 - diltiazem (CARDIZEM CD) 120 MG 24 hr capsule; Take 1 capsule (120 mg total) by mouth daily.  Dispense: 30 capsule; Refill: 0 - furosemide (LASIX) 20 MG tablet; Take 1 tablet (20 mg total) by mouth daily.  Dispense:  30 tablet; Refill: 0 - telmisartan (MICARDIS) 20 MG tablet; Take 1 tablet (20 mg total) by mouth daily.  Dispense: 30 tablet; Refill: 0  - CBC, BMP in 1-2 weeks PCP   3. Acquired hypothyroidism TSH level was elevated  8.5 and her synthroid was increased to 125 mcg but was discharged on 100 mcg daily due to her recent cardioversion.recheck TSH in 6 weeks. - levothyroxine (SYNTHROID) 100 MCG tablet; Take 1 tablet (100 mcg total) by mouth daily.  Dispense: 30 tablet; Refill: 0  4. Spinal stenosis of lumbar region with neurogenic claudication Pain controlled. Continue on Tylenol   5. Idiopathic chronic gout of multiple sites without tophus Symptoms controlled.Continue on allopurinol and Colchicine.  - allopurinol (ZYLOPRIM) 100 MG tablet; Take 1 tablet (100 mg total) by mouth daily.  Dispense: 30 tablet; Refill: 0 - colchicine 0.6 MG tablet; Take 1 tablet (0.6 mg total) by mouth daily.  Dispense: 30 tablet; Refill: 0  6. Age related osteoporosis,  unspecified pathological fracture presence Continue on vitamin D  7. Cat bite of right lower leg, sequela Resolved.   8. Persistent atrial fibrillation (HCC) Status post cardioversion as above. Continue on Diltiazem,coreg and Eliquis  - ELIQUIS 5 MG TABS tablet; Take 1 tablet (5 mg total) by mouth 2 (two) times daily.  Dispense: 60 tablet; Refill: 0 - continue to follow up with Cardiologist Dr. Thomasene Ripple - [facility] RN for medication management   9. Hypercholesterolemia No latest LDL for review.will defer to PCP. Continue on Atorvastatin. - atorvastatin (LIPITOR) 10 MG tablet; Take 1 tablet (10 mg total) by mouth daily.  Dispense: 30 tablet; Refill: 0   10 .Unsteady gait  Has worked well with PT/ OT. She will discharge home with PT/OT to continue with ROM, Exercise, Gait stability and muscle strengthening.Has DME Rollator and Rolling walker at home  to allow her to maintain current level of independence with ADL's. Fall and safety precautions.   Patient is being discharged with the following home health services:   -PT/OT for ROM, exercise, gait stability and muscle strengthening  -  HH RN for medication management    Patient is being discharged with the following durable medical equipment:   None has own Rollator and Rolling walker.    Patient has been advised to f/u with their PCP in 1-2 weeks to for a transitions of care visit.Social services at their facility was responsible for arranging this appointment.  Pt was provided with adequate prescriptions of noncontrolled medications to reach the scheduled appointment.For controlled substances, a limited supply was provided as appropriate for the individual patient. If the pt normally receives these medications from a pain clinic or has a contract with another physician, these medications should be received from that clinic or physician only).    Future labs/tests needed:  CBC, BMP in 1-2 weeks PCP TSH in 4 weeks.

## 2020-07-14 NOTE — Patient Instructions (Signed)
SNF follow up and CBC,BMP with PCP in 1-2 weeks.TSH in 6 weeks ( 08/15/2020)

## 2020-07-17 ENCOUNTER — Ambulatory Visit: Payer: Medicare PPO | Admitting: Cardiology

## 2020-07-17 ENCOUNTER — Encounter: Payer: Self-pay | Admitting: Cardiology

## 2020-07-17 ENCOUNTER — Other Ambulatory Visit: Payer: Self-pay

## 2020-07-17 VITALS — BP 116/60 | HR 72 | Ht 70.0 in | Wt 166.0 lb

## 2020-07-17 DIAGNOSIS — I10 Essential (primary) hypertension: Secondary | ICD-10-CM

## 2020-07-17 DIAGNOSIS — I493 Ventricular premature depolarization: Secondary | ICD-10-CM | POA: Diagnosis not present

## 2020-07-17 DIAGNOSIS — I5032 Chronic diastolic (congestive) heart failure: Secondary | ICD-10-CM | POA: Diagnosis not present

## 2020-07-17 DIAGNOSIS — I480 Paroxysmal atrial fibrillation: Secondary | ICD-10-CM | POA: Diagnosis not present

## 2020-07-17 NOTE — Patient Instructions (Signed)
Medication Instructions:  Your physician recommends that you continue on your current medications as directed. Please refer to the Current Medication list given to you today.  *If you need a refill on your cardiac medications before your next appointment, please call your pharmacy*   Lab Work: Your physician recommends that you return for lab work in: TODAY BMP, CBC, Mag If you have labs (blood work) drawn today and your tests are completely normal, you will receive your results only by: Marland Kitchen MyChart Message (if you have MyChart) OR . A paper copy in the mail If you have any lab test that is abnormal or we need to change your treatment, we will call you to review the results.   Testing/Procedures: None   Follow-Up: At [facility], you and your health needs are our priority.  As part of our continuing mission to provide you with exceptional heart care, we have created designated Provider Care Teams.  These Care Teams include your primary Cardiologist (physician) and Advanced Practice Providers (APPs -  Physician Assistants and Nurse Practitioners) who all work together to provide you with the care you need, when you need it.  We recommend signing up for the patient portal called "MyChart".  Sign up information is provided on this After Visit Summary.  MyChart is used to connect with patients for Virtual Visits (Telemedicine).  Patients are able to view lab/test results, encounter notes, upcoming appointments, etc.  Non-urgent messages can be sent to your provider as well.   To learn more about what you can do with MyChart, go to [email or web address].    Your next appointment:   8 week(s)  The format for your next appointment:   In Person  Provider:   Thomasene Ripple, DO   Other Instructions

## 2020-07-17 NOTE — Progress Notes (Signed)
BMP

## 2020-07-18 DIAGNOSIS — I10 Essential (primary) hypertension: Secondary | ICD-10-CM

## 2020-07-18 DIAGNOSIS — I5032 Chronic diastolic (congestive) heart failure: Secondary | ICD-10-CM | POA: Insufficient documentation

## 2020-07-18 HISTORY — DX: Essential (primary) hypertension: I10

## 2020-07-18 LAB — BASIC METABOLIC PANEL
BUN/Creatinine Ratio: 31 — ABNORMAL HIGH (ref 12–28)
BUN: 32 mg/dL — ABNORMAL HIGH (ref 8–27)
CO2: 25 mmol/L (ref 20–29)
Calcium: 9 mg/dL (ref 8.7–10.3)
Chloride: 100 mmol/L (ref 96–106)
Creatinine, Ser: 1.03 mg/dL — ABNORMAL HIGH (ref 0.57–1.00)
GFR calc Af Amer: 57 mL/min/{1.73_m2} — ABNORMAL LOW (ref >59)
GFR calc non Af Amer: 49 mL/min/{1.73_m2} — ABNORMAL LOW (ref >59)
Glucose: 118 mg/dL — ABNORMAL HIGH (ref 65–99)
Potassium: 4 mmol/L (ref 3.5–5.2)
Sodium: 137 mmol/L (ref 134–144)

## 2020-07-18 LAB — MAGNESIUM: Magnesium: 1.9 mg/dL (ref 1.6–2.3)

## 2020-07-18 LAB — CBC
Hematocrit: 37.5 % (ref 34.0–46.6)
Hemoglobin: 12.4 g/dL (ref 11.1–15.9)
MCH: 32.6 pg (ref 26.6–33.0)
MCHC: 33.1 g/dL (ref 31.5–35.7)
MCV: 99 fL — ABNORMAL HIGH (ref 79–97)
Platelets: 245 10*3/uL (ref 150–450)
RBC: 3.8 x10E6/uL (ref 3.77–5.28)
RDW: 12.5 % (ref 11.7–15.4)
WBC: 6.8 10*3/uL (ref 3.4–10.8)

## 2020-07-18 NOTE — Progress Notes (Signed)
Cardiology Office Note:    Date:  07/18/2020   ID:  [name], DOB 01-05-1932, MRN [id number]  PCP:  Everlean Cherry, MD  Cardiologist:  Thomasene Ripple, DO  Electrophysiologist:  None   Referring MD: Everlean Cherry, MD   " I am doing much better"  History of Present Illness:   Patient is a 86 year old female with history of coronary artery disease, chronic diastolic heart failure with EF 65%, grade 2 diastolic dysfunction, persistent A. fib on Eliquis, right bundle branch block, hypertension.   At our last visit the patient has significant bilateral leg edema I recommend that she go to the emergency department.  In the emergency department for admission which will include IV diuretics and TEE cardioversion.  The patient was admitted at the [facility] and and discharged to skilled nursing facility.  During the hospital stay she was diuresed however developed some complication with orthostatic hypotension.  Her medications were altered.  She also underwent TEE cardioversion and was converted to sinus rhythm. She recently got back from skilled nursing facility and tells me she is doing a lot better.  She offers no complaints today in the office.  Past Medical History:  Diagnosis Date  . Abnormal perfusion scintigraphy 07/22/2014   Lexiscan MPS Jan 2012 with mild mis and apical anterior ischemia EF 59% she was seen at [facility] MPS Jan 2012 with mild mis and apical anterior ischemia EF 59% she was seen at [facility] of this note might be different from the original. Overview:  Lexiscan MPS Jan 2012 with mild mis and apical anterior ischemia EF 59% she was seen at [facility] of this note might be different from  . Anxiety disorder 07/22/2014  . B12 deficiency 07/22/2014  . Benign hypertensive heart and kidney disease with chronic kidney disease 07/22/2014  . CAD (coronary artery disease) 01/02/2020  . Chronic kidney disease, stage 2 (mild) 07/22/2014  . Degeneration,  intervertebral disc, cervical 07/22/2014  . Depression, major, single episode, moderate (HCC) 07/22/2014  . Essential hypertension 09/10/2019  . Former smoker 10/20/2017  . Hypercholesterolemia 07/22/2014  . HYPOTHYROIDISM, POST-RADIATION 06/11/2009   Qualifier: Diagnosis of  By: Everardo All MD, Cleophas Dunker   . Idiopathic chronic gout of multiple sites without tophus 12/11/2015  . Idiopathic peripheral neuropathy 12/11/2015  . Low back pain 07/22/2014  . Mitral regurgitation 09/10/2019  . Onychomycosis due to dermatophyte 07/22/2014  . Osteoarthritis, generalized 07/22/2014  . Osteoporosis 08/24/2018   DEXA 08/23/2018  Formatting of this note might be different from the original. DEXA 08/23/2018  . Pulmonary hypertension (HCC) 12/08/2017  . PVC (premature ventricular contraction) 04/17/2018  . RBBB 10/20/2017  . Recurrent UTI 12/11/2015  . Sciatica 07/22/2014  . Spinal stenosis of lumbar region 12/11/2015  . TMJ arthralgia 07/22/2014  . Unspecified abnormalities of gait and mobility 07/22/2014  . Urge incontinence of urine 07/22/2014   Wears 8 extra absorbent pads during day an on overnight pad at night  Formatting of this note might be different from the original. Wears 8 extra absorbent pads during day an on overnight pad at night  . Varicose veins 07/22/2014  . Venous stasis dermatitis of both lower extremities 07/22/2014  . Vitamin D deficiency 07/22/2014    Past Surgical History:  Procedure Laterality Date  . BACK SURGERY    . CARDIOVERSION N/A 07/01/2020   Procedure: CARDIOVERSION;  Surgeon: Lewayne Bunting, MD;  Location: [facility] ENDOSCOPY;  Service: Cardiovascular;  Laterality: N/A;  .  JOINT REPLACEMENT     hip and knee  . TEE WITHOUT CARDIOVERSION N/A 07/01/2020   Procedure: TRANSESOPHAGEAL ECHOCARDIOGRAM (TEE);  Surgeon: Lewayne Bunting, MD;  Location: [facility] ENDOSCOPY;  Service: Cardiovascular;  Laterality: N/A;    Current Medications: Current Meds  Medication Sig  . acetaminophen (TYLENOL) 500 MG  tablet Take 1,000 mg by mouth every 6 (six) hours as needed for mild pain.  Marland Kitchen allopurinol (ZYLOPRIM) 100 MG tablet Take 1 tablet (100 mg total) by mouth daily.  Marland Kitchen atorvastatin (LIPITOR) 10 MG tablet Take 1 tablet (10 mg total) by mouth daily.  . carvedilol (COREG) 3.125 MG tablet Take 1 tablet (3.125 mg total) by mouth 2 (two) times daily with a meal.  . cholecalciferol (VITAMIN D) 1000 UNITS tablet Take 1,000 Units by mouth daily.  . colchicine 0.6 MG tablet Take 1 tablet (0.6 mg total) by mouth daily.  Marland Kitchen diltiazem (CARDIZEM CD) 120 MG 24 hr capsule Take 1 capsule (120 mg total) by mouth daily.  Marland Kitchen ELIQUIS 5 MG TABS tablet Take 1 tablet (5 mg total) by mouth 2 (two) times daily.  . furosemide (LASIX) 20 MG tablet Take 1 tablet (20 mg total) by mouth daily.  . hydrocortisone cream 1 % Apply 1 application topically 2 (two) times daily. To back until rash resolved  . levothyroxine (SYNTHROID) 100 MCG tablet Take 1 tablet (100 mcg total) by mouth daily.  Marland Kitchen telmisartan (MICARDIS) 20 MG tablet Take 1 tablet (20 mg total) by mouth daily.  . vitamin B-12 (CYANOCOBALAMIN) 1000 MCG tablet Take 1,000 mcg by mouth daily.     Allergies:   Isosorbide nitrate, Cephalexin, Gentamicin, Penicillins, and Sulfa antibiotics   Social History   Socioeconomic History  . Marital status: Widowed    Spouse name: Not on file  . Number of children: Not on file  . Years of education: Not on file  . Highest education level: Not on file  Occupational History  . Not on file  Tobacco Use  . Smoking status: Never Smoker  . Smokeless tobacco: Never Used  Substance and Sexual Activity  . Alcohol use: No  . Drug use: No  . Sexual activity: Not on file  Other Topics Concern  . Not on file  Social History Narrative  . Not on file   Social Determinants of Health   Financial Resource Strain:   . Difficulty of Paying Living Expenses: Not on file  Food Insecurity:   . Worried About Programme researcher, broadcasting/film/video in the Last  Year: Not on file  . Ran Out of Food in the Last Year: Not on file  Transportation Needs:   . Lack of Transportation (Medical): Not on file  . Lack of Transportation (Non-Medical): Not on file  Physical Activity:   . Days of Exercise per Week: Not on file  . Minutes of Exercise per Session: Not on file  Stress:   . Feeling of Stress : Not on file  Social Connections:   . Frequency of Communication with Friends and Family: Not on file  . Frequency of Social Gatherings with Friends and Family: Not on file  . Attends Religious Services: Not on file  . Active Member of Clubs or Organizations: Not on file  . Attends Banker Meetings: Not on file  . Marital Status: Not on file     Family History: The patient's family history includes Cancer in her mother; Emphysema in her brother; Heart attack in her brother; Heart disease in  her brother; Hypertension in her mother; Prostate cancer in her father.  ROS:   Review of Systems  Constitution: Negative for decreased appetite, fever and weight gain.  HENT: Negative for congestion, ear discharge, hoarse voice and sore throat.   Eyes: Negative for discharge, redness, vision loss in right eye and visual halos.  Cardiovascular: Negative for chest pain, dyspnea on exertion, leg swelling, orthopnea and palpitations.  Respiratory: Negative for cough, hemoptysis, shortness of breath and snoring.   Endocrine: Negative for heat intolerance and polyphagia.  Hematologic/Lymphatic: Negative for bleeding problem. Does not bruise/bleed easily.  Skin: Negative for flushing, nail changes, rash and suspicious lesions.  Musculoskeletal: Negative for arthritis, joint pain, muscle cramps, myalgias, neck pain and stiffness.  Gastrointestinal: Negative for abdominal pain, bowel incontinence, diarrhea and excessive appetite.  Genitourinary: Negative for decreased libido, genital sores and incomplete emptying.  Neurological: Negative for brief paralysis,  focal weakness, headaches and loss of balance.  Psychiatric/Behavioral: Negative for altered mental status, depression and suicidal ideas.  Allergic/Immunologic: Negative for HIV exposure and persistent infections.    EKGs/Labs/Other Studies Reviewed:    The following studies were reviewed today:   EKG: None today  Transthoracic echocardiogramIMPRESSIONS    1. Inferior basal hypokinesis. Left ventricular ejection fraction, by  estimation, is 55%. The left ventricle has normal function. The left  ventricle has no regional wall motion abnormalities. Left ventricular  diastolic parameters are indeterminate.  2. Right ventricular systolic function is normal. The right ventricular  size is normal. There is normal pulmonary artery systolic pressure.  3. Left atrial size was moderately dilated.  4. Right atrial size was mildly dilated.  5. The mitral valve is normal in structure. Mild mitral valve  regurgitation. No evidence of mitral stenosis.  6. The aortic valve is normal in structure. Aortic valve regurgitation is  not visualized. No aortic stenosis is present.  7. The inferior vena cava is dilated in size with >50% respiratory  variability, suggesting right atrial pressure of 8 mmHg.   FINDINGS  Left Ventricle: Inferior basal hypokinesis. Left ventricular ejection  fraction, by estimation, is 55%. The left ventricle has normal function.  The left ventricle has no regional wall motion abnormalities. The left  ventricular internal cavity size was  normal in size. There is no left ventricular hypertrophy. Left ventricular  diastolic parameters are indeterminate.   Right Ventricle: The right ventricular size is normal. No increase in  right ventricular wall thickness. Right ventricular systolic function is  normal. There is normal pulmonary artery systolic pressure. The tricuspid  regurgitant velocity is 2.58 m/s, and  with an assumed right atrial pressure of 8 mmHg, the  estimated right  ventricular systolic pressure is 34.6 mmHg.   Left Atrium: Left atrial size was moderately dilated.   Right Atrium: Right atrial size was mildly dilated.   Pericardium: There is no evidence of pericardial effusion.   Mitral Valve: The mitral valve is normal in structure. Mild mitral valve  regurgitation. No evidence of mitral valve stenosis.   Tricuspid Valve: The tricuspid valve is normal in structure. Tricuspid  valve regurgitation is mild . No evidence of tricuspid stenosis.   Aortic Valve: The aortic valve is normal in structure. Aortic valve  regurgitation is not visualized. No aortic stenosis is present.   Pulmonic Valve: The pulmonic valve was normal in structure. Pulmonic valve  regurgitation is not visualized. No evidence of pulmonic stenosis.   Aorta: The aortic root is normal in size and structure.   Venous:  The inferior vena cava is dilated in size with greater than 50%  respiratory variability, suggesting right atrial pressure of 8 mmHg.   IAS/Shunts: No atrial level shunt detected by color flow Doppler.   Recent Labs: 01/03/2020: ALT 10 06/24/2020: B Natriuretic Peptide 346.3 06/25/2020: TSH 8.527 07/17/2020: BUN 32; Creatinine, Ser 1.03; Hemoglobin 12.4; Magnesium 1.9; Platelets 245; Potassium 4.0; Sodium 137  Recent Lipid Panel    Component Value Date/Time   CHOL 180 01/03/2020 0911   TRIG 57 01/03/2020 0911   HDL 74 01/03/2020 0911   CHOLHDL 2.4 01/03/2020 0911   LDLCALC 95 01/03/2020 0911    Physical Exam:    VS:  BP 116/60   Pulse 72   Ht 5\' 10"  (1.778 m)   Wt 166 lb (75.3 kg)   SpO2 98%   BMI 23.82 kg/m     Wt Readings from Last 3 Encounters:  07/17/20 166 lb (75.3 kg)  07/14/20 163 lb (73.9 kg)  07/04/20 163 lb 11.2 oz (74.3 kg)     GEN: Well nourished, well developed in no acute distress HEENT: Normal NECK: No JVD; No carotid bruits LYMPHATICS: No lymphadenopathy CARDIAC: S1S2 noted,RRR, no murmurs, rubs,  gallops RESPIRATORY:  Clear to auscultation without rales, wheezing or rhonchi  ABDOMEN: Soft, non-tender, non-distended, +bowel sounds, no guarding. EXTREMITIES: No edema, No cyanosis, no clubbing MUSCULOSKELETAL:  No deformity  SKIN: Warm and dry NEUROLOGIC:  Alert and oriented x 3, non-focal PSYCHIATRIC:  Normal affect, good insight  ASSESSMENT:    1. PVC (premature ventricular contraction)   2. PAF (paroxysmal atrial fibrillation) (HCC)   3. Chronic diastolic heart failure (HCC)   4. Primary hypertension    PLAN:     She is doing well from a cardiovascular standpoint. She tells me that she is fatigue but mostly feels close her baseline.  We will continue her current medications.  Blood work will be done today.   The patient is in agreement with the above plan. The patient left the office in stable condition.  The patient will follow up in 8 weeks or sooner if needed.   Medication Adjustments/Labs and Tests Ordered: Current medicines are reviewed at length with the patient today.  Concerns regarding medicines are outlined above.  Orders Placed This Encounter  Procedures  . Basic metabolic panel  . CBC  . Magnesium   No orders of the defined types were placed in this encounter.   Patient Instructions  Medication Instructions:  Your physician recommends that you continue on your current medications as directed. Please refer to the Current Medication list given to you today.  *If you need a refill on your cardiac medications before your next appointment, please call your pharmacy*   Lab Work: Your physician recommends that you return for lab work in: TODAY BMP, CBC, Mag If you have labs (blood work) drawn today and your tests are completely normal, you will receive your results only by: Marland Kitchen. MyChart Message (if you have MyChart) OR . A paper copy in the mail If you have any lab test that is abnormal or we need to change your treatment, we will call you to review the  results.   Testing/Procedures: None   Follow-Up: At [facility], you and your health needs are our priority.  As part of our continuing mission to provide you with exceptional heart care, we have created designated Provider Care Teams.  These Care Teams include your primary Cardiologist (physician) and Advanced Practice Providers (APPs -  Physician  Assistants and Nurse Practitioners) who all work together to provide you with the care you need, when you need it.  We recommend signing up for the patient portal called "MyChart".  Sign up information is provided on this After Visit Summary.  MyChart is used to connect with patients for Virtual Visits (Telemedicine).  Patients are able to view lab/test results, encounter notes, upcoming appointments, etc.  Non-urgent messages can be sent to your provider as well.   To learn more about what you can do with MyChart, go to [email or web address].    Your next appointment:   8 week(s)  The format for your next appointment:   In Person  Provider:   Thomasene Ripple, DO   Other Instructions      Adopting a Healthy Lifestyle.  Know what a healthy weight is for you (roughly BMI <25) and aim to maintain this   Aim for 7+ servings of fruits and vegetables daily   65-80+ fluid ounces of water or unsweet tea for healthy kidneys   Limit to max 1 drink of alcohol per day; avoid smoking/tobacco   Limit animal fats in diet for cholesterol and heart health - choose grass fed whenever available   Avoid highly processed foods, and foods high in saturated/trans fats   Aim for low stress - take time to unwind and care for your mental health   Aim for 150 min of moderate intensity exercise weekly for heart health, and weights twice weekly for bone health   Aim for 7-9 hours of sleep daily   When it comes to diets, agreement about the perfect plan isnt easy to find, even among the experts. Experts at the [facility] [employer] developed  an idea known as the Healthy Eating Plate. Just imagine a plate divided into logical, healthy portions.   The emphasis is on diet quality:   Load up on vegetables and fruits - one-half of your plate: Aim for color and variety, and remember that potatoes dont count.   Go for whole grains - one-quarter of your plate: Whole wheat, barley, wheat berries, quinoa, oats, brown rice, and foods made with them. If you want pasta, go with whole wheat pasta.   Protein power - one-quarter of your plate: Fish, chicken, beans, and nuts are all healthy, versatile protein sources. Limit red meat.   The diet, however, does go beyond the plate, offering a few other suggestions.   Use healthy plant oils, such as olive, canola, soy, corn, sunflower and peanut. Check the labels, and avoid partially hydrogenated oil, which have unhealthy trans fats.   If youre thirsty, drink water. Coffee and tea are good in moderation, but skip sugary drinks and limit milk and dairy products to one or two daily servings.   The type of carbohydrate in the diet is more important than the amount. Some sources of carbohydrates, such as vegetables, fruits, whole grains, and beans-are healthier than others.   Finally, stay active  Signed, Thomasene Ripple, DO  07/18/2020 7:14 PM    [facility]

## 2020-07-21 ENCOUNTER — Ambulatory Visit: Payer: Medicare PPO | Admitting: Cardiology

## 2020-07-29 DIAGNOSIS — R29898 Other symptoms and signs involving the musculoskeletal system: Secondary | ICD-10-CM

## 2020-07-29 DIAGNOSIS — N1831 Chronic kidney disease, stage 3a: Secondary | ICD-10-CM

## 2020-07-29 DIAGNOSIS — Z96659 Presence of unspecified artificial knee joint: Secondary | ICD-10-CM

## 2020-07-29 DIAGNOSIS — Z8679 Personal history of other diseases of the circulatory system: Secondary | ICD-10-CM

## 2020-07-29 HISTORY — DX: Chronic kidney disease, stage 3a: N18.31

## 2020-07-29 HISTORY — DX: Presence of unspecified artificial knee joint: Z96.659

## 2020-07-29 HISTORY — DX: Other symptoms and signs involving the musculoskeletal system: R29.898

## 2020-07-29 HISTORY — DX: Personal history of other diseases of the circulatory system: Z86.79

## 2020-08-18 ENCOUNTER — Other Ambulatory Visit: Payer: Self-pay | Admitting: Cardiology

## 2020-08-18 DIAGNOSIS — I5032 Chronic diastolic (congestive) heart failure: Secondary | ICD-10-CM

## 2020-08-18 DIAGNOSIS — I10 Essential (primary) hypertension: Secondary | ICD-10-CM

## 2020-08-18 MED ORDER — DILTIAZEM HCL ER COATED BEADS 120 MG PO CP24: 120.0000 mg | ORAL_CAPSULE | Freq: Every day | ORAL | 3 refills | Status: DC

## 2020-08-18 MED ORDER — CARVEDILOL 3.125 MG PO TABS: 3.1250 mg | ORAL_TABLET | Freq: Two times a day (BID) | ORAL | 3 refills | Status: DC

## 2020-08-18 NOTE — Telephone Encounter (Signed)
Refill sent in per request.  

## 2020-08-18 NOTE — Telephone Encounter (Signed)
*  STAT* If patient is at the pharmacy, call can be transferred to refill team.   1. Which medications need to be refilled? (please list name of each medication and dose if known) carvedilol (COREG) 3.125 MG tablet  diltiazem (CARDIZEM CD) 120 MG 24 hr capsule(Expired)   2. Which pharmacy/location (including street and city if local pharmacy) is medication to be sent to? [facility] - [place], [place] - [address]  3. Do they need a 30 day or 90 day supply?  90 day if needed   Patient states that Dr. Servando Salina said to continue these medications when the patient saw her in October - The prescriptions have expired and she is completely out of medication. Patient wants to know if this is something Dr. Servando Salina can write a new prescription for if she needs to keep taking it. If unable please call/advise

## 2020-09-10 ENCOUNTER — Other Ambulatory Visit: Payer: Self-pay

## 2020-09-11 ENCOUNTER — Ambulatory Visit: Payer: Medicare PPO | Admitting: Cardiology

## 2020-09-11 ENCOUNTER — Encounter: Payer: Self-pay | Admitting: Cardiology

## 2020-09-11 ENCOUNTER — Other Ambulatory Visit: Payer: Self-pay

## 2020-09-11 VITALS — BP 114/70 | HR 60 | Ht 70.0 in | Wt 168.0 lb

## 2020-09-11 DIAGNOSIS — I272 Pulmonary hypertension, unspecified: Secondary | ICD-10-CM

## 2020-09-11 DIAGNOSIS — I10 Essential (primary) hypertension: Secondary | ICD-10-CM

## 2020-09-11 DIAGNOSIS — I5032 Chronic diastolic (congestive) heart failure: Secondary | ICD-10-CM

## 2020-09-11 DIAGNOSIS — I051 Rheumatic mitral insufficiency: Secondary | ICD-10-CM | POA: Diagnosis not present

## 2020-09-11 DIAGNOSIS — I480 Paroxysmal atrial fibrillation: Secondary | ICD-10-CM

## 2020-09-11 DIAGNOSIS — Z8679 Personal history of other diseases of the circulatory system: Secondary | ICD-10-CM

## 2020-09-11 DIAGNOSIS — I4819 Other persistent atrial fibrillation: Secondary | ICD-10-CM

## 2020-09-11 DIAGNOSIS — I130 Hypertensive heart and chronic kidney disease with heart failure and stage 1 through stage 4 chronic kidney disease, or unspecified chronic kidney disease: Secondary | ICD-10-CM | POA: Diagnosis not present

## 2020-09-11 DIAGNOSIS — E7800 Pure hypercholesterolemia, unspecified: Secondary | ICD-10-CM

## 2020-09-11 MED ORDER — FUROSEMIDE 20 MG PO TABS: 20.0000 mg | ORAL_TABLET | Freq: Every day | ORAL | 3 refills | Status: DC

## 2020-09-11 MED ORDER — TELMISARTAN 20 MG PO TABS: 20.0000 mg | ORAL_TABLET | Freq: Every day | ORAL | 3 refills | Status: DC

## 2020-09-11 MED ORDER — DILTIAZEM HCL ER COATED BEADS 120 MG PO CP24: 120.0000 mg | ORAL_CAPSULE | Freq: Every day | ORAL | 3 refills | Status: DC

## 2020-09-11 MED ORDER — ELIQUIS 5 MG PO TABS: 5.0000 mg | ORAL_TABLET | Freq: Two times a day (BID) | ORAL | 3 refills | Status: DC

## 2020-09-11 MED ORDER — CARVEDILOL 3.125 MG PO TABS: 3.1250 mg | ORAL_TABLET | Freq: Two times a day (BID) | ORAL | 3 refills | Status: DC

## 2020-09-11 MED ORDER — ATORVASTATIN CALCIUM 10 MG PO TABS: 10.0000 mg | ORAL_TABLET | Freq: Every day | ORAL | 3 refills | Status: DC

## 2020-09-11 NOTE — Progress Notes (Signed)
Cardiology Office Note:    Date:  09/11/2020   ID:  [name], DOB 1931/11/04, MRN [id number]  PCP:  Victoria Cherry, MD  Cardiologist:  Thomasene Ripple, DO  Electrophysiologist:  None   Referring MD: Victoria Cherry, MD   " I am so   History of Present Illness:    [name] is a 86 y.o. female with a hx of of coronary artery disease, chronic diastolic heart failure with EF 65%, grade 2 diastolic dysfunction, persistent A. fib on Eliquis, right bundle branch block, hypertension.  I saw the patient on July 17, 2020 at that time she was doing well from a cardiovascular standpoint.  She still did have some fatigue but she felt close to her baseline.   Past Medical History:  Diagnosis Date  . Abnormal perfusion scintigraphy 07/22/2014   Lexiscan MPS Jan 2012 with mild mis and apical anterior ischemia EF 59% she was seen at [facility] MPS Jan 2012 with mild mis and apical anterior ischemia EF 59% she was seen at [facility] of this note might be different from the original. Overview:  Lexiscan MPS Jan 2012 with mild mis and apical anterior ischemia EF 59% she was seen at [facility] of this note might be different from  . Anxiety disorder 07/22/2014  . B12 deficiency 07/22/2014  . Benign hypertensive heart and kidney disease with chronic kidney disease 07/22/2014  . CAD (coronary artery disease) 01/02/2020  . Chronic kidney disease, stage 2 (mild) 07/22/2014  . Degeneration, intervertebral disc, cervical 07/22/2014  . Depression, major, single episode, moderate (HCC) 07/22/2014  . Essential hypertension 09/10/2019  . Former smoker 10/20/2017  . Hypercholesterolemia 07/22/2014  . HYPOTHYROIDISM, POST-RADIATION 06/11/2009   Qualifier: Diagnosis of  By: Everardo All MD, Cleophas Dunker   . Idiopathic chronic gout of multiple sites without tophus 12/11/2015  . Idiopathic peripheral neuropathy 12/11/2015  . Low back pain 07/22/2014  . Mitral regurgitation 09/10/2019  . Onychomycosis due to  dermatophyte 07/22/2014  . Osteoarthritis, generalized 07/22/2014  . Osteoporosis 08/24/2018   DEXA 08/23/2018  Formatting of this note might be different from the original. DEXA 08/23/2018  . Pulmonary hypertension (HCC) 12/08/2017  . PVC (premature ventricular contraction) 04/17/2018  . RBBB 10/20/2017  . Recurrent UTI 12/11/2015  . Sciatica 07/22/2014  . Spinal stenosis of lumbar region 12/11/2015  . TMJ arthralgia 07/22/2014  . Unspecified abnormalities of gait and mobility 07/22/2014  . Urge incontinence of urine 07/22/2014   Wears 8 extra absorbent pads during day an on overnight pad at night  Formatting of this note might be different from the original. Wears 8 extra absorbent pads during day an on overnight pad at night  . Varicose veins 07/22/2014  . Venous stasis dermatitis of both lower extremities 07/22/2014  . Vitamin D deficiency 07/22/2014    Past Surgical History:  Procedure Laterality Date  . BACK SURGERY    . CARDIOVERSION N/A 07/01/2020   Procedure: CARDIOVERSION;  Surgeon: Lewayne Bunting, MD;  Location: [facility] ENDOSCOPY;  Service: Cardiovascular;  Laterality: N/A;  . JOINT REPLACEMENT     hip and knee  . TEE WITHOUT CARDIOVERSION N/A 07/01/2020   Procedure: TRANSESOPHAGEAL ECHOCARDIOGRAM (TEE);  Surgeon: Lewayne Bunting, MD;  Location: [facility] ENDOSCOPY;  Service: Cardiovascular;  Laterality: N/A;    Current Medications: Current Meds  Medication Sig  . acetaminophen (TYLENOL) 500 MG tablet Take 1,000 mg by mouth every 6 (six) hours as needed for mild pain.  Marland Kitchen allopurinol (  ZYLOPRIM) 100 MG tablet Take 1 tablet (100 mg total) by mouth daily.  Marland Kitchen atorvastatin (LIPITOR) 10 MG tablet Take 1 tablet (10 mg total) by mouth daily.  . carvedilol (COREG) 3.125 MG tablet Take 1 tablet (3.125 mg total) by mouth 2 (two) times daily with a meal.  . cholecalciferol (VITAMIN D) 1000 UNITS tablet Take 1,000 Units by mouth daily.  . colchicine 0.6 MG tablet Take 1 tablet (0.6 mg total) by mouth  daily.  Marland Kitchen diltiazem (CARDIZEM CD) 120 MG 24 hr capsule Take 1 capsule (120 mg total) by mouth daily.  Marland Kitchen diltiazem (TIAZAC) 120 MG 24 hr capsule Take by mouth.  Everlene Balls 5 MG TABS tablet Take 1 tablet (5 mg total) by mouth 2 (two) times daily.  . furosemide (LASIX) 20 MG tablet Take 1 tablet (20 mg total) by mouth daily.  . hydrocortisone cream 1 % Apply 1 application topically 2 (two) times daily. To back until rash resolved  . levothyroxine (SYNTHROID) 100 MCG tablet Take 1 tablet (100 mcg total) by mouth daily.  Marland Kitchen telmisartan (MICARDIS) 20 MG tablet Take 1 tablet (20 mg total) by mouth daily.  . vitamin B-12 (CYANOCOBALAMIN) 1000 MCG tablet Take 1,000 mcg by mouth daily.     Allergies:   Isosorbide nitrate, Cephalexin, Gentamicin, Penicillins, and Sulfa antibiotics   Social History   Socioeconomic History  . Marital status: Widowed    Spouse name: Not on file  . Number of children: Not on file  . Years of education: Not on file  . Highest education level: Not on file  Occupational History  . Not on file  Tobacco Use  . Smoking status: Never Smoker  . Smokeless tobacco: Never Used  Substance and Sexual Activity  . Alcohol use: No  . Drug use: No  . Sexual activity: Not on file  Other Topics Concern  . Not on file  Social History Narrative  . Not on file   Social Determinants of Health   Financial Resource Strain: Not on file  Food Insecurity: Not on file  Transportation Needs: Not on file  Physical Activity: Not on file  Stress: Not on file  Social Connections: Not on file     Family History: The patient's family history includes Cancer in her mother; Emphysema in her brother; Heart attack in her brother; Heart disease in her brother; Hypertension in her mother; Prostate cancer in her father.  ROS:   Review of Systems  Constitution: Negative for decreased appetite, fever and weight gain.  HENT: Negative for congestion, ear discharge, hoarse voice and sore throat.    Eyes: Negative for discharge, redness, vision loss in right eye and visual halos.  Cardiovascular: Negative for chest pain, dyspnea on exertion, leg swelling, orthopnea and palpitations.  Respiratory: Negative for cough, hemoptysis, shortness of breath and snoring.   Endocrine: Negative for heat intolerance and polyphagia.  Hematologic/Lymphatic: Negative for bleeding problem. Does not bruise/bleed easily.  Skin: Negative for flushing, nail changes, rash and suspicious lesions.  Musculoskeletal: Negative for arthritis, joint pain, muscle cramps, myalgias, neck pain and stiffness.  Gastrointestinal: Negative for abdominal pain, bowel incontinence, diarrhea and excessive appetite.  Genitourinary: Negative for decreased libido, genital sores and incomplete emptying.  Neurological: Negative for brief paralysis, focal weakness, headaches and loss of balance.  Psychiatric/Behavioral: Negative for altered mental status, depression and suicidal ideas.  Allergic/Immunologic: Negative for HIV exposure and persistent infections.    EKGs/Labs/Other Studies Reviewed:    The following studies were reviewed  today:   EKG:  The ekg ordered today demonstrates   Transthoracic echocardiogramIMPRESSIONS  1. Inferior basal hypokinesis. Left ventricular ejection fraction, by  estimation, is 55%. The left ventricle has normal function. The left  ventricle has no regional wall motion abnormalities. Left ventricular  diastolic parameters are indeterminate.  2. Right ventricular systolic function is normal. The right ventricular  size is normal. There is normal pulmonary artery systolic pressure.  3. Left atrial size was moderately dilated.  4. Right atrial size was mildly dilated.  5. The mitral valve is normal in structure. Mild mitral valve  regurgitation. No evidence of mitral stenosis.  6. The aortic valve is normal in structure. Aortic valve regurgitation is  not visualized. No aortic stenosis is  present.  7. The inferior vena cava is dilated in size with >50% respiratory  variability, suggesting right atrial pressure of 8 mmHg.   FINDINGS  Left Ventricle: Inferior basal hypokinesis. Left ventricular ejection  fraction, by estimation, is 55%. The left ventricle has normal function.  The left ventricle has no regional wall motion abnormalities. The left  ventricular internal cavity size was  normal in size. There is no left ventricular hypertrophy. Left ventricular  diastolic parameters are indeterminate.   Right Ventricle: The right ventricular size is normal. No increase in  right ventricular wall thickness. Right ventricular systolic function is  normal. There is normal pulmonary artery systolic pressure. The tricuspid  regurgitant velocity is 2.58 m/s, and  with an assumed right atrial pressure of 8 mmHg, the estimated right  ventricular systolic pressure is 34.6 mmHg.   Left Atrium: Left atrial size was moderately dilated.   Right Atrium: Right atrial size was mildly dilated.   Pericardium: There is no evidence of pericardial effusion.   Mitral Valve: The mitral valve is normal in structure. Mild mitral valve  regurgitation. No evidence of mitral valve stenosis.   Tricuspid Valve: The tricuspid valve is normal in structure. Tricuspid  valve regurgitation is mild . No evidence of tricuspid stenosis.   Aortic Valve: The aortic valve is normal in structure. Aortic valve  regurgitation is not visualized. No aortic stenosis is present.   Pulmonic Valve: The pulmonic valve was normal in structure. Pulmonic valve  regurgitation is not visualized. No evidence of pulmonic stenosis.   Aorta: The aortic root is normal in size and structure.   Venous: The inferior vena cava is dilated in size with greater than 50%  respiratory variability, suggesting right atrial pressure of 8 mmHg.   IAS/Shunts: No atrial level shunt detected by color flow Doppler.   Recent  Labs: 01/03/2020: ALT 10 06/24/2020: B Natriuretic Peptide 346.3 06/25/2020: TSH 8.527 07/17/2020: BUN 32; Creatinine, Ser 1.03; Hemoglobin 12.4; Magnesium 1.9; Platelets 245; Potassium 4.0; Sodium 137  Recent Lipid Panel    Component Value Date/Time   CHOL 180 01/03/2020 0911   TRIG 57 01/03/2020 0911   HDL 74 01/03/2020 0911   CHOLHDL 2.4 01/03/2020 0911   LDLCALC 95 01/03/2020 0911    Physical Exam:    VS:  BP 114/70   Pulse 60   Ht 5\' 10"  (1.778 m)   Wt 168 lb (76.2 kg)   SpO2 96%   BMI 24.11 kg/m     Wt Readings from Last 3 Encounters:  09/11/20 168 lb (76.2 kg)  07/17/20 166 lb (75.3 kg)  07/14/20 163 lb (73.9 kg)     GEN: Well nourished, well developed in no acute distress HEENT: Normal NECK: No JVD; No  carotid bruits LYMPHATICS: No lymphadenopathy CARDIAC: S1S2 noted,RRR, no murmurs, rubs, gallops RESPIRATORY:  Clear to auscultation without rales, wheezing or rhonchi  ABDOMEN: Soft, non-tender, non-distended, +bowel sounds, no guarding. EXTREMITIES: +2 bilateral leg edema edema, No cyanosis, no clubbing MUSCULOSKELETAL:  No deformity  SKIN: Warm and dry NEUROLOGIC:  Alert and oriented x 3, non-focal PSYCHIATRIC:  Normal affect, good insight  ASSESSMENT:    1. Essential hypertension   2. Rheumatic mitral regurgitation   3. Hypertensive heart and kidney disease with heart failure (HCC)   4. Pulmonary hypertension (HCC)   5. PAF (paroxysmal atrial fibrillation) (HCC)   6. Chronic heart failure with preserved ejection fraction (HCC)   7. Atrial fibrillation, currently in sinus rhythm    PLAN:    She does have some leg edema today which I am going to increase her Lasix 40 mg in the morning for 3 days with 20 mg in the evening.  After 3 days she goes back to taking 20 mg in the morning 20 mg in the evening.  We will get blood work today to assess for electrolytes. Her blood pressure acceptable no changes will be made to her antihypertensive regimen. She will  continue on her Eliquis, Coreg and Cardizem for her atrial fibrillation. The patient is in agreement with the above plan. The patient left the office in stable condition.  The patient will follow up in 3 months   Medication Adjustments/Labs and Tests Ordered: Current medicines are reviewed at length with the patient today.  Concerns regarding medicines are outlined above.  No orders of the defined types were placed in this encounter.  No orders of the defined types were placed in this encounter.   There are no Patient Instructions on file for this visit.   Adopting a Healthy Lifestyle.  Know what a healthy weight is for you (roughly BMI <25) and aim to maintain this   Aim for 7+ servings of fruits and vegetables daily   65-80+ fluid ounces of water or unsweet tea for healthy kidneys   Limit to max 1 drink of alcohol per day; avoid smoking/tobacco   Limit animal fats in diet for cholesterol and heart health - choose grass fed whenever available   Avoid highly processed foods, and foods high in saturated/trans fats   Aim for low stress - take time to unwind and care for your mental health   Aim for 150 min of moderate intensity exercise weekly for heart health, and weights twice weekly for bone health   Aim for 7-9 hours of sleep daily   When it comes to diets, agreement about the perfect plan isnt easy to find, even among the experts. Experts at the [facility] of [employer] developed an idea known as the Healthy Eating Plate. Just imagine a plate divided into logical, healthy portions.   The emphasis is on diet quality:   Load up on vegetables and fruits - one-half of your plate: Aim for color and variety, and remember that potatoes dont count.   Go for whole grains - one-quarter of your plate: Whole wheat, barley, wheat berries, quinoa, oats, brown rice, and foods made with them. If you want pasta, go with whole wheat pasta.   Protein power - one-quarter of your  plate: Fish, chicken, beans, and nuts are all healthy, versatile protein sources. Limit red meat.   The diet, however, does go beyond the plate, offering a few other suggestions.   Use healthy plant oils, such as olive,  canola, soy, corn, sunflower and peanut. Check the labels, and avoid partially hydrogenated oil, which have unhealthy trans fats.   If youre thirsty, drink water. Coffee and tea are good in moderation, but skip sugary drinks and limit milk and dairy products to one or two daily servings.   The type of carbohydrate in the diet is more important than the amount. Some sources of carbohydrates, such as vegetables, fruits, whole grains, and beans-are healthier than others.   Finally, stay active  Signed, Thomasene RippleKardie Katherleen Folkes, DO  09/11/2020 1:56 PM    [facility]

## 2020-09-11 NOTE — Addendum Note (Signed)
Addended by: Eleonore Chiquito on: 09/11/2020 02:46 PM   Modules accepted: Orders

## 2020-09-11 NOTE — Patient Instructions (Signed)
Medication Instructions:  Your physician has recommended you make the following change in your medication:   Increase your Lasix 40 mg in the morning and 20 mg in the evening for 3 days then return to your normal dose of 20 mg twice daily.  *If you need a refill on your cardiac medications before your next appointment, please call your pharmacy*   Lab Work: Your physician recommends that you have labs done in the office today. Your test included  basic metabolic panel and magnesium.  If you have labs (blood work) drawn today and your tests are completely normal, you will receive your results only by:  MyChart Message (if you have MyChart) OR  A paper copy in the mail If you have any lab test that is abnormal or we need to change your treatment, we will call you to review the results.   Testing/Procedures: None ordered   Follow-Up: At [facility], you and your health needs are our priority.  As part of our continuing mission to provide you with exceptional heart care, we have created designated Provider Care Teams.  These Care Teams include your primary Cardiologist (physician) and Advanced Practice Providers (APPs -  Physician Assistants and Nurse Practitioners) who all work together to provide you with the care you need, when you need it.  We recommend signing up for the patient portal called "MyChart".  Sign up information is provided on this After Visit Summary.  MyChart is used to connect with patients for Virtual Visits (Telemedicine).  Patients are able to view lab/test results, encounter notes, upcoming appointments, etc.  Non-urgent messages can be sent to your provider as well.   To learn more about what you can do with MyChart, go to [email or web address].    Your next appointment:   3 month(s)  The format for your next appointment:   In Person  Provider:   Thomasene Ripple, DO   Other Instructions NA

## 2020-09-12 LAB — BASIC METABOLIC PANEL
BUN/Creatinine Ratio: 24 (ref 12–28)
BUN: 24 mg/dL (ref 8–27)
CO2: 23 mmol/L (ref 20–29)
Calcium: 9.1 mg/dL (ref 8.7–10.3)
Chloride: 100 mmol/L (ref 96–106)
Creatinine, Ser: 0.99 mg/dL (ref 0.57–1.00)
GFR calc Af Amer: 60 mL/min/{1.73_m2} (ref >59)
GFR calc non Af Amer: 52 mL/min/{1.73_m2} — ABNORMAL LOW (ref >59)
Glucose: 98 mg/dL (ref 65–99)
Potassium: 5.3 mmol/L — ABNORMAL HIGH (ref 3.5–5.2)
Sodium: 137 mmol/L (ref 134–144)

## 2020-09-12 LAB — MAGNESIUM: Magnesium: 1.9 mg/dL (ref 1.6–2.3)

## 2020-09-15 ENCOUNTER — Telehealth: Payer: Self-pay

## 2020-09-15 DIAGNOSIS — I272 Pulmonary hypertension, unspecified: Secondary | ICD-10-CM

## 2020-09-15 NOTE — Telephone Encounter (Signed)
-----   Message from Thomasene Ripple, DO sent at 09/15/2020  9:14 AM EST ----- Your creatinine is back to baseline.  Your potassium is slightly elevated, avoid eating too much bananas, milk, tomatoes, potatoes.  We will like to repeat your blood work in 2 weeks.

## 2020-09-15 NOTE — Telephone Encounter (Signed)
Spoke with patient regarding results and recommendation.  Patient verbalizes understanding and is agreeable to plan of care. Advised patient to call back with any issues or concerns.  

## 2020-10-22 ENCOUNTER — Ambulatory Visit: Payer: Medicare PPO | Admitting: Cardiology

## 2020-11-24 ENCOUNTER — Telehealth: Payer: Self-pay | Admitting: Cardiology

## 2020-11-24 NOTE — Telephone Encounter (Signed)
ew Message:    Pt called and wanted a sooner appt. She said she is just not feeling good, real anxious. I made her an appt for Thursday(11-27-20). Please call her to evaluate.

## 2020-11-25 NOTE — Telephone Encounter (Signed)
Spoke with patient about not feeling well yesterday. She states she was just nervous because she didn't know she was in A-fib before and she was worried she might be having an episode yesterday and wanted to get checked out sooner than the end of the month. Patient will be seen in office 3/10 at 9:20. She has no further questions or concerns at this time.

## 2020-11-26 DIAGNOSIS — I340 Nonrheumatic mitral (valve) insufficiency: Secondary | ICD-10-CM

## 2020-11-26 DIAGNOSIS — I361 Nonrheumatic tricuspid (valve) insufficiency: Secondary | ICD-10-CM | POA: Diagnosis not present

## 2020-11-27 ENCOUNTER — Ambulatory Visit: Payer: Medicare PPO | Admitting: Cardiology

## 2020-12-17 ENCOUNTER — Ambulatory Visit: Payer: Medicare PPO | Admitting: Cardiology

## 2020-12-29 ENCOUNTER — Other Ambulatory Visit: Payer: Self-pay

## 2020-12-29 ENCOUNTER — Ambulatory Visit: Payer: Medicare PPO | Admitting: Cardiology

## 2020-12-29 ENCOUNTER — Encounter: Payer: Self-pay | Admitting: Cardiology

## 2020-12-29 VITALS — BP 120/66 | HR 72 | Ht 70.0 in | Wt 169.2 lb

## 2020-12-29 DIAGNOSIS — I10 Essential (primary) hypertension: Secondary | ICD-10-CM

## 2020-12-29 DIAGNOSIS — I272 Pulmonary hypertension, unspecified: Secondary | ICD-10-CM

## 2020-12-29 DIAGNOSIS — I5032 Chronic diastolic (congestive) heart failure: Secondary | ICD-10-CM

## 2020-12-29 DIAGNOSIS — I130 Hypertensive heart and chronic kidney disease with heart failure and stage 1 through stage 4 chronic kidney disease, or unspecified chronic kidney disease: Secondary | ICD-10-CM | POA: Diagnosis not present

## 2020-12-29 DIAGNOSIS — I071 Rheumatic tricuspid insufficiency: Secondary | ICD-10-CM

## 2020-12-29 MED ORDER — FUROSEMIDE 40 MG PO TABS: 40.0000 mg | ORAL_TABLET | Freq: Two times a day (BID) | ORAL | 3 refills | Status: DC

## 2020-12-29 MED ORDER — POTASSIUM CHLORIDE CRYS ER 20 MEQ PO TBCR: 20.0000 meq | EXTENDED_RELEASE_TABLET | Freq: Two times a day (BID) | ORAL | 3 refills | Status: DC

## 2020-12-29 NOTE — Addendum Note (Signed)
Addended by: Reynolds Bowl on: 12/29/2020 02:12 PM   Modules accepted: Orders

## 2020-12-29 NOTE — Progress Notes (Signed)
Cardiology Office Note:    Date:  12/29/2020   ID:  [name], DOB 09/24/1931, MRN [id number]  PCP:  [name], [name]  Cardiologist:  Victoria RippleKardie Tyjai Matuszak, DO  Electrophysiologist:  None   Referring [name]: [name], [name], [name]   I am having more leg swelling  History of Present Illness:    [name] is a 87 y.o. female with a hx of coronary artery disease, chronic diastolic heart failure EF 65%, grade 2 diastolic dysfunction, persistent atrial fibrillation on EliquisIs here today for follow-up visit.  Did see the patient in December 2021 at that time we increased her Lasix to 40 mg in the a.[name]. and 20 mg in the evening for 3 days.   She is here today for follow-up visit.  Since I last saw the patient she had been [facility] numbness.  She was worked up and no new strokes were found.  Only problem she tells me is recently her leg is significant swelling.  She recently has started new PCP.  Past Medical History:  Diagnosis Date  . Abnormal perfusion scintigraphy 07/22/2014   Lexiscan MPS Jan 2012 with mild mis and apical anterior ischemia EF 59% [facility] Lexiscan MPS Jan 2012 with mild mis and apical anterior ischemia EF 59% [facility]  Formatting of this note might be different from the original. Overview:  Lexiscan MPS Jan 2012 with mild mis and apical anterior ischemia EF 59% [facility] Formatting of this note might be different from  . Anxiety disorder 07/22/2014  . B12 deficiency 07/22/2014  . Benign hypertensive heart and kidney disease with chronic kidney disease 07/22/2014  . CAD (coronary artery disease) 01/02/2020  . Chronic kidney disease, stage 2 (mild) 07/22/2014  . Degeneration, intervertebral disc, cervical 07/22/2014  . Depression, major, single episode, moderate (HCC) 07/22/2014  . Essential hypertension 09/10/2019  . Former smoker 10/20/2017  . Hypercholesterolemia 07/22/2014  . HYPOTHYROIDISM, POST-RADIATION  06/11/2009   Qualifier: Diagnosis of  By: Everardo AllEllison [name], Cleophas DunkerSean A   . Idiopathic chronic gout of multiple sites without tophus 12/11/2015  . Idiopathic peripheral neuropathy 12/11/2015  . Low back pain 07/22/2014  . Mitral regurgitation 09/10/2019  . Onychomycosis due to dermatophyte 07/22/2014  . Osteoarthritis, generalized 07/22/2014  . Osteoporosis 08/24/2018   DEXA 08/23/2018  Formatting of this note might be different from the original. DEXA 08/23/2018  . Pulmonary hypertension (HCC) 12/08/2017  . PVC (premature ventricular contraction) 04/17/2018  . RBBB 10/20/2017  . Recurrent UTI 12/11/2015  . Sciatica 07/22/2014  . Spinal stenosis of lumbar region 12/11/2015  . TMJ arthralgia 07/22/2014  . Unspecified abnormalities of gait and mobility 07/22/2014  . Urge incontinence of urine 07/22/2014   Wears 8 extra absorbent pads during day an on overnight pad at night  Formatting of this note might be different from the original. Wears 8 extra absorbent pads during day an on overnight pad at night  . Varicose veins 07/22/2014  . Venous stasis dermatitis of both lower extremities 07/22/2014  . Vitamin D deficiency 07/22/2014    Past Surgical History:  Procedure Laterality Date  . BACK SURGERY    . CARDIOVERSION N/A 07/01/2020   Procedure: CARDIOVERSION;  Surgeon: Lewayne Buntingrenshaw, Brian S, [name];  [facility];  Service: Cardiovascular;  Laterality: N/A;  . JOINT REPLACEMENT     hip and knee  . TEE WITHOUT CARDIOVERSION N/A 07/01/2020   Procedure: TRANSESOPHAGEAL  ECHOCARDIOGRAM (TEE);  Surgeon: Lewayne Bunting, [name];  Location: [facility] ENDOSCOPY;  Service: Cardiovascular;  Laterality: N/A;    Current Medications: Current Meds  Medication Sig  . acetaminophen (TYLENOL) 500 MG tablet Take 1,000 mg by mouth every 6 (six) hours as needed for mild pain.  Marland Kitchen allopurinol (ZYLOPRIM) 100 MG tablet Take 1 tablet (100 mg total) by mouth daily.  . cholecalciferol (VITAMIN D) 1000 UNITS tablet Take 1,000 Units by mouth  daily.  Marland Kitchen diltiazem (CARDIZEM CD) 120 MG 24 hr capsule Take 1 capsule (120 mg total) by mouth daily.  Marland Kitchen ELIQUIS 5 MG TABS tablet Take 1 tablet (5 mg total) by mouth 2 (two) times daily.  . furosemide (LASIX) 20 MG tablet Take 1 tablet (20 mg total) by mouth daily.  . hydrocortisone cream 1 % Apply 1 application topically 2 (two) times daily. To back until rash resolved  . levothyroxine (SYNTHROID) 100 MCG tablet Take 1 tablet (100 mcg total) by mouth daily.  Marland Kitchen telmisartan (MICARDIS) 20 MG tablet Take 1 tablet (20 mg total) by mouth daily.  . vitamin B-12 (CYANOCOBALAMIN) 1000 MCG tablet Take 1,000 mcg by mouth daily.  . [DISCONTINUED] colchicine 0.6 MG tablet Take 1 tablet (0.6 mg total) by mouth daily.  . [DISCONTINUED] diltiazem (TIAZAC) 120 MG 24 hr capsule Take by mouth.     Allergies:   Isosorbide nitrate, Cephalexin, Gentamicin, Penicillins, and Sulfa antibiotics   Social History   Socioeconomic History  . Marital status: Widowed    Spouse name: Not on file  . Number of children: Not on file  . Years of education: Not on file  . Highest education level: Not on file  Occupational History  . Not on file  Tobacco Use  . Smoking status: Never Smoker  . Smokeless tobacco: Never Used  Substance and Sexual Activity  . Alcohol use: No  . Drug use: No  . Sexual activity: Not on file  Other Topics Concern  . Not on file  Social History Narrative  . Not on file   Social Determinants of Health   Financial Resource Strain: Not on file  Food Insecurity: Not on file  Transportation Needs: Not on file  Physical Activity: Not on file  Stress: Not on file  Social Connections: Not on file     Family History: The patient's family history includes Cancer in her mother; Emphysema in her brother; Heart attack in her brother; Heart disease in her brother; Hypertension in her mother; Prostate cancer in her father.  ROS:   Review of Systems  Constitution: Negative for decreased  appetite, fever and weight gain.  HENT: Negative for congestion, ear discharge, hoarse voice and sore throat.   Eyes: Negative for discharge, redness, vision loss in right eye and visual halos.  Cardiovascular: Negative for chest pain, dyspnea on exertion, leg swelling, orthopnea and palpitations.  Respiratory: Negative for cough, hemoptysis, shortness of breath and snoring.   Endocrine: Negative for heat intolerance and polyphagia.  Hematologic/Lymphatic: Negative for bleeding problem. Does not bruise/bleed easily.  Skin: Negative for flushing, nail changes, rash and suspicious lesions.  Musculoskeletal: Negative for arthritis, joint pain, muscle cramps, myalgias, neck pain and stiffness.  Gastrointestinal: Negative for abdominal pain, bowel incontinence, diarrhea and excessive appetite.  Genitourinary: Negative for decreased libido, genital sores and incomplete emptying.  Neurological: Negative for brief paralysis, focal weakness, headaches and loss of balance.  Psychiatric/Behavioral: Negative for altered mental status, depression and suicidal ideas.  Allergic/Immunologic: Negative for HIV exposure and  persistent infections.    EKGs/Labs/Other Studies Reviewed:    The following studies were reviewed today:   EKG: None today  Transthoracic echocardiogramIMPRESSIONS  1. Inferior basal hypokinesis. Left ventricular ejection fraction, by  estimation, is 55%. The left ventricle has normal function. The left  ventricle has no regional wall motion abnormalities. Left ventricular  diastolic parameters are indeterminate.  2. Right ventricular systolic function is normal. The right ventricular  size is normal. There is normal pulmonary artery systolic pressure.  3. Left atrial size was moderately dilated.  4. Right atrial size was mildly dilated.  5. The mitral valve is normal in structure. Mild mitral valve  regurgitation. No evidence of mitral stenosis.  6. The aortic valve is  normal in structure. Aortic valve regurgitation is  not visualized. No aortic stenosis is present.  7. The inferior vena cava is dilated in size with >50% respiratory  variability, suggesting right atrial pressure of 8 mmHg.   FINDINGS  Left Ventricle: Inferior basal hypokinesis. Left ventricular ejection  fraction, by estimation, is 55%. The left ventricle has normal function.  The left ventricle has no regional wall motion abnormalities. The left  ventricular internal cavity size was  normal in size. There is no left ventricular hypertrophy. Left ventricular  diastolic parameters are indeterminate.   Right Ventricle: The right ventricular size is normal. No increase in  right ventricular wall thickness. Right ventricular systolic function is  normal. There is normal pulmonary artery systolic pressure. The tricuspid  regurgitant velocity is 2.58 [name]/s, and  with an assumed right atrial pressure of 8 mmHg, the estimated right  ventricular systolic pressure is 34.6 mmHg.   Left Atrium: Left atrial size was moderately dilated.   Right Atrium: Right atrial size was mildly dilated.   Pericardium: There is no evidence of pericardial effusion.   Mitral Valve: The mitral valve is normal in structure. Mild mitral valve  regurgitation. No evidence of mitral valve stenosis.   Tricuspid Valve: The tricuspid valve is normal in structure. Tricuspid  valve regurgitation is mild . No evidence of tricuspid stenosis.   Aortic Valve: The aortic valve is normal in structure. Aortic valve  regurgitation is not visualized. No aortic stenosis is present.   Pulmonic Valve: The pulmonic valve was normal in structure. Pulmonic valve  regurgitation is not visualized. No evidence of pulmonic stenosis.   Aorta: The aortic root is normal in size and structure.   Venous: The inferior vena cava is dilated in size with greater than 50%  respiratory variability, suggesting right atrial pressure of 8 mmHg.     Recent Labs: 01/03/2020: ALT 10 06/24/2020: B Natriuretic Peptide 346.3 06/25/2020: TSH 8.527 07/17/2020: Hemoglobin 12.4; Platelets 245 09/11/2020: BUN 24; Creatinine, Ser 0.99; Magnesium 1.9; Potassium 5.3; Sodium 137  Recent Lipid Panel    Component Value Date/Time   CHOL 180 01/03/2020 0911   TRIG 57 01/03/2020 0911   HDL 74 01/03/2020 0911   CHOLHDL 2.4 01/03/2020 0911   LDLCALC 95 01/03/2020 0911    Physical Exam:    VS:  BP 120/66   Pulse 72   Ht 5\' 10"  (1.778 [name])   Wt 169 lb 3.2 oz (76.7 kg)   SpO2 94%   BMI 24.28 kg/[name]     Wt Readings from Last 3 Encounters:  12/29/20 169 lb 3.2 oz (76.7 kg)  09/11/20 168 lb (76.2 kg)  07/17/20 166 lb (75.3 kg)     GEN: Well nourished, well developed in no acute distress HEENT:  Normal NECK: No JVD; No carotid bruits LYMPHATICS: No lymphadenopathy CARDIAC: S1S2 noted,RRR, no murmurs, rubs, gallops RESPIRATORY:  Clear to auscultation without rales, wheezing or rhonchi  ABDOMEN: Soft, non-tender, non-distended, +bowel sounds, no guarding. EXTREMITIES: +3 bilateral pitting leg edema, No cyanosis, no clubbing MUSCULOSKELETAL:  No deformity  SKIN: Warm and dry NEUROLOGIC:  Alert and oriented x 3, non-focal PSYCHIATRIC:  Normal affect, good insight  ASSESSMENT:    1. Essential hypertension   2. Hypertensive heart and kidney disease with heart failure (HCC)   3. Pulmonary hypertension (HCC)   4. Moderate tricuspid regurgitation   5. Chronic heart failure with preserved ejection fraction (HCC)    PLAN:     She has worsening pitting edema we will like to do is increase her Lasix to 40 mg twice a day with potassium supplement.  She had an echocardiogram in March while she was at [facility] which showed  her EF was 60 to 65% she had moderate TR and moderate mitral regurgitation which were slightly increased from previous echo.  She will remain on Cardizem as well as her Eliquis 5 mg twice a day for atrial  fibrillation.  Her blood pressure acceptable in the office.  Blood work will be done today for BMP and mag.   The patient is in agreement with the above plan. The patient left the office in stable condition.  The patient will follow up in 8 weeks due to medication change   Medication Adjustments/Labs and Tests Ordered: Current medicines are reviewed at length with the patient today.  Concerns regarding medicines are outlined above.  No orders of the defined types were placed in this encounter.  No orders of the defined types were placed in this encounter.   There are no Patient Instructions on file for this visit.   Adopting a Healthy Lifestyle.  Know what a healthy weight is for you (roughly BMI <25) and aim to maintain this   Aim for 7+ servings of fruits and vegetables daily   65-80+ fluid ounces of water or unsweet tea for healthy kidneys   Limit to max 1 drink of alcohol per day; avoid smoking/tobacco   Limit animal fats in diet for cholesterol and heart health - choose grass fed whenever available   Avoid highly processed foods, and foods high in saturated/trans fats   Aim for low stress - take time to unwind and care for your mental health   Aim for 150 min of moderate intensity exercise weekly for heart health, and weights twice weekly for bone health   Aim for 7-9 hours of sleep daily   When it comes to diets, agreement about the perfect plan isnt easy to find, even among the experts. Experts at the [facility] [employer] developed an idea known as the Healthy Eating Plate. Just imagine a plate divided into logical, healthy portions.   The emphasis is on diet quality:   Load up on vegetables and fruits - one-half of your plate: Aim for color and variety, and remember that potatoes dont count.   Go for whole grains - one-quarter of your plate: Whole wheat, barley, wheat berries, quinoa, oats, brown rice, and foods made with them. If you want pasta, go  with whole wheat pasta.   Protein power - one-quarter of your plate: Fish, chicken, beans, and nuts are all healthy, versatile protein sources. Limit red meat.   The diet, however, does go beyond the plate, offering a few other suggestions.  Use healthy plant oils, such as olive, canola, soy, corn, sunflower and peanut. Check the labels, and avoid partially hydrogenated oil, which have unhealthy trans fats.   If youre thirsty, drink water. Coffee and tea are good in moderation, but skip sugary drinks and limit milk and dairy products to one or two daily servings.   The type of carbohydrate in the diet is more important than the amount. Some sources of carbohydrates, such as vegetables, fruits, whole grains, and beans-are healthier than others.   Finally, stay active  Signed, Victoria Ripple, DO  12/29/2020 2:03 PM    [facility]

## 2020-12-29 NOTE — Patient Instructions (Signed)
Medication Instructions:  Your physician has recommended you make the following change in your medication:  INCREASE:  Lasix 40 mg twice daily START: Potassium 20 meq twice daily *If you need a refill on your cardiac medications before your next appointment, please call your pharmacy*   Lab Work: Your physician recommends that you return for lab work in: TODAY: BMET, Mag, BNP If you have labs (blood work) drawn today and your tests are completely normal, you will receive your results only by: Marland Kitchen MyChart Message (if you have MyChart) OR . A paper copy in the mail If you have any lab test that is abnormal or we need to change your treatment, we will call you to review the results.   Testing/Procedures: None   Follow-Up: At [facility], you and your health needs are our priority.  As part of our continuing mission to provide you with exceptional heart care, we have created designated Provider Care Teams.  These Care Teams include your primary Cardiologist (physician) and Advanced Practice Providers (APPs -  Physician Assistants and Nurse Practitioners) who all work together to provide you with the care you need, when you need it.  We recommend signing up for the patient portal called "MyChart".  Sign up information is provided on this After Visit Summary.  MyChart is used to connect with patients for Virtual Visits (Telemedicine).  Patients are able to view lab/test results, encounter notes, upcoming appointments, etc.  Non-urgent messages can be sent to your provider as well.   To learn more about what you can do with MyChart, go to [email or web address].    Your next appointment:   8 week(s)  The format for your next appointment:   In Person  Provider:   Thomasene Ripple, DO   Other Instructions

## 2020-12-30 LAB — BASIC METABOLIC PANEL
BUN/Creatinine Ratio: 34 — ABNORMAL HIGH (ref 12–28)
BUN: 32 mg/dL — ABNORMAL HIGH (ref 8–27)
CO2: 21 mmol/L (ref 20–29)
Calcium: 9.3 mg/dL (ref 8.7–10.3)
Chloride: 99 mmol/L (ref 96–106)
Creatinine, Ser: 0.93 mg/dL (ref 0.57–1.00)
Glucose: 83 mg/dL (ref 65–99)
Potassium: 4.8 mmol/L (ref 3.5–5.2)
Sodium: 140 mmol/L (ref 134–144)
eGFR: 59 mL/min/{1.73_m2} — ABNORMAL LOW (ref >59)

## 2020-12-30 LAB — PRO B NATRIURETIC PEPTIDE: NT-Pro BNP: 1941 pg/mL — ABNORMAL HIGH (ref 0–738)

## 2020-12-30 LAB — TSH: TSH: 8.16 u[IU]/mL — ABNORMAL HIGH (ref 0.450–4.500)

## 2020-12-30 LAB — MAGNESIUM: Magnesium: 1.9 mg/dL (ref 1.6–2.3)

## 2021-01-01 ENCOUNTER — Telehealth: Payer: Self-pay | Admitting: Cardiology

## 2021-01-01 NOTE — Telephone Encounter (Signed)
Pt c/o medication issue:  1. Name of Medication: potassium chloride SA (KLOR-CON) 20 MEQ tablet  2. How are you currently taking this medication (dosage and times per day)? Patient only took 1 pill so far. Patient just picked up medication yesterday  3. Are you having a reaction (difficulty breathing--STAT)?   4. What is your medication issue? Patient was told 3 months ago to limit food that has potassium in it. Now she is on a higher dose of potassium and a higher dose of lasix as well.   She wanted to know if Dr. Servando Salina checked her Potassium when she was in the office 4/11

## 2021-01-02 ENCOUNTER — Telehealth: Payer: Self-pay

## 2021-01-02 NOTE — Telephone Encounter (Signed)
Spoke with patient, see chart.    

## 2021-01-02 NOTE — Telephone Encounter (Signed)
Spoke with patient about her lab results.She was worried about taking the potassium pills since she was told to limit her potassium rich foods a few months ago. She was reassured her potassium was in normal range and she can take the medication as prescribed. Patient verbalizes understanding. No further questions or concerns at this time.

## 2021-02-05 ENCOUNTER — Telehealth: Payer: Self-pay | Admitting: Cardiology

## 2021-02-05 NOTE — Telephone Encounter (Signed)
Spoke to the patients son just now and he was wondering what his moms diagnosis was and why she was having this leg swelling hence the increase in her furosemide in April. He was not on the patients DPR so I spoke with Mrs. Varas directly who gave me permission to speak with him.   I went over Dr. Mallory Shirk most recent office note and the patients diagnosis with him and he verbalizes understanding. He just wanted to get more information and was thankful for the call back.    Encouraged patient to call back with any questions or concerns.

## 2021-02-05 NOTE — Telephone Encounter (Signed)
Pt c/o medication issue: 1. Name of Medication: lasix 2. How are you currently taking this medication (dosage and times per day)? Once aday 3. Are you having a reaction (difficulty breathing--STAT)?  no 4. What is your medication issue? Patient son would like to speak with a nurse concering this medication.

## 2021-02-20 ENCOUNTER — Ambulatory Visit: Payer: Medicare PPO | Admitting: Cardiology

## 2021-02-20 ENCOUNTER — Encounter: Payer: Self-pay | Admitting: Cardiology

## 2021-02-20 ENCOUNTER — Other Ambulatory Visit: Payer: Self-pay

## 2021-02-20 VITALS — BP 120/70 | HR 90 | Ht 70.0 in | Wt 163.4 lb

## 2021-02-20 DIAGNOSIS — E559 Vitamin D deficiency, unspecified: Secondary | ICD-10-CM

## 2021-02-20 DIAGNOSIS — I130 Hypertensive heart and chronic kidney disease with heart failure and stage 1 through stage 4 chronic kidney disease, or unspecified chronic kidney disease: Secondary | ICD-10-CM | POA: Diagnosis not present

## 2021-02-20 DIAGNOSIS — I272 Pulmonary hypertension, unspecified: Secondary | ICD-10-CM | POA: Diagnosis not present

## 2021-02-20 DIAGNOSIS — R0600 Dyspnea, unspecified: Secondary | ICD-10-CM

## 2021-02-20 DIAGNOSIS — I10 Essential (primary) hypertension: Secondary | ICD-10-CM

## 2021-02-20 DIAGNOSIS — I480 Paroxysmal atrial fibrillation: Secondary | ICD-10-CM

## 2021-02-20 DIAGNOSIS — R600 Localized edema: Secondary | ICD-10-CM

## 2021-02-20 DIAGNOSIS — N882 Stricture and stenosis of cervix uteri: Secondary | ICD-10-CM

## 2021-02-20 DIAGNOSIS — I361 Nonrheumatic tricuspid (valve) insufficiency: Secondary | ICD-10-CM

## 2021-02-20 DIAGNOSIS — I340 Nonrheumatic mitral (valve) insufficiency: Secondary | ICD-10-CM | POA: Diagnosis not present

## 2021-02-20 DIAGNOSIS — R0609 Other forms of dyspnea: Secondary | ICD-10-CM

## 2021-02-20 DIAGNOSIS — N950 Postmenopausal bleeding: Secondary | ICD-10-CM

## 2021-02-20 DIAGNOSIS — I493 Ventricular premature depolarization: Secondary | ICD-10-CM

## 2021-02-20 HISTORY — DX: Nonrheumatic tricuspid (valve) insufficiency: I36.1

## 2021-02-20 HISTORY — DX: Stricture and stenosis of cervix uteri: N88.2

## 2021-02-20 HISTORY — DX: Localized edema: R60.0

## 2021-02-20 HISTORY — DX: Postmenopausal bleeding: N95.0

## 2021-02-20 NOTE — Patient Instructions (Signed)
Medication Instructions:  Your physician recommends that you continue on your current medications as directed. Please refer to the Current Medication list given to you today.  *If you need a refill on your cardiac medications before your next appointment, please call your pharmacy*   Lab Work: Your physician recommends that you return for lab work today: bmp, mg, cbc, pro bnp, vitamin d  If you have labs (blood work) drawn today and your tests are completely normal, you will receive your results only by: Marland Kitchen MyChart Message (if you have MyChart) OR . A paper copy in the mail If you have any lab test that is abnormal or we need to change your treatment, we will call you to review the results.   Testing/Procedures: None   Follow-Up: At [facility], you and your health needs are our priority.  As part of our continuing mission to provide you with exceptional heart care, we have created designated Provider Care Teams.  These Care Teams include your primary Cardiologist (physician) and Advanced Practice Providers (APPs -  Physician Assistants and Nurse Practitioners) who all work together to provide you with the care you need, when you need it.  We recommend signing up for the patient portal called "MyChart".  Sign up information is provided on this After Visit Summary.  MyChart is used to connect with patients for Virtual Visits (Telemedicine).  Patients are able to view lab/test results, encounter notes, upcoming appointments, etc.  Non-urgent messages can be sent to your provider as well.   To learn more about what you can do with MyChart, go to [email or web address].    Your next appointment:   3 month(s)  The format for your next appointment:   In Person  Provider:   Thomasene Ripple, DO   Other Instructions

## 2021-02-20 NOTE — Progress Notes (Signed)
Cardiology Office Note:    Date:  02/20/2021   ID:  Galion Nation, DOB 1932-07-13, MRN [id number]  PCP:  Paulina Fusi, MD  Cardiologist:  Thomasene Ripple, DO  Electrophysiologist:  None   Referring MD: Paulina Fusi, MD   " I am experiencing some itching  History of Present Illness:    [name] is a 87 y.o. female with a hx of coronary artery disease, chronic diastolic heart failure EF 65%, grade 2 diastolic dysfunction, persistent atrial fibrillation on EliquisIs here today for follow-up visit.  Did see the patient in December 2021 at that time we increased her Lasix to 40 mg in the a.m. and 20 mg in the evening for 3 days.  I saw the patient on December 29, 2020 at that time I increased her Lasix to 40 mg twice a day with potassium supplement.  I reviewed her echocardiogram which was done at [facility] showing EF 60 to 65% with moderate TR as well as moderate regurgitation.  We continued her Eliquis and her Cardizem for A. fib.  She is here today for follow-up visit.  Since I last saw the patient she tells me that she has had some itching.  She denies any chest pain or shortness of breath but has had significant leg swelling.   Past Medical History:  Diagnosis Date  . Abnormal perfusion scintigraphy 07/22/2014   Lexiscan MPS Jan 2012 with mild mis and apical anterior ischemia EF 59% she was seen at [facility] MPS Jan 2012 with mild mis and apical anterior ischemia EF 59% she was seen at [facility] of this note might be different from the original. Overview:  Lexiscan MPS Jan 2012 with mild mis and apical anterior ischemia EF 59% she was seen at [facility] of this note might be different from  . Anxiety disorder 07/22/2014  . B12 deficiency 07/22/2014  . Benign hypertensive heart and kidney disease with chronic kidney disease 07/22/2014  . CAD (coronary artery disease) 01/02/2020  . Chronic kidney disease, stage 2 (mild) 07/22/2014  . Degeneration,  intervertebral disc, cervical 07/22/2014  . Depression, major, single episode, moderate (HCC) 07/22/2014  . Essential hypertension 09/10/2019  . Former smoker 10/20/2017  . Hypercholesterolemia 07/22/2014  . HYPOTHYROIDISM, POST-RADIATION 06/11/2009   Qualifier: Diagnosis of  By: Everardo All MD, Cleophas Dunker   . Idiopathic chronic gout of multiple sites without tophus 12/11/2015  . Idiopathic peripheral neuropathy 12/11/2015  . Low back pain 07/22/2014  . Mitral regurgitation 09/10/2019  . Onychomycosis due to dermatophyte 07/22/2014  . Osteoarthritis, generalized 07/22/2014  . Osteoporosis 08/24/2018   DEXA 08/23/2018  Formatting of this note might be different from the original. DEXA 08/23/2018  . Pulmonary hypertension (HCC) 12/08/2017  . PVC (premature ventricular contraction) 04/17/2018  . RBBB 10/20/2017  . Recurrent UTI 12/11/2015  . Sciatica 07/22/2014  . Spinal stenosis of lumbar region 12/11/2015  . TMJ arthralgia 07/22/2014  . Unspecified abnormalities of gait and mobility 07/22/2014  . Urge incontinence of urine 07/22/2014   Wears 8 extra absorbent pads during day an on overnight pad at night  Formatting of this note might be different from the original. Wears 8 extra absorbent pads during day an on overnight pad at night  . Varicose veins 07/22/2014  . Venous stasis dermatitis of both lower extremities 07/22/2014  . Vitamin D deficiency 07/22/2014    Past Surgical History:  Procedure Laterality Date  . BACK SURGERY    . CARDIOVERSION N/A  07/01/2020   Procedure: CARDIOVERSION;  Surgeon: Lewayne Buntingrenshaw, Brian S, MD;  [facility];  Service: Cardiovascular;  Laterality: N/A;  . JOINT REPLACEMENT     hip and knee  . TEE WITHOUT CARDIOVERSION N/A 07/01/2020   Procedure: TRANSESOPHAGEAL ECHOCARDIOGRAM (TEE);  Surgeon: Lewayne Buntingrenshaw, Brian S, MD;  [facility];  Service: Cardiovascular;  Laterality: N/A;    Current Medications: Current Meds  Medication Sig  . acetaminophen (TYLENOL) 500 MG  tablet Take 1,000 mg by mouth every 6 (six) hours as needed for mild pain.  Marland Kitchen. allopurinol (ZYLOPRIM) 100 MG tablet Take 1 tablet (100 mg total) by mouth daily.  Marland Kitchen. atorvastatin (LIPITOR) 10 MG tablet Take 1 tablet (10 mg total) by mouth daily.  . carvedilol (COREG) 3.125 MG tablet Take 1 tablet (3.125 mg total) by mouth 2 (two) times daily with a meal.  . cholecalciferol (VITAMIN D) 1000 UNITS tablet Take 1,000 Units by mouth daily.  Marland Kitchen. diltiazem (CARDIZEM CD) 120 MG 24 hr capsule Take 1 capsule (120 mg total) by mouth daily.  Marland Kitchen. ELIQUIS 5 MG TABS tablet Take 1 tablet (5 mg total) by mouth 2 (two) times daily.  . furosemide (LASIX) 40 MG tablet Take 1 tablet (40 mg total) by mouth 2 (two) times daily.  . hydrocortisone cream 1 % Apply 1 application topically 2 (two) times daily. To back until rash resolved  . levothyroxine (SYNTHROID) 100 MCG tablet Take 1 tablet (100 mcg total) by mouth daily.  . potassium chloride SA (KLOR-CON) 20 MEQ tablet Take 1 tablet (20 mEq total) by mouth 2 (two) times daily.  Marland Kitchen. telmisartan (MICARDIS) 20 MG tablet Take 1 tablet (20 mg total) by mouth daily.  . vitamin B-12 (CYANOCOBALAMIN) 1000 MCG tablet Take 1,000 mcg by mouth daily.     Allergies:   Isosorbide nitrate, Cephalexin, Gentamicin, Penicillins, and Sulfa antibiotics   Social History   Socioeconomic History  . Marital status: Widowed    Spouse name: Not on file  . Number of children: Not on file  . Years of education: Not on file  . Highest education level: Not on file  Occupational History  . Not on file  Tobacco Use  . Smoking status: Never Smoker  . Smokeless tobacco: Never Used  Substance and Sexual Activity  . Alcohol use: No  . Drug use: No  . Sexual activity: Not on file  Other Topics Concern  . Not on file  Social History Narrative  . Not on file   Social Determinants of Health   Financial Resource Strain: Not on file  Food Insecurity: Not on file  Transportation Needs: Not on  file  Physical Activity: Not on file  Stress: Not on file  Social Connections: Not on file     Family History: The patient's family history includes Cancer in her mother; Emphysema in her brother; Heart attack in her brother; Heart disease in her brother; Hypertension in her mother; Prostate cancer in her father.  ROS:   Review of Systems  Constitution: Negative for decreased appetite, fever and weight gain.  HENT: Negative for congestion, ear discharge, hoarse voice and sore throat.   Eyes: Negative for discharge, redness, vision loss in right eye and visual halos.  Cardiovascular: Negative for chest pain, dyspnea on exertion, leg swelling, orthopnea and palpitations.  Respiratory: Negative for cough, hemoptysis, shortness of breath and snoring.   Endocrine: Negative for heat intolerance and polyphagia.  Hematologic/Lymphatic: Negative for bleeding problem. Does not bruise/bleed easily.  Skin: Negative  for flushing, nail changes, rash and suspicious lesions.  Musculoskeletal: Negative for arthritis, joint pain, muscle cramps, myalgias, neck pain and stiffness.  Gastrointestinal: Negative for abdominal pain, bowel incontinence, diarrhea and excessive appetite.  Genitourinary: Negative for decreased libido, genital sores and incomplete emptying.  Neurological: Negative for brief paralysis, focal weakness, headaches and loss of balance.  Psychiatric/Behavioral: Negative for altered mental status, depression and suicidal ideas.  Allergic/Immunologic: Negative for HIV exposure and persistent infections.    EKGs/Labs/Other Studies Reviewed:    The following studies were reviewed today:   EKG:  None today   Recent Labs: 06/24/2020: B Natriuretic Peptide 346.3 07/17/2020: Hemoglobin 12.4; Platelets 245 12/29/2020: BUN 32; Creatinine, Ser 0.93; Magnesium 1.9; NT-Pro BNP 1,941; Potassium 4.8; Sodium 140; TSH 8.160  Recent Lipid Panel    Component Value Date/Time   CHOL 180 01/03/2020  0911   TRIG 57 01/03/2020 0911   HDL 74 01/03/2020 0911   CHOLHDL 2.4 01/03/2020 0911   LDLCALC 95 01/03/2020 0911    Physical Exam:    VS:  BP 120/70   Pulse 90   Ht  (1.778 m)   Wt 163 lb 6.4 oz (74.1 kg)   SpO2 98%   BMI 23.45 kg/m     Wt Readings from Last 3 Encounters:  02/20/21 163 lb 6.4 oz (74.1 kg)  12/29/20 169 lb 3.2 oz (76.7 kg)  09/11/20 168 lb (76.2 kg)     GEN: Well nourished, well developed in no acute distress HEENT: Normal NECK: No JVD; No carotid bruits LYMPHATICS: No lymphadenopathy CARDIAC: S1S2 noted,RRR, no murmurs, rubs, gallops RESPIRATORY:  Clear to auscultation without rales, wheezing or rhonchi  ABDOMEN: Soft, non-tender, non-distended, +bowel sounds, no guarding. EXTREMITIES: Bilateral leg edema, No cyanosis, no clubbing MUSCULOSKELETAL:  No deformity  SKIN: Warm and dry NEUROLOGIC:  Alert and oriented x 3, non-focal PSYCHIATRIC:  Normal affect, good insight  ASSESSMENT:    1. Essential hypertension   2. Nonrheumatic mitral valve regurgitation   3. Hypertensive heart and kidney disease with heart failure (HCC)   4. Pulmonary hypertension (HCC)   5. PVC (premature ventricular contraction)   6. PAF (paroxysmal atrial fibrillation) (HCC)   7. Bilateral leg edema   8. Vitamin D deficiency   9. Dyspnea on exertion   10. Nonrheumatic tricuspid valve regurgitation    PLAN:    Her blood pressure is acceptable today.  We will continue her current medication regimen. She does have bilateral leg edema she is taking her Lasix 40 mg twice a day.  Like to get a BMP and mag and BNP to reassess the need for any optimization of her diuretics.  We talked again about the diagnosis of pulmonary hypertension I explained to the patient what that means.  She will remain on her anticoagulation for Eliquis.  The patient is in agreement with the above plan. The patient left the office in stable condition.  The patient will follow up in 3 months or  sooner if needed.   Medication Adjustments/Labs and Tests Ordered: Current medicines are reviewed at length with the patient today.  Concerns regarding medicines are outlined above.  Orders Placed This Encounter  Procedures  . Basic metabolic panel  . Magnesium  . CBC  . Pro b natriuretic peptide (BNP)  . Vitamin D (25 hydroxy)   No orders of the defined types were placed in this encounter.   Patient Instructions  Medication Instructions:  Your physician recommends that you continue on your current medications  as directed. Please refer to the Current Medication list given to you today.  *If you need a refill on your cardiac medications before your next appointment, please call your pharmacy*   Lab Work: Your physician recommends that you return for lab work today: bmp, mg, cbc, pro bnp, vitamin d  If you have labs (blood work) drawn today and your tests are completely normal, you will receive your results only by: Marland Kitchen MyChart Message (if you have MyChart) OR . A paper copy in the mail If you have any lab test that is abnormal or we need to change your treatment, we will call you to review the results.   Testing/Procedures: None   Follow-Up: At [facility], you and your health needs are our priority.  As part of our continuing mission to provide you with exceptional heart care, we have created designated Provider Care Teams.  These Care Teams include your primary Cardiologist (physician) and Advanced Practice Providers (APPs -  Physician Assistants and Nurse Practitioners) who all work together to provide you with the care you need, when you need it.  We recommend signing up for the patient portal called "MyChart".  Sign up information is provided on this After Visit Summary.  MyChart is used to connect with patients for Virtual Visits (Telemedicine).  Patients are able to view lab/test results, encounter notes, upcoming appointments, etc.  Non-urgent messages can be sent to  your provider as well.   To learn more about what you can do with MyChart, go to [email or web address].    Your next appointment:   3 month(s)  The format for your next appointment:   In Person  Provider:   Thomasene Ripple, DO   Other Instructions      Adopting a Healthy Lifestyle.  Know what a healthy weight is for you (roughly BMI <25) and aim to maintain this   Aim for 7+ servings of fruits and vegetables daily   65-80+ fluid ounces of water or unsweet tea for healthy kidneys   Limit to max 1 drink of alcohol per day; avoid smoking/tobacco   Limit animal fats in diet for cholesterol and heart health - choose grass fed whenever available   Avoid highly processed foods, and foods high in saturated/trans fats   Aim for low stress - take time to unwind and care for your mental health   Aim for 150 min of moderate intensity exercise weekly for heart health, and weights twice weekly for bone health   Aim for 7-9 hours of sleep daily   When it comes to diets, agreement about the perfect plan isnt easy to find, even among the experts. Experts at the [facility] of [employer] developed an idea known as the Healthy Eating Plate. Just imagine a plate divided into logical, healthy portions.   The emphasis is on diet quality:   Load up on vegetables and fruits - one-half of your plate: Aim for color and variety, and remember that potatoes dont count.   Go for whole grains - one-quarter of your plate: Whole wheat, barley, wheat berries, quinoa, oats, brown rice, and foods made with them. If you want pasta, go with whole wheat pasta.   Protein power - one-quarter of your plate: Fish, chicken, beans, and nuts are all healthy, versatile protein sources. Limit red meat.   The diet, however, does go beyond the plate, offering a few other suggestions.   Use healthy plant oils, such as olive, canola, soy, corn, sunflower and peanut.  Check the labels, and avoid partially  hydrogenated oil, which have unhealthy trans fats.   If youre thirsty, drink water. Coffee and tea are good in moderation, but skip sugary drinks and limit milk and dairy products to one or two daily servings.   The type of carbohydrate in the diet is more important than the amount. Some sources of carbohydrates, such as vegetables, fruits, whole grains, and beans-are healthier than others.   Finally, stay active  Signed, Thomasene Ripple, DO  02/20/2021 2:52 PM    [facility]

## 2021-02-21 LAB — BASIC METABOLIC PANEL
BUN/Creatinine Ratio: 31 — ABNORMAL HIGH (ref 12–28)
BUN: 35 mg/dL — ABNORMAL HIGH (ref 8–27)
CO2: 24 mmol/L (ref 20–29)
Calcium: 9.3 mg/dL (ref 8.7–10.3)
Chloride: 99 mmol/L (ref 96–106)
Creatinine, Ser: 1.13 mg/dL — ABNORMAL HIGH (ref 0.57–1.00)
Glucose: 97 mg/dL (ref 65–99)
Potassium: 5 mmol/L (ref 3.5–5.2)
Sodium: 136 mmol/L (ref 134–144)
eGFR: 47 mL/min/{1.73_m2} — ABNORMAL LOW (ref >59)

## 2021-02-21 LAB — CBC
Hematocrit: 33.1 % — ABNORMAL LOW (ref 34.0–46.6)
Hemoglobin: 11.1 g/dL (ref 11.1–15.9)
MCH: 32.6 pg (ref 26.6–33.0)
MCHC: 33.5 g/dL (ref 31.5–35.7)
MCV: 97 fL (ref 79–97)
Platelets: 232 10*3/uL (ref 150–450)
RBC: 3.4 x10E6/uL — ABNORMAL LOW (ref 3.77–5.28)
RDW: 13 % (ref 11.7–15.4)
WBC: 7.1 10*3/uL (ref 3.4–10.8)

## 2021-02-21 LAB — PRO B NATRIURETIC PEPTIDE: NT-Pro BNP: 2536 pg/mL — ABNORMAL HIGH (ref 0–738)

## 2021-02-21 LAB — VITAMIN D 25 HYDROXY (VIT D DEFICIENCY, FRACTURES): Vit D, 25-Hydroxy: 44.4 ng/mL (ref 30.0–100.0)

## 2021-03-31 ENCOUNTER — Telehealth: Payer: Self-pay | Admitting: Cardiology

## 2021-03-31 NOTE — Telephone Encounter (Signed)
Pt c/o medication issue:  1. Name of Medication: ELIQUIS 5 MG TABS tablet  2. How are you currently taking this medication (dosage and times per day)? 1 tablet twice a day  3. Are you having a reaction (difficulty breathing--STAT)? no  4. What is your medication issue? Patient states she injured her leg and had a hard time getting the bleeding to stop. She would like to know if she would be able to lower the dose of the eliquis.

## 2021-04-01 NOTE — Telephone Encounter (Signed)
Please let her know that her ideal dose for Eliquis is 5 mg twice daily.  But we can discuss this at her next visit with the risk and benefit for decreasing her dose.

## 2021-04-01 NOTE — Telephone Encounter (Signed)
Called patient to let her know about Dr. Mallory Shirk recommendations to continue the twice daily dose of Eliquis. She verbalized understanding can thanked me for calling her.

## 2021-04-27 ENCOUNTER — Telehealth: Payer: Self-pay | Admitting: Cardiology

## 2021-04-27 NOTE — Telephone Encounter (Signed)
Pt c/o medication issue:  1. Name of Medication: diltiazem (CARDIZEM CD) 120 MG 24 hr capsule  2. How are you currently taking this medication (dosage and times per day)? Take 1 capsule (120 mg total) by mouth daily  3. Are you having a reaction (difficulty breathing--STAT)? no  4. What is your medication issue? Stomach pain, not feeling well, diarrhea

## 2021-04-27 NOTE — Telephone Encounter (Signed)
Called patient. She states the pain started in the right side of her lower abdomen. She has had frequent diarrhea and a general feeling of not feeling well.  She has not taken a Cardizem today. Her stomach usually starts hurting after taking the medication. Her PCP has changed medications to try to help with the stomach issues but they have not helped.  "Per her pharmacy 1-2% of patients can have GI effects due to Cardizem." "I think it's the Cardizem that is messing with my stomach." Will relay information to Dr. Servando Salina for advise. Patient verbalized understanding. No questions or concerns expressed at this time.

## 2021-04-28 NOTE — Telephone Encounter (Signed)
Called patient to give her Dr. Mallory Shirk recommendation. She states "I took one today and it did not hurt my stomach. So I will just try to take it. If it starts to hurt again I'll call ya'll back and we can change it then."

## 2021-05-13 ENCOUNTER — Telehealth: Payer: Self-pay | Admitting: Cardiology

## 2021-05-13 MED ORDER — CARVEDILOL 6.25 MG PO TABS: 6.2500 mg | ORAL_TABLET | Freq: Two times a day (BID) | ORAL | 3 refills | Status: DC

## 2021-05-13 NOTE — Telephone Encounter (Signed)
Patient was calling to speak with the nurse. Please advise °

## 2021-05-13 NOTE — Telephone Encounter (Signed)
Called pt to see what is going on. She states her stomach is still hurting and she is willing to try the increased dose of Coreg 6.25 mg twice daily. Pt verbalized understanding, pescription sent to pharmacy.

## 2021-06-06 IMAGING — CT CT HEART MORP W/ CTA COR W/ SCORE W/ CA W/CM &/OR W/O CM
4 of 7 series · 8 of 20 positions shown, 9 images · IV contrast (APPLIED)
Comparison: None.
COMPARISON: None.

Addendum:
EXAM:
OVER-READ INTERPRETATION  CT CHEST

The following report is an over-read performed by radiologist Dr.
over-read does not include interpretation of cardiac or coronary
anatomy or pathology. The coronary CTA interpretation by the
cardiologist is attached.
TECHNIQUE: The patient was scanned on a Phillips Force scanner.

[Series 6: best diast 80 % · axial · 0.39mm/px · z∈[+32,+82]mm · 2 of 372 slices shown, 3 images]
[im 124/372  vessel]
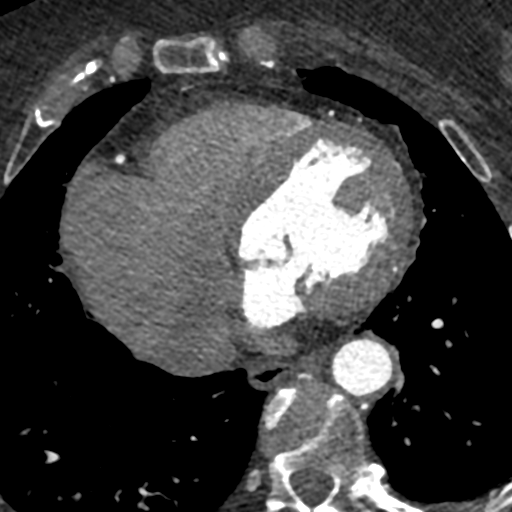
[im 124/372  lung]
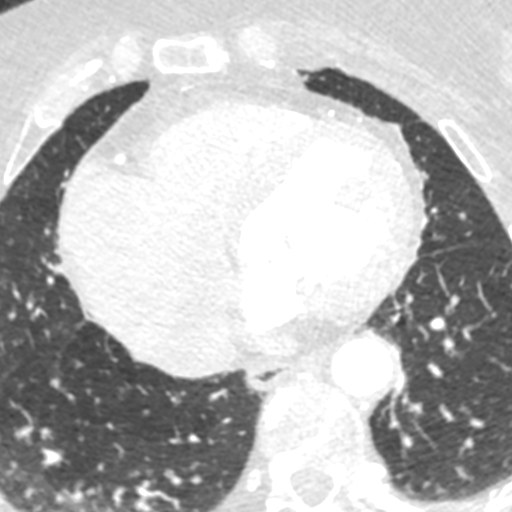
[im 248/372  vessel]
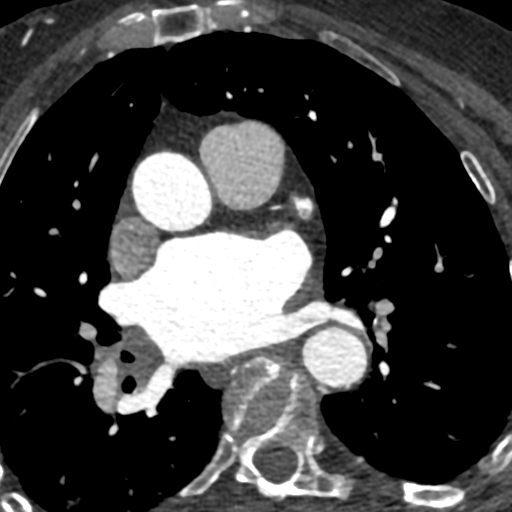

[Series 7: best syst 45 % · axial · 0.39mm/px · z∈[+32,+82]mm · 2 of 372 slices shown]
[im 124/372  vessel]
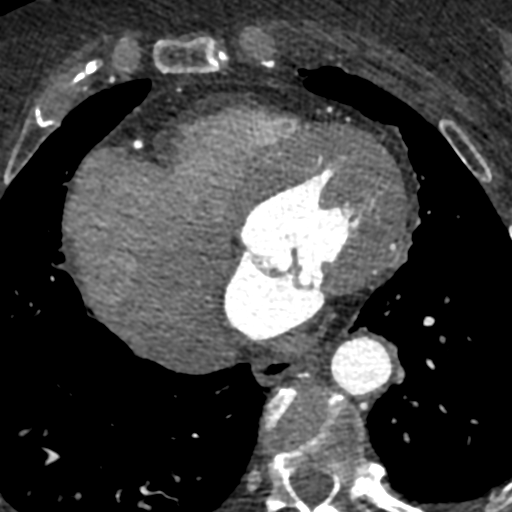
[im 248/372  vessel]
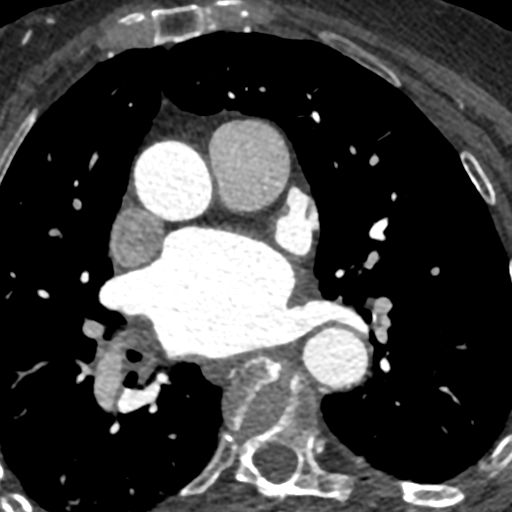

[Series 8: ts diast sharp 45 % · axial · 0.39mm/px · z∈[+32,+82]mm · 2 of 372 slices shown]
[im 124/372  lung]
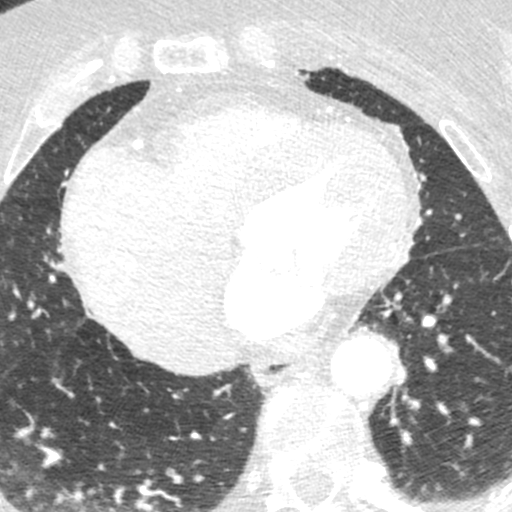
[im 248/372  lung]
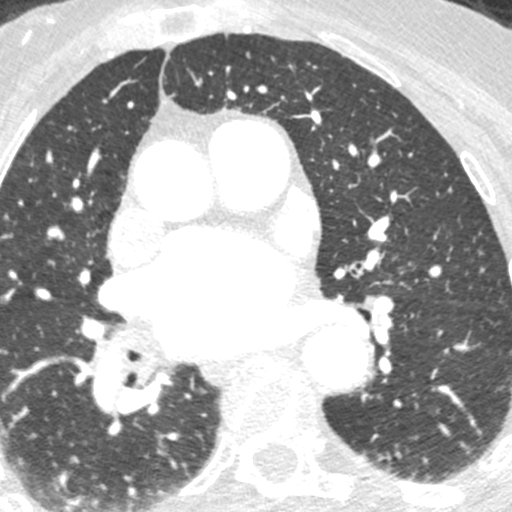

[Series 9: ts syst sharp 45 % · axial · 0.39mm/px · z∈[+32,+82]mm · 2 of 372 slices shown]
[im 124/372  lung]
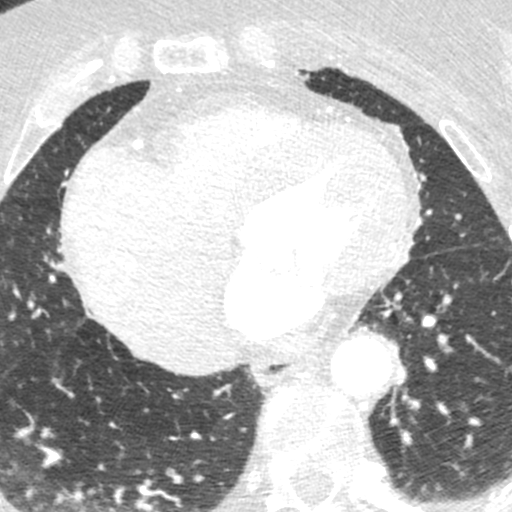
[im 248/372  lung]
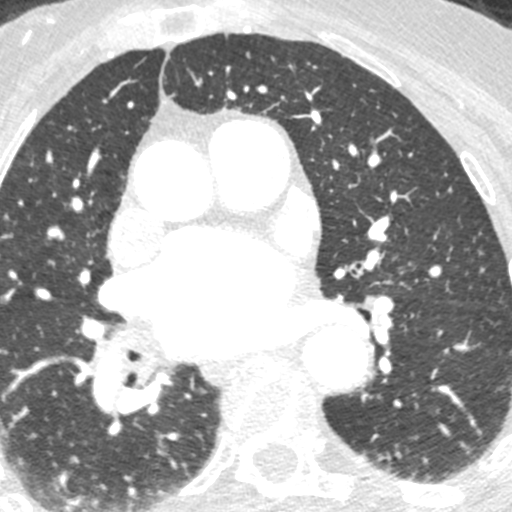

[8 of 20 positions shown; findings below may reference images not displayed]

FINDINGS: Vascular: No incidental findings.

Mediastinum/Nodes: Visualized mediastinum and hilar regions
demonstrate no enlarged lymph nodes. There is one precarinal node
visualized with short axis measurement of 10 mm.

Lungs/Pleura: Mild scarring present at both lung bases. Visualized
lungs show no evidence of pulmonary edema, consolidation,
pneumothorax, nodule or pleural fluid.

Upper Abdomen: No acute abnormality.

Musculoskeletal: No chest wall mass or suspicious bone lesions
identified.
IMPRESSION: Single nonenlarged precarinal lymph node. No other significant
findings.

EXAM:
Cardiac/Coronary  CT
FINDINGS: A 120 kV prospective scan was triggered in the descending thoracic
aorta at 111 HU's. Axial non-contrast 3 mm slices were carried out
through the heart. The data set was analyzed on a dedicated work
station and scored using the Agatson method. Gantry rotation speed
was 250 msecs and collimation was .6 mm. No beta blockade and 0.8 mg
of sl NTG was given. The 3D data set was reconstructed in 5%
intervals of the 67-82 % of the R-R cycle. Diastolic phases were
analyzed on a dedicated work station using MPR, MIP and VRT modes.
The patient received 80 cc of contrast.

Aorta: Normal size. Scattered calcifcations in the ascending and
descending aorta. No dissection.

Aortic Valve:  Trileaflet.  No calcifications.

Coronary Arteries:  Normal coronary origin.  Right dominance.

RCA is a large dominant artery that gives rise to PDA and PLVB.
There is mild calcified plaque in the proximal RCA with associated
stenosis of 25-49%.

Left main is a large artery that gives rise to LAD and LCX arteries.
There is no plaque.

LAD is a large vessel that gives rise to a small D1 and D2. There is
mild calcified plaque in the proximal LAD with associated stenosis
of 25-49% and moderate calcified plaque in the mid LAD with
associated stenosis of 50-69%.

LCX is a non-dominant artery that gives rise to one small OM1
branch. There is mild calcified plaque in the ostial and mid LCx
with associated stenosis of 25-49%.

Other findings:

Normal pulmonary vein drainage into the left atrium.

Normal let atrial appendage without a thrombus.

Normal size of the pulmonary artery.
IMPRESSION: 1. Coronary calcium score of 329. This was 65th percentile for age
and sex matched control.

2.   Normal coronary origin with right dominance.

3.   Moderate atherosclerosis.  CAD-RADS 3.

4. Consider symptom-guided anti-ischemic and preventive
pharmacotherapy as well as risk factor modification per
guideline-directed care.

5.  This study has been submitted for FFR analysis.

Ishrat Burciaga

*** End of Addendum ***
EXAM:
OVER-READ INTERPRETATION  CT CHEST

The following report is an over-read performed by radiologist Dr.
over-read does not include interpretation of cardiac or coronary
anatomy or pathology. The coronary CTA interpretation by the
cardiologist is attached.
FINDINGS: Vascular: No incidental findings.

Mediastinum/Nodes: Visualized mediastinum and hilar regions
demonstrate no enlarged lymph nodes. There is one precarinal node
visualized with short axis measurement of 10 mm.

Lungs/Pleura: Mild scarring present at both lung bases. Visualized
lungs show no evidence of pulmonary edema, consolidation,
pneumothorax, nodule or pleural fluid.

Upper Abdomen: No acute abnormality.

Musculoskeletal: No chest wall mass or suspicious bone lesions
identified.
IMPRESSION: Single nonenlarged precarinal lymph node. No other significant
findings.

## 2021-06-08 ENCOUNTER — Other Ambulatory Visit: Payer: Self-pay

## 2021-06-09 ENCOUNTER — Ambulatory Visit: Payer: Medicare PPO | Admitting: Cardiology

## 2021-06-09 ENCOUNTER — Other Ambulatory Visit: Payer: Self-pay

## 2021-06-09 ENCOUNTER — Encounter: Payer: Self-pay | Admitting: Cardiology

## 2021-06-09 VITALS — BP 122/60 | HR 90 | Ht 70.0 in | Wt 153.8 lb

## 2021-06-09 DIAGNOSIS — I10 Essential (primary) hypertension: Secondary | ICD-10-CM | POA: Diagnosis not present

## 2021-06-09 DIAGNOSIS — I130 Hypertensive heart and chronic kidney disease with heart failure and stage 1 through stage 4 chronic kidney disease, or unspecified chronic kidney disease: Secondary | ICD-10-CM

## 2021-06-09 DIAGNOSIS — I493 Ventricular premature depolarization: Secondary | ICD-10-CM | POA: Diagnosis not present

## 2021-06-09 DIAGNOSIS — I480 Paroxysmal atrial fibrillation: Secondary | ICD-10-CM

## 2021-06-09 DIAGNOSIS — I5032 Chronic diastolic (congestive) heart failure: Secondary | ICD-10-CM

## 2021-06-09 DIAGNOSIS — I340 Nonrheumatic mitral (valve) insufficiency: Secondary | ICD-10-CM

## 2021-06-09 NOTE — Patient Instructions (Signed)
Medication Instructions:  Your physician recommends that you continue on your current medications as directed. Please refer to the Current Medication list given to you today.  *If you need a refill on your cardiac medications before your next appointment, please call your pharmacy*   Lab Work: Your physician recommends that you have labs done in the office today. Your test included  basic metabolic panel and magnesium.  If you have labs (blood work) drawn today and your tests are completely normal, you will receive your results only by: MyChart Message (if you have MyChart) OR A paper copy in the mail If you have any lab test that is abnormal or we need to change your treatment, we will call you to review the results.   Testing/Procedures: None ordered   Follow-Up: At [facility], you and your health needs are our priority.  As part of our continuing mission to provide you with exceptional heart care, we have created designated Provider Care Teams.  These Care Teams include your primary Cardiologist (physician) and Advanced Practice Providers (APPs -  Physician Assistants and Nurse Practitioners) who all work together to provide you with the care you need, when you need it.  We recommend signing up for the patient portal called "MyChart".  Sign up information is provided on this After Visit Summary.  MyChart is used to connect with patients for Virtual Visits (Telemedicine).  Patients are able to view lab/test results, encounter notes, upcoming appointments, etc.  Non-urgent messages can be sent to your provider as well.   To learn more about what you can do with MyChart, go to [email or web address].    Your next appointment:   6 month(s)  The format for your next appointment:   In Person  Provider:   Thomasene Ripple, MD   Other Instructions NA

## 2021-06-09 NOTE — Progress Notes (Signed)
Cardiology Office Note:    Date:  06/09/2021   ID:  [name], DOB 1932/05/06, MRN [id number]  PCP:  Paulina Fusi, MD  Cardiologist:  Thomasene Ripple, DO  Electrophysiologist:  None   Referring MD: Paulina Fusi, MD    History of Present Illness:    [name] is a 87 y.o. female with a hx of EF 65%, grade 2 diastolic dysfunction, persistent atrial fibrillation on EliquisIs here today for follow-up visit.  Did see the patient in December 2021 at that time we increased her Lasix to 40 mg in the a.m. and 20 mg in the evening for 3 days.   I saw the patient on December 29, 2020 at that time I increased her Lasix to 40 mg twice a day with potassium supplement.  I reviewed her echocardiogram which was done at [facility] showing EF 60 to 65% with moderate TR as well as moderate regurgitation.  We continued her Eliquis and her Cardizem for A. fib.   I saw the patient in March 09, 2021 at that time no medication changes were made.  We talked about her pulmonary hypertension and what it means.  She had no questions at that time  She is here today for follow-up visit.  She has been doing well she tells me.  She noticed recently that she is having transient blurriness.  She has an appointment set up with the ophthalmologist.  No other complaints at this time.  She has not had any hospitalizations or ED visits since I last saw her.  Past Medical History:  Diagnosis Date   Abnormal nuclear stress test 11/15/2019   Abnormal perfusion scintigraphy 07/22/2014   Lexiscan MPS Jan 2012 with mild mis and apical anterior ischemia EF 59% she was seen at [facility] Lexiscan MPS Jan 2012 with mild mis and apical anterior ischemia EF 59% she was seen at [facility] of this note might be different from the original. Overview:  Lexiscan MPS Jan 2012 with mild mis and apical anterior ischemia EF 59% she was seen at [facility] of this note might be different from   Abnormal results of function  studies of other organs and systems 07/22/2014   Lexiscan MPS Jan 2012 with mild mis and apical anterior ischemia EF 59% she was seen at [facility] of this note might be different from the original. Overview:  Lexiscan MPS Jan 2012 with mild mis and apical anterior ischemia EF 59% she was seen at [facility]   Acute cystitis with hematuria 11/15/2014   Anxiety disorder 07/22/2014   Atrial fibrillation with rapid ventricular response (HCC) 06/18/2020   Atrial fibrillation, currently in sinus rhythm 07/29/2020   B12 deficiency 07/22/2014   Benign hypertensive heart and kidney disease with chronic kidney disease 07/22/2014   Bilateral leg edema 02/20/2021   CAD (coronary artery disease) 01/02/2020   Cat bite 06/20/2020   Chest tightness 09/10/2019   Chronic diastolic heart failure (HCC) 06/24/2020   Chronic heart failure with preserved ejection fraction (HCC) 06/24/2020   Chronic kidney disease, stage 2 (mild) 07/22/2014   Chronic kidney disease, stage 3a (HCC) 07/29/2020   Degeneration, intervertebral disc, cervical 07/22/2014   Depression, major, single episode, moderate (HCC) 07/22/2014   Drug rash 07/22/2014   Drug therapy 12/11/2015   Dyspnea on exertion 09/10/2019   Essential hypertension 09/10/2019   Fall at home 07/17/2018   Fatigue 07/22/2014   Former smoker 10/20/2017   Gout 07/22/2014   Gross  hematuria 10/08/2014   History of cardioversion 07/01/2020   Hypercholesterolemia 07/22/2014   Hypertensive heart and kidney disease with heart failure (HCC) 07/22/2014   Hypertonicity of bladder 07/22/2014   Hypothyroidism    Hypothyroidism (acquired) 07/22/2014   Qualifier: Diagnosis of  By: Everardo All MD, Cleophas Dunker  Formatting of this note might be different from the original. Overview:  Qualifier: Diagnosis of  By: Everardo All MD, Shaune Pascal, POST-RADIATION 06/11/2009   Qualifier: Diagnosis of  By: Everardo All MD, Gregary Signs A    Idiopathic chronic gout of multiple sites without tophus 12/11/2015   Idiopathic  peripheral neuropathy 12/11/2015   Increased frequency of urination 07/22/2014   Left atrial enlargement 07/22/2014   mild  Formatting of this note might be different from the original. mild   Lethargy 07/22/2014   Localized swelling of both lower legs 06/20/2020   Low back pain 07/22/2014   Mitral regurgitation 09/10/2019   Moderate tricuspid regurgitation 06/18/2020   Muscular deconditioning 07/29/2020   Nonrheumatic tricuspid valve regurgitation 02/20/2021   Obesity 07/22/2014   Onychomycosis due to dermatophyte 07/22/2014   Osteoarthritis, generalized 07/22/2014   Osteopenia 07/22/2014   Osteoporosis 08/24/2018   DEXA 08/23/2018  Formatting of this note might be different from the original. DEXA 08/23/2018   PAF (paroxysmal atrial fibrillation) (HCC)    Palpitations 09/10/2019   PMB (postmenopausal bleeding) 02/20/2021   Primary hypertension 07/18/2020   Pulmonary hypertension (HCC) 12/08/2017   PVC (premature ventricular contraction) 04/17/2018   RBBB 10/20/2017   Recurrent UTI 12/11/2015   S/P total knee arthroplasty 07/29/2020   Sciatica 07/22/2014   Spinal stenosis of lumbar region 12/11/2015   Stenosis of cervix 02/20/2021   TMJ arthralgia 07/22/2014   Unspecified abnormalities of gait and mobility 07/22/2014   Urge incontinence of urine 07/22/2014   Wears 8 extra absorbent pads during day an on overnight pad at night  Formatting of this note might be different from the original. Wears 8 extra absorbent pads during day an on overnight pad at night   URINARY INCONTINENCE 06/11/2009   Qualifier: Diagnosis of  By: Everardo All MD, Sean A    Urinary tract infection, site not specified 04/04/2014   Varicose veins 07/22/2014   Venous insufficiency 07/22/2014   Venous stasis dermatitis of both lower extremities 07/22/2014   Vitamin D deficiency 07/22/2014    Past Surgical History:  Procedure Laterality Date   BACK SURGERY     CARDIOVERSION N/A 07/01/2020   Procedure: CARDIOVERSION;  Surgeon: Lewayne Bunting, MD;  Location: [facility] ENDOSCOPY;  Service: Cardiovascular;  Laterality: N/A;   JOINT REPLACEMENT     hip and knee   TEE WITHOUT CARDIOVERSION N/A 07/01/2020   Procedure: TRANSESOPHAGEAL ECHOCARDIOGRAM (TEE);  Surgeon: Lewayne Bunting, MD;  Location: [facility] ENDOSCOPY;  Service: Cardiovascular;  Laterality: N/A;    Current Medications: Current Meds  Medication Sig   acetaminophen (TYLENOL) 500 MG tablet Take 1,000 mg by mouth every 6 (six) hours as needed for mild pain.   atorvastatin (LIPITOR) 10 MG tablet Take 10 mg by mouth daily.   carvedilol (COREG) 6.25 MG tablet Take 1 tablet (6.25 mg total) by mouth 2 (two) times daily.   cholecalciferol (VITAMIN D) 1000 UNITS tablet Take 1,000 Units by mouth daily.   ELIQUIS 5 MG TABS tablet Take 1 tablet (5 mg total) by mouth 2 (two) times daily.   febuxostat (ULORIC) 40 MG tablet Take 40 mg by mouth daily.   furosemide (LASIX) 40 MG tablet Take 40  mg by mouth 2 (two) times daily.   hydrocortisone cream 1 % Apply 1 application topically 2 (two) times daily. To back until rash resolved   levothyroxine (SYNTHROID) 100 MCG tablet Take 1 tablet (100 mcg total) by mouth daily.   telmisartan (MICARDIS) 20 MG tablet Take 1 tablet (20 mg total) by mouth daily.   vitamin B-12 (CYANOCOBALAMIN) 1000 MCG tablet Take 1,000 mcg by mouth daily.     Allergies:   Isosorbide nitrate, Cephalexin, Gentamicin, Penicillins, and Sulfa antibiotics   Social History   Socioeconomic History   Marital status: Widowed    Spouse name: Not on file   Number of children: Not on file   Years of education: Not on file   Highest education level: Not on file  Occupational History   Not on file  Tobacco Use   Smoking status: Never   Smokeless tobacco: Never  Substance and Sexual Activity   Alcohol use: No   Drug use: No   Sexual activity: Not on file  Other Topics Concern   Not on file  Social History Narrative   Not on file   Social Determinants of Health    Financial Resource Strain: Not on file  Food Insecurity: Not on file  Transportation Needs: Not on file  Physical Activity: Not on file  Stress: Not on file  Social Connections: Not on file     Family History: The patient's family history includes Cancer in her mother; Emphysema in her brother; Heart attack in her brother; Heart disease in her brother; Hypertension in her mother; Prostate cancer in her father.  ROS:   Review of Systems  Constitution: Negative for decreased appetite, fever and weight gain.  HENT: Negative for congestion, ear discharge, hoarse voice and sore throat.   Eyes: Negative for discharge, redness, vision loss in right eye and visual halos.  Cardiovascular: Negative for chest pain, dyspnea on exertion, leg swelling, orthopnea and palpitations.  Respiratory: Negative for cough, hemoptysis, shortness of breath and snoring.   Endocrine: Negative for heat intolerance and polyphagia.  Hematologic/Lymphatic: Negative for bleeding problem. Does not bruise/bleed easily.  Skin: Negative for flushing, nail changes, rash and suspicious lesions.  Musculoskeletal: Negative for arthritis, joint pain, muscle cramps, myalgias, neck pain and stiffness.  Gastrointestinal: Negative for abdominal pain, bowel incontinence, diarrhea and excessive appetite.  Genitourinary: Negative for decreased libido, genital sores and incomplete emptying.  Neurological: Negative for brief paralysis, focal weakness, headaches and loss of balance.  Psychiatric/Behavioral: Negative for altered mental status, depression and suicidal ideas.  Allergic/Immunologic: Negative for HIV exposure and persistent infections.    EKGs/Labs/Other Studies Reviewed:    The following studies were reviewed today:   EKG: None today  Recent Labs: 06/24/2020: B Natriuretic Peptide 346.3 12/29/2020: Magnesium 1.9; TSH 8.160 02/20/2021: BUN 35; Creatinine, Ser 1.13; Hemoglobin 11.1; NT-Pro BNP 2,536; Platelets 232;  Potassium 5.0; Sodium 136  Recent Lipid Panel    Component Value Date/Time   CHOL 180 01/03/2020 0911   TRIG 57 01/03/2020 0911   HDL 74 01/03/2020 0911   CHOLHDL 2.4 01/03/2020 0911   LDLCALC 95 01/03/2020 0911    Physical Exam:    VS:  BP 122/60   Pulse 90   Ht 5\' 10"  (1.778 m)   Wt 153 lb 12.8 oz (69.8 kg)   SpO2 96%   BMI 22.07 kg/m     Wt Readings from Last 3 Encounters:  06/09/21 153 lb 12.8 oz (69.8 kg)  02/20/21 163 lb 6.4 oz (  74.1 kg)  12/29/20 169 lb 3.2 oz (76.7 kg)     GEN: Well nourished, well developed in no acute distress HEENT: Normal NECK: No JVD; No carotid bruits LYMPHATICS: No lymphadenopathy CARDIAC: S1S2 noted,RRR, no murmurs, rubs, gallops RESPIRATORY:  Clear to auscultation without rales, wheezing or rhonchi  ABDOMEN: Soft, non-tender, non-distended, +bowel sounds, no guarding. EXTREMITIES: No edema, No cyanosis, no clubbing MUSCULOSKELETAL:  No deformity  SKIN: Warm and dry NEUROLOGIC:  Alert and oriented x 3, non-focal PSYCHIATRIC:  Normal affect, good insight  ASSESSMENT:    1. Essential hypertension   2. Nonrheumatic mitral valve regurgitation   3. Hypertensive heart and kidney disease with heart failure (HCC)   4. PVC (premature ventricular contraction)   5. PAF (paroxysmal atrial fibrillation) (HCC)   6. Chronic heart failure with preserved ejection fraction (HCC)    PLAN:    She appears to be doing well from a cardiovascular standpoint.  We will continue patient on current medication regimen.  We will get blood work today to get BMP and mag.  She will continue her anticoagulation for her atrial fibrillation. She plans to see me in follow-up in our [place] line office.  The patient is in agreement with the above plan. The patient left the office in stable condition.  The patient will follow up in 6 months or sooner if needed.  Medication Adjustments/Labs and Tests Ordered: Current medicines are reviewed at length with the patient  today.  Concerns regarding medicines are outlined above.  Orders Placed This Encounter  Procedures   Basic metabolic panel   Magnesium    No orders of the defined types were placed in this encounter.   Patient Instructions  Medication Instructions:  Your physician recommends that you continue on your current medications as directed. Please refer to the Current Medication list given to you today.  *If you need a refill on your cardiac medications before your next appointment, please call your pharmacy*   Lab Work: Your physician recommends that you have labs done in the office today. Your test included  basic metabolic panel and magnesium.  If you have labs (blood work) drawn today and your tests are completely normal, you will receive your results only by: MyChart Message (if you have MyChart) OR A paper copy in the mail If you have any lab test that is abnormal or we need to change your treatment, we will call you to review the results.   Testing/Procedures: None ordered   Follow-Up: At [facility], you and your health needs are our priority.  As part of our continuing mission to provide you with exceptional heart care, we have created designated Provider Care Teams.  These Care Teams include your primary Cardiologist (physician) and Advanced Practice Providers (APPs -  Physician Assistants and Nurse Practitioners) who all work together to provide you with the care you need, when you need it.  We recommend signing up for the patient portal called "MyChart".  Sign up information is provided on this After Visit Summary.  MyChart is used to connect with patients for Virtual Visits (Telemedicine).  Patients are able to view lab/test results, encounter notes, upcoming appointments, etc.  Non-urgent messages can be sent to your provider as well.   To learn more about what you can do with MyChart, go to [email or web address].    Your next appointment:   6 month(s)  The format for  your next appointment:   In Person  Provider:   Thomasene Ripple, MD  Other Instructions NA     Adopting a Healthy Lifestyle.  Know what a healthy weight is for you (roughly BMI <25) and aim to maintain this   Aim for 7+ servings of fruits and vegetables daily   65-80+ fluid ounces of water or unsweet tea for healthy kidneys   Limit to max 1 drink of alcohol per day; avoid smoking/tobacco   Limit animal fats in diet for cholesterol and heart health - choose grass fed whenever available   Avoid highly processed foods, and foods high in saturated/trans fats   Aim for low stress - take time to unwind and care for your mental health   Aim for 150 min of moderate intensity exercise weekly for heart health, and weights twice weekly for bone health   Aim for 7-9 hours of sleep daily   When it comes to diets, agreement about the perfect plan isnt easy to find, even among the experts. Experts at the [facility] of [employer] developed an idea known as the Healthy Eating Plate. Just imagine a plate divided into logical, healthy portions.   The emphasis is on diet quality:   Load up on vegetables and fruits - one-half of your plate: Aim for color and variety, and remember that potatoes dont count.   Go for whole grains - one-quarter of your plate: Whole wheat, barley, wheat berries, quinoa, oats, brown rice, and foods made with them. If you want pasta, go with whole wheat pasta.   Protein power - one-quarter of your plate: Fish, chicken, beans, and nuts are all healthy, versatile protein sources. Limit red meat.   The diet, however, does go beyond the plate, offering a few other suggestions.   Use healthy plant oils, such as olive, canola, soy, corn, sunflower and peanut. Check the labels, and avoid partially hydrogenated oil, which have unhealthy trans fats.   If youre thirsty, drink water. Coffee and tea are good in moderation, but skip sugary drinks and limit milk and dairy  products to one or two daily servings.   The type of carbohydrate in the diet is more important than the amount. Some sources of carbohydrates, such as vegetables, fruits, whole grains, and beans-are healthier than others.   Finally, stay active  Signed, Thomasene Ripple, DO  06/09/2021 11:34 AM    [facility]

## 2021-06-10 LAB — BASIC METABOLIC PANEL
BUN/Creatinine Ratio: 51 — ABNORMAL HIGH (ref 12–28)
BUN: 68 mg/dL — ABNORMAL HIGH (ref 8–27)
CO2: 24 mmol/L (ref 20–29)
Calcium: 9 mg/dL (ref 8.7–10.3)
Chloride: 98 mmol/L (ref 96–106)
Creatinine, Ser: 1.33 mg/dL — ABNORMAL HIGH (ref 0.57–1.00)
Glucose: 97 mg/dL (ref 65–99)
Potassium: 4.4 mmol/L (ref 3.5–5.2)
Sodium: 138 mmol/L (ref 134–144)
eGFR: 39 mL/min/{1.73_m2} — ABNORMAL LOW (ref >59)

## 2021-06-10 LAB — MAGNESIUM: Magnesium: 2.1 mg/dL (ref 1.6–2.3)

## 2021-06-16 ENCOUNTER — Ambulatory Visit: Payer: Medicare PPO | Admitting: Cardiology

## 2021-06-18 ENCOUNTER — Ambulatory Visit: Payer: Medicare PPO | Admitting: Cardiology

## 2021-07-01 DIAGNOSIS — R1111 Vomiting without nausea: Secondary | ICD-10-CM | POA: Diagnosis not present

## 2021-07-01 DIAGNOSIS — I4891 Unspecified atrial fibrillation: Secondary | ICD-10-CM | POA: Diagnosis not present

## 2021-07-01 DIAGNOSIS — I10 Essential (primary) hypertension: Secondary | ICD-10-CM | POA: Diagnosis not present

## 2021-07-01 DIAGNOSIS — R188 Other ascites: Secondary | ICD-10-CM | POA: Diagnosis not present

## 2021-07-01 DIAGNOSIS — Z515 Encounter for palliative care: Secondary | ICD-10-CM | POA: Diagnosis not present

## 2021-07-01 DIAGNOSIS — R6520 Severe sepsis without septic shock: Secondary | ICD-10-CM | POA: Diagnosis not present

## 2021-07-01 DIAGNOSIS — R2689 Other abnormalities of gait and mobility: Secondary | ICD-10-CM | POA: Diagnosis not present

## 2021-07-01 DIAGNOSIS — N1330 Unspecified hydronephrosis: Secondary | ICD-10-CM | POA: Diagnosis not present

## 2021-07-01 DIAGNOSIS — Z743 Need for continuous supervision: Secondary | ICD-10-CM | POA: Diagnosis not present

## 2021-07-01 DIAGNOSIS — N135 Crossing vessel and stricture of ureter without hydronephrosis: Secondary | ICD-10-CM | POA: Diagnosis not present

## 2021-07-01 DIAGNOSIS — J189 Pneumonia, unspecified organism: Secondary | ICD-10-CM | POA: Diagnosis not present

## 2021-07-01 DIAGNOSIS — D72829 Elevated white blood cell count, unspecified: Secondary | ICD-10-CM | POA: Diagnosis not present

## 2021-07-01 DIAGNOSIS — R6521 Severe sepsis with septic shock: Secondary | ICD-10-CM | POA: Diagnosis not present

## 2021-07-01 DIAGNOSIS — E785 Hyperlipidemia, unspecified: Secondary | ICD-10-CM | POA: Diagnosis not present

## 2021-07-01 DIAGNOSIS — N132 Hydronephrosis with renal and ureteral calculous obstruction: Secondary | ICD-10-CM | POA: Diagnosis not present

## 2021-07-01 DIAGNOSIS — R29898 Other symptoms and signs involving the musculoskeletal system: Secondary | ICD-10-CM | POA: Diagnosis not present

## 2021-07-01 DIAGNOSIS — N3000 Acute cystitis without hematuria: Secondary | ICD-10-CM | POA: Diagnosis not present

## 2021-07-01 DIAGNOSIS — I77811 Abdominal aortic ectasia: Secondary | ICD-10-CM | POA: Diagnosis not present

## 2021-07-01 DIAGNOSIS — R0902 Hypoxemia: Secondary | ICD-10-CM | POA: Diagnosis not present

## 2021-07-01 DIAGNOSIS — M6281 Muscle weakness (generalized): Secondary | ICD-10-CM | POA: Diagnosis not present

## 2021-07-01 DIAGNOSIS — R54 Age-related physical debility: Secondary | ICD-10-CM | POA: Diagnosis not present

## 2021-07-01 DIAGNOSIS — I130 Hypertensive heart and chronic kidney disease with heart failure and stage 1 through stage 4 chronic kidney disease, or unspecified chronic kidney disease: Secondary | ICD-10-CM | POA: Diagnosis not present

## 2021-07-01 DIAGNOSIS — N130 Hydronephrosis with ureteropelvic junction obstruction: Secondary | ICD-10-CM | POA: Diagnosis not present

## 2021-07-01 DIAGNOSIS — R1084 Generalized abdominal pain: Secondary | ICD-10-CM | POA: Diagnosis not present

## 2021-07-01 DIAGNOSIS — A4151 Sepsis due to Escherichia coli [E. coli]: Secondary | ICD-10-CM | POA: Diagnosis not present

## 2021-07-01 DIAGNOSIS — Z66 Do not resuscitate: Secondary | ICD-10-CM | POA: Diagnosis not present

## 2021-07-01 DIAGNOSIS — M4186 Other forms of scoliosis, lumbar region: Secondary | ICD-10-CM | POA: Diagnosis not present

## 2021-07-01 DIAGNOSIS — M47816 Spondylosis without myelopathy or radiculopathy, lumbar region: Secondary | ICD-10-CM | POA: Diagnosis not present

## 2021-07-01 DIAGNOSIS — R112 Nausea with vomiting, unspecified: Secondary | ICD-10-CM | POA: Diagnosis not present

## 2021-07-01 DIAGNOSIS — I5030 Unspecified diastolic (congestive) heart failure: Secondary | ICD-10-CM | POA: Diagnosis not present

## 2021-07-01 DIAGNOSIS — I081 Rheumatic disorders of both mitral and tricuspid valves: Secondary | ICD-10-CM | POA: Diagnosis not present

## 2021-07-01 DIAGNOSIS — I5033 Acute on chronic diastolic (congestive) heart failure: Secondary | ICD-10-CM | POA: Diagnosis not present

## 2021-07-01 DIAGNOSIS — I509 Heart failure, unspecified: Secondary | ICD-10-CM | POA: Diagnosis not present

## 2021-07-01 DIAGNOSIS — R110 Nausea: Secondary | ICD-10-CM | POA: Diagnosis not present

## 2021-07-01 DIAGNOSIS — I5040 Unspecified combined systolic (congestive) and diastolic (congestive) heart failure: Secondary | ICD-10-CM | POA: Diagnosis not present

## 2021-07-01 DIAGNOSIS — N136 Pyonephrosis: Secondary | ICD-10-CM | POA: Diagnosis not present

## 2021-07-01 DIAGNOSIS — E039 Hypothyroidism, unspecified: Secondary | ICD-10-CM | POA: Diagnosis not present

## 2021-07-01 DIAGNOSIS — N10 Acute tubulo-interstitial nephritis: Secondary | ICD-10-CM | POA: Diagnosis not present

## 2021-07-01 DIAGNOSIS — K449 Diaphragmatic hernia without obstruction or gangrene: Secondary | ICD-10-CM | POA: Diagnosis not present

## 2021-07-01 DIAGNOSIS — M810 Age-related osteoporosis without current pathological fracture: Secondary | ICD-10-CM | POA: Diagnosis not present

## 2021-07-01 DIAGNOSIS — N179 Acute kidney failure, unspecified: Secondary | ICD-10-CM | POA: Diagnosis not present

## 2021-07-01 DIAGNOSIS — R531 Weakness: Secondary | ICD-10-CM | POA: Diagnosis not present

## 2021-07-01 DIAGNOSIS — G9389 Other specified disorders of brain: Secondary | ICD-10-CM | POA: Diagnosis not present

## 2021-07-01 DIAGNOSIS — Z7189 Other specified counseling: Secondary | ICD-10-CM | POA: Diagnosis not present

## 2021-07-01 DIAGNOSIS — I272 Pulmonary hypertension, unspecified: Secondary | ICD-10-CM | POA: Diagnosis not present

## 2021-07-01 DIAGNOSIS — N2889 Other specified disorders of kidney and ureter: Secondary | ICD-10-CM | POA: Diagnosis not present

## 2021-07-01 DIAGNOSIS — A419 Sepsis, unspecified organism: Secondary | ICD-10-CM | POA: Diagnosis not present

## 2021-07-01 DIAGNOSIS — R059 Cough, unspecified: Secondary | ICD-10-CM | POA: Diagnosis not present

## 2021-07-14 DIAGNOSIS — I110 Hypertensive heart disease with heart failure: Secondary | ICD-10-CM | POA: Diagnosis not present

## 2021-07-14 DIAGNOSIS — I10 Essential (primary) hypertension: Secondary | ICD-10-CM | POA: Diagnosis not present

## 2021-07-14 DIAGNOSIS — R29898 Other symptoms and signs involving the musculoskeletal system: Secondary | ICD-10-CM | POA: Diagnosis not present

## 2021-07-14 DIAGNOSIS — E039 Hypothyroidism, unspecified: Secondary | ICD-10-CM | POA: Diagnosis not present

## 2021-07-14 DIAGNOSIS — I5040 Unspecified combined systolic (congestive) and diastolic (congestive) heart failure: Secondary | ICD-10-CM | POA: Diagnosis not present

## 2021-07-14 DIAGNOSIS — E785 Hyperlipidemia, unspecified: Secondary | ICD-10-CM | POA: Diagnosis not present

## 2021-07-14 DIAGNOSIS — M810 Age-related osteoporosis without current pathological fracture: Secondary | ICD-10-CM | POA: Diagnosis not present

## 2021-07-14 DIAGNOSIS — N179 Acute kidney failure, unspecified: Secondary | ICD-10-CM | POA: Diagnosis not present

## 2021-07-14 DIAGNOSIS — A419 Sepsis, unspecified organism: Secondary | ICD-10-CM | POA: Diagnosis not present

## 2021-07-14 DIAGNOSIS — M6281 Muscle weakness (generalized): Secondary | ICD-10-CM | POA: Diagnosis not present

## 2021-07-14 DIAGNOSIS — D528 Other folate deficiency anemias: Secondary | ICD-10-CM | POA: Diagnosis not present

## 2021-07-14 DIAGNOSIS — F4322 Adjustment disorder with anxiety: Secondary | ICD-10-CM | POA: Diagnosis not present

## 2021-07-14 DIAGNOSIS — R262 Difficulty in walking, not elsewhere classified: Secondary | ICD-10-CM | POA: Diagnosis not present

## 2021-07-14 DIAGNOSIS — N10 Acute tubulo-interstitial nephritis: Secondary | ICD-10-CM | POA: Diagnosis not present

## 2021-07-14 DIAGNOSIS — N1330 Unspecified hydronephrosis: Secondary | ICD-10-CM | POA: Diagnosis not present

## 2021-07-14 DIAGNOSIS — D649 Anemia, unspecified: Secondary | ICD-10-CM | POA: Diagnosis not present

## 2021-07-14 DIAGNOSIS — Z743 Need for continuous supervision: Secondary | ICD-10-CM | POA: Diagnosis not present

## 2021-07-14 DIAGNOSIS — N135 Crossing vessel and stricture of ureter without hydronephrosis: Secondary | ICD-10-CM | POA: Diagnosis not present

## 2021-07-14 DIAGNOSIS — R2689 Other abnormalities of gait and mobility: Secondary | ICD-10-CM | POA: Diagnosis not present

## 2021-07-14 DIAGNOSIS — L24A9 Irritant contact dermatitis due friction or contact with other specified body fluids: Secondary | ICD-10-CM | POA: Diagnosis not present

## 2021-07-14 DIAGNOSIS — R6520 Severe sepsis without septic shock: Secondary | ICD-10-CM | POA: Diagnosis not present

## 2021-07-14 DIAGNOSIS — I5032 Chronic diastolic (congestive) heart failure: Secondary | ICD-10-CM | POA: Diagnosis not present

## 2021-07-14 DIAGNOSIS — N132 Hydronephrosis with renal and ureteral calculous obstruction: Secondary | ICD-10-CM | POA: Diagnosis not present

## 2021-07-14 DIAGNOSIS — I4891 Unspecified atrial fibrillation: Secondary | ICD-10-CM | POA: Diagnosis not present

## 2021-07-16 DIAGNOSIS — R262 Difficulty in walking, not elsewhere classified: Secondary | ICD-10-CM | POA: Diagnosis not present

## 2021-07-16 DIAGNOSIS — D649 Anemia, unspecified: Secondary | ICD-10-CM | POA: Diagnosis not present

## 2021-07-16 DIAGNOSIS — I4891 Unspecified atrial fibrillation: Secondary | ICD-10-CM | POA: Diagnosis not present

## 2021-07-16 DIAGNOSIS — I5032 Chronic diastolic (congestive) heart failure: Secondary | ICD-10-CM | POA: Diagnosis not present

## 2021-07-27 ENCOUNTER — Telehealth: Payer: Self-pay | Admitting: Cardiology

## 2021-07-27 MED ORDER — FUROSEMIDE 40 MG PO TABS: 40.0000 mg | ORAL_TABLET | Freq: Every day | ORAL | Status: DC

## 2021-07-27 MED ORDER — CARVEDILOL 3.125 MG PO TABS
3.1250 mg | ORAL_TABLET | Freq: Two times a day (BID) | ORAL | Status: DC
Start: 2021-07-27 — End: 2021-07-28

## 2021-07-27 NOTE — Telephone Encounter (Signed)
Pt c/o BP issue: STAT if pt c/o blurred vision, one-sided weakness or slurred speech  1. What are your last 5 BP readings? 90/40  2. Are you having any other symptoms (ex. Dizziness, headache, blurred vision, passed out)? Really weak, lightheadedness, sleeps a lot   3. What is your BP issue? Patient is experiencing very low BP. Patient's son would like for someone to see if they could get the rehab to do blood work on patient to see why BP is so low. Please call patient's son back

## 2021-07-27 NOTE — Telephone Encounter (Signed)
Spoke with pt's son regarding low blood pressures that pt has been experiencing recently. Son states that pt was hospitalized about a month ago due to a ureteral obstruction that required intervention. Son states that she was septic at one point. Son states that it took a few weeks but she was discharged to a rehab facility [facility] nursing home, [place], [place]) where he thinks she is back on most of her medications but is unsure. Son reports blood pressures in the 90/40 range. Son states that pt has been complaining of feeling weak, dizzy and seeing spots. Pt tells her son that she feels like she has before when her blood levels have been down before. Most recent CBC on 07/14/21 shows H/H of 10.5/30.1.    Called rehab facility to confirm medication list. Updated pt's medication list in EMR. Per nurse at facility will fax over list of medications with parameters as well as blood pressure readings.   Will await fax to provide to Dr. Servando Salina. Fax received with forward to Dr. Servando Salina and nurse to advise.  To submit changes to orders via fax. Fax # [phone].

## 2021-07-28 NOTE — Telephone Encounter (Signed)
Magda Paganini, LPN returned my call. After hearing Dr. Mallory Shirk recommendation she asks for the order to be faxed to the facility.

## 2021-07-28 NOTE — Addendum Note (Signed)
Addended by: Reynolds Bowl on: 07/28/2021 04:34 PM   Modules accepted: Orders

## 2021-07-28 NOTE — Telephone Encounter (Signed)
Order faxed to [facility]. See message under letter tab.

## 2021-07-28 NOTE — Telephone Encounter (Signed)
Called pt's son to update him about his mother. He asked about her HBG. I informed him of the last one drawn. He is concerned about the value. He was encouraged to reach out to the nursing home about her HBG since she told him "this is how I felt when my blood levels were low last time."

## 2021-07-28 NOTE — Telephone Encounter (Signed)
Called to let the nursing staff know Dr. Servando Salina wants to discontinue Carvedilol (Coreg) for this pt. No answer at this time. Left message for her to return the call.

## 2021-07-29 DIAGNOSIS — L24A9 Irritant contact dermatitis due friction or contact with other specified body fluids: Secondary | ICD-10-CM | POA: Diagnosis not present

## 2021-08-06 DIAGNOSIS — Z515 Encounter for palliative care: Secondary | ICD-10-CM | POA: Diagnosis not present

## 2021-08-06 DIAGNOSIS — R8290 Unspecified abnormal findings in urine: Secondary | ICD-10-CM | POA: Diagnosis not present

## 2021-08-06 DIAGNOSIS — R54 Age-related physical debility: Secondary | ICD-10-CM | POA: Diagnosis not present

## 2021-08-06 DIAGNOSIS — N135 Crossing vessel and stricture of ureter without hydronephrosis: Secondary | ICD-10-CM | POA: Diagnosis not present

## 2021-08-06 DIAGNOSIS — N10 Acute tubulo-interstitial nephritis: Secondary | ICD-10-CM | POA: Diagnosis not present

## 2021-08-11 DIAGNOSIS — N10 Acute tubulo-interstitial nephritis: Secondary | ICD-10-CM | POA: Diagnosis not present

## 2021-08-11 DIAGNOSIS — A419 Sepsis, unspecified organism: Secondary | ICD-10-CM | POA: Diagnosis not present

## 2021-08-11 DIAGNOSIS — I5033 Acute on chronic diastolic (congestive) heart failure: Secondary | ICD-10-CM | POA: Diagnosis not present

## 2021-08-11 DIAGNOSIS — I4891 Unspecified atrial fibrillation: Secondary | ICD-10-CM | POA: Diagnosis not present

## 2021-08-11 DIAGNOSIS — N201 Calculus of ureter: Secondary | ICD-10-CM | POA: Diagnosis not present

## 2021-08-17 DIAGNOSIS — R7989 Other specified abnormal findings of blood chemistry: Secondary | ICD-10-CM | POA: Diagnosis not present

## 2021-08-25 DIAGNOSIS — I4891 Unspecified atrial fibrillation: Secondary | ICD-10-CM | POA: Diagnosis not present

## 2021-08-25 DIAGNOSIS — Z79890 Hormone replacement therapy (postmenopausal): Secondary | ICD-10-CM | POA: Diagnosis not present

## 2021-08-25 DIAGNOSIS — Z7901 Long term (current) use of anticoagulants: Secondary | ICD-10-CM | POA: Diagnosis not present

## 2021-08-25 DIAGNOSIS — Z79899 Other long term (current) drug therapy: Secondary | ICD-10-CM | POA: Diagnosis not present

## 2021-08-25 DIAGNOSIS — N135 Crossing vessel and stricture of ureter without hydronephrosis: Secondary | ICD-10-CM | POA: Diagnosis not present

## 2021-08-25 DIAGNOSIS — N131 Hydronephrosis with ureteral stricture, not elsewhere classified: Secondary | ICD-10-CM | POA: Diagnosis not present

## 2021-08-25 DIAGNOSIS — E039 Hypothyroidism, unspecified: Secondary | ICD-10-CM | POA: Diagnosis not present

## 2021-08-25 DIAGNOSIS — N130 Hydronephrosis with ureteropelvic junction obstruction: Secondary | ICD-10-CM | POA: Diagnosis not present

## 2021-08-25 DIAGNOSIS — I110 Hypertensive heart disease with heart failure: Secondary | ICD-10-CM | POA: Diagnosis not present

## 2021-08-25 DIAGNOSIS — Z466 Encounter for fitting and adjustment of urinary device: Secondary | ICD-10-CM | POA: Diagnosis not present

## 2021-08-25 DIAGNOSIS — Z880 Allergy status to penicillin: Secondary | ICD-10-CM | POA: Diagnosis not present

## 2021-08-25 DIAGNOSIS — Z882 Allergy status to sulfonamides status: Secondary | ICD-10-CM | POA: Diagnosis not present

## 2021-08-25 DIAGNOSIS — I509 Heart failure, unspecified: Secondary | ICD-10-CM | POA: Diagnosis not present

## 2021-09-01 DIAGNOSIS — N135 Crossing vessel and stricture of ureter without hydronephrosis: Secondary | ICD-10-CM | POA: Diagnosis not present

## 2021-09-01 DIAGNOSIS — R8290 Unspecified abnormal findings in urine: Secondary | ICD-10-CM | POA: Diagnosis not present

## 2021-09-01 DIAGNOSIS — R54 Age-related physical debility: Secondary | ICD-10-CM | POA: Diagnosis not present

## 2021-09-07 ENCOUNTER — Telehealth: Payer: Self-pay | Admitting: Cardiology

## 2021-09-07 NOTE — Telephone Encounter (Signed)
Spoke to the patient's son. He stated that the patient called his son concerned about increased swelling. He was limited with his information.  Call placed to the patient. She stated that she has been having increased swelling from her feet to her calves, bilaterally. She stated that her skin was tight. The swelling increases throughout  the day and gets better overnight. She does not add sodium to her diet and watches the amount that she eats daily. She denies shortness of breath. She elevates her legs but this is mainly at night.  She does not weight herself daily. She stated that last week she weighed 161 and then this morning she was 156. She feels like her normal weight is around 151-155.   She currently takes Furosemide 40 mg once daily. She would like to know if she should increase the dosage for a few days. Labs completed 12/06. She has been advised to try and elevate her legs during the day more when she is sitting and to weigh daily and keep a log of these weights.

## 2021-09-07 NOTE — Telephone Encounter (Signed)
Please have her increase her lasix to 40 mg twice a day. Please add her to my schedule for next week. Ok to [employer].

## 2021-09-07 NOTE — Telephone Encounter (Signed)
Pt c/o swelling: STAT is pt has developed SOB within 24 hours  If swelling, where is the swelling located? Ankles and up above knees   How much weight have you gained and in what time span? Not sure   Have you gained 3 pounds in a day or 5 pounds in a week? Not sure   Do you have a log of your daily weights (if so, list)? N/a   Are you currently taking a fluid pill? yes  Are you currently SOB? No   Have you traveled recently? no

## 2021-09-07 NOTE — Telephone Encounter (Signed)
Patient has been made aware to increase her Furosemide to 40 mg twice daily. Appointment made on 12/29 with Dr. Servando Salina.

## 2021-09-17 ENCOUNTER — Ambulatory Visit: Payer: Medicare PPO | Admitting: Cardiology

## 2021-09-17 ENCOUNTER — Other Ambulatory Visit: Payer: Self-pay

## 2021-09-17 ENCOUNTER — Encounter: Payer: Self-pay | Admitting: Cardiology

## 2021-09-17 VITALS — BP 120/66 | HR 94 | Ht 70.0 in | Wt 158.0 lb

## 2021-09-17 DIAGNOSIS — I130 Hypertensive heart and chronic kidney disease with heart failure and stage 1 through stage 4 chronic kidney disease, or unspecified chronic kidney disease: Secondary | ICD-10-CM

## 2021-09-17 DIAGNOSIS — I480 Paroxysmal atrial fibrillation: Secondary | ICD-10-CM

## 2021-09-17 DIAGNOSIS — Z79899 Other long term (current) drug therapy: Secondary | ICD-10-CM | POA: Diagnosis not present

## 2021-09-17 DIAGNOSIS — I5032 Chronic diastolic (congestive) heart failure: Secondary | ICD-10-CM

## 2021-09-17 MED ORDER — METOLAZONE 5 MG PO TABS: 5.0000 mg | ORAL_TABLET | ORAL | 0 refills | Status: DC

## 2021-09-17 MED ORDER — TORSEMIDE 20 MG PO TABS: 20.0000 mg | ORAL_TABLET | Freq: Every day | ORAL | 3 refills | Status: DC

## 2021-09-17 NOTE — Progress Notes (Signed)
Cardiology Office Note:    Date:  09/17/2021   ID:  [name], DOB [birth year], MRN [id number]  PCP:  Nicoletta Dress, MD  Cardiologist:  Berniece Salines, DO  Electrophysiologist:  None   Referring MD: Nicoletta Dress, MD   " I am experiencing leg swelling"  History of Present Illness:    [name] is a 87 y.o. female with a hx of  EF [id number], grade 2 diastolic dysfunction, persistent atrial fibrillation on EliquisIs here today for follow-up visit.  Did see the patient in December 2021 at that time we increased her Lasix to 40 mg in the a.m. and 20 mg in the evening for 3 days.   I saw the patient on December 29, 2020 at that time I increased her Lasix to 40 mg twice a day with potassium supplement.  I reviewed her echocardiogram which was done at [facility] showing EF 60 to 65% with moderate TR as well as moderate regurgitation.  We continued her Eliquis and her Cardizem for A. fib.   I saw the patient in March 09, 2021 at that time no medication changes were made.  We talked about her pulmonary hypertension and what it means.  She had no questions at that time.  I last saw the patient in June 09, 2021 at that time she appeared to be doing well from a cardiovascular standpoint.  We continue her medication with no changes.  In the meantime she had been hospitalized where she was treated for sepsis secondary to acute pyelonephritis at [place].  Since her hospitalization she has been doing well until recently she started experience significant leg swelling.  She called recently and I increase the patient's Lasix to 40 mg twice a day.  She has not been taking given the fact that she was concerned about increased urinary frequency. She is here today with her son and tells me that the leg swelling is getting too much.  She sleeps in a recliner normally so she cannot tell me if there is any significant orthopnea.  Past Medical History:  Diagnosis Date   Abnormal nuclear  stress test 11/15/2019   Abnormal perfusion scintigraphy 07/22/2014   Lexiscan MPS Jan 2012 with mild mis and apical anterior ischemia EF 59% she was seen at [place] Jan 2012 with mild mis and apical anterior ischemia EF 59% she was seen at [facility] of this note might be different from the original. Overview:  Lexiscan MPS Jan 2012 with mild mis and apical anterior ischemia EF 59% she was seen at [facility] of this note might be different from   Abnormal results of function studies of other organs and systems 07/22/2014   Lexiscan MPS Jan 2012 with mild mis and apical anterior ischemia EF 59% she was seen at [facility] of this note might be different from the original. Overview:  Lexiscan MPS Jan 2012 with mild mis and apical anterior ischemia EF 59% she was seen at [facility]   Acute cystitis with hematuria 11/15/2014   Anxiety disorder 07/22/2014   Atrial fibrillation with rapid ventricular response ([place]) 06/18/2020   Atrial fibrillation, currently in sinus rhythm 07/29/2020   B12 deficiency 07/22/2014   Benign hypertensive heart and kidney disease with chronic kidney disease 07/22/2014   Bilateral leg edema 02/20/2021   CAD (coronary artery disease) 01/02/2020   Cat bite 06/20/2020   Chest tightness 09/10/2019   Chronic diastolic heart failure ([place]) 06/24/2020  Chronic heart failure with preserved ejection fraction (HCC) 06/24/2020   Chronic kidney disease, stage 2 (mild) 07/22/2014   Chronic kidney disease, stage 3a ([place]) 07/29/2020   Degeneration, intervertebral disc, cervical 07/22/2014   Depression, major, single episode, moderate (HCC) 07/22/2014   Drug rash 07/22/2014   Drug therapy 12/11/2015   Dyspnea on exertion 09/10/2019   Essential hypertension 09/10/2019   Fall at home 07/17/2018   Fatigue 07/22/2014   Former smoker 10/20/2017   Gout 07/22/2014   Gross hematuria 10/08/2014   History of cardioversion 07/01/2020   Hypercholesterolemia 07/22/2014   Hypertensive heart and  kidney disease with heart failure ([place]) 07/22/2014   Hypertonicity of bladder 07/22/2014   Hypothyroidism    Hypothyroidism (acquired) 07/22/2014   Qualifier: Diagnosis of  By: Loanne Drilling MD, Jacelyn Pi  Formatting of this note might be different from the original. Overview:  Qualifier: Diagnosis of  By: Loanne Drilling MD, Kelli Hope, POST-RADIATION 06/11/2009   Qualifier: Diagnosis of  By: Loanne Drilling MD, Hilliard Clark A    Idiopathic chronic gout of multiple sites without tophus 12/11/2015   Idiopathic peripheral neuropathy 12/11/2015   Increased frequency of urination 07/22/2014   Left atrial enlargement 07/22/2014   mild  Formatting of this note might be different from the original. mild   Lethargy 07/22/2014   Localized swelling of both lower legs 06/20/2020   Low back pain 07/22/2014   Mitral regurgitation 09/10/2019   Moderate tricuspid regurgitation 06/18/2020   Muscular deconditioning 07/29/2020   Nonrheumatic tricuspid valve regurgitation 02/20/2021   Obesity 07/22/2014   Onychomycosis due to dermatophyte 07/22/2014   Osteoarthritis, generalized 07/22/2014   Osteopenia 07/22/2014   Osteoporosis 08/24/2018   DEXA 08/23/2018  Formatting of this note might be different from the original. DEXA 08/23/2018   PAF (paroxysmal atrial fibrillation) (HCC)    Palpitations 09/10/2019   PMB (postmenopausal bleeding) 02/20/2021   Primary hypertension 07/18/2020   Pulmonary hypertension ([place]) 12/08/2017   PVC (premature ventricular contraction) 04/17/2018   RBBB 10/20/2017   Recurrent UTI 12/11/2015   S/P total knee arthroplasty 07/29/2020   Sciatica 07/22/2014   Spinal stenosis of lumbar region 12/11/2015   Stenosis of cervix 02/20/2021   TMJ arthralgia 07/22/2014   Unspecified abnormalities of gait and mobility 07/22/2014   Urge incontinence of urine 07/22/2014   Wears 8 extra absorbent pads during day an on overnight pad at night  Formatting of this note might be different from the original. Wears 8 extra absorbent pads during  day an on overnight pad at night   URINARY INCONTINENCE 06/11/2009   Qualifier: Diagnosis of  By: Loanne Drilling MD, Sean A    Urinary tract infection, site not specified 04/04/2014   Varicose veins 07/22/2014   Venous insufficiency 07/22/2014   Venous stasis dermatitis of both lower extremities 07/22/2014   Vitamin D deficiency 07/22/2014    Past Surgical History:  Procedure Laterality Date   BACK SURGERY     CARDIOVERSION N/A 07/01/2020   Procedure: CARDIOVERSION;  Surgeon: Lelon Perla, MD;  Location: [facility] ENDOSCOPY;  Service: Cardiovascular;  Laterality: N/A;   JOINT REPLACEMENT     hip and knee   TEE WITHOUT CARDIOVERSION N/A 07/01/2020   Procedure: TRANSESOPHAGEAL ECHOCARDIOGRAM (TEE);  Surgeon: Lelon Perla, MD;  Location: [facility] ENDOSCOPY;  Service: Cardiovascular;  Laterality: N/A;    Current Medications: Current Meds  Medication Sig   acetaminophen (TYLENOL) 500 MG tablet Take 1,000 mg by mouth every 6 (six) hours as needed for mild pain.  atorvastatin (LIPITOR) 10 MG tablet Take 10 mg by mouth daily.   cholecalciferol (VITAMIN D) 1000 UNITS tablet Take 1,000 Units by mouth daily.   ELIQUIS 5 MG TABS tablet Take 1 tablet (5 mg total) by mouth 2 (two) times daily.   febuxostat (ULORIC) 40 MG tablet Take 40 mg by mouth daily.   hydrocortisone cream 1 % Apply 1 application topically 2 (two) times daily. To back until rash resolved   levothyroxine (SYNTHROID) 100 MCG tablet Take 1 tablet (100 mcg total) by mouth daily.   metolazone (ZAROXOLYN) 5 MG tablet Take 1 tablet (5 mg total) by mouth every 30 (thirty) days. Take 30 minutes for for first Torsemide dose.   torsemide (DEMADEX) 20 MG tablet Take 1 tablet (20 mg total) by mouth daily.   vitamin B-12 (CYANOCOBALAMIN) 1000 MCG tablet Take 1,000 mcg by mouth daily.   [DISCONTINUED] furosemide (LASIX) 40 MG tablet Take 1 tablet (40 mg total) by mouth daily.     Allergies:   Isosorbide nitrate, Cephalexin, Gentamicin, Penicillins, and  Sulfa antibiotics   Social History   Socioeconomic History   Marital status: Widowed    Spouse name: Not on file   Number of children: Not on file   Years of education: Not on file   Highest education level: Not on file  Occupational History   Not on file  Tobacco Use   Smoking status: Never   Smokeless tobacco: Never  Substance and Sexual Activity   Alcohol use: No   Drug use: No   Sexual activity: Not on file  Other Topics Concern   Not on file  Social History Narrative   Not on file   Social Determinants of Health   Financial Resource Strain: Not on file  Food Insecurity: Not on file  Transportation Needs: Not on file  Physical Activity: Not on file  Stress: Not on file  Social Connections: Not on file     Family History: The patient's family history includes Cancer in her mother; Emphysema in her brother; Heart attack in her brother; Heart disease in her brother; Hypertension in her mother; Prostate cancer in her father.  ROS:   Review of Systems  Constitution: Negative for decreased appetite, fever and weight gain.  HENT: Negative for congestion, ear discharge, hoarse voice and sore throat.   Eyes: Negative for discharge, redness, vision loss in right eye and visual halos.  Cardiovascular: Reports leg swelling.  Negative for chest pain, dyspnea on exertion, and palpitations.  Respiratory: Negative for cough, hemoptysis, shortness of breath and snoring.   Endocrine: Negative for heat intolerance and polyphagia.  Hematologic/Lymphatic: Negative for bleeding problem. Does not bruise/bleed easily.  Skin: Negative for flushing, nail changes, rash and suspicious lesions.  Musculoskeletal: Negative for arthritis, joint pain, muscle cramps, myalgias, neck pain and stiffness.  Gastrointestinal: Negative for abdominal pain, bowel incontinence, diarrhea and excessive appetite.  Genitourinary: Negative for decreased libido, genital sores and incomplete emptying.   Neurological: Negative for brief paralysis, focal weakness, headaches and loss of balance.  Psychiatric/Behavioral: Negative for altered mental status, depression and suicidal ideas.  Allergic/Immunologic: Negative for HIV exposure and persistent infections.    EKGs/Labs/Other Studies Reviewed:    The following studies were reviewed today:   EKG:  None today  Recent Labs: 12/29/2020: TSH 8.160 02/20/2021: Hemoglobin 11.1; NT-Pro BNP 2,536; Platelets 232 06/09/2021: BUN 68; Creatinine, Ser 1.33; Magnesium 2.1; Potassium 4.4; Sodium 138  Recent Lipid Panel    Component Value Date/Time   CHOL  180 01/03/2020 0911   TRIG 57 01/03/2020 0911   HDL 74 01/03/2020 0911   CHOLHDL 2.4 01/03/2020 0911   LDLCALC 95 01/03/2020 0911    Physical Exam:    VS:  BP 120/66 (BP Location: Left Arm)    Pulse 94    Ht 5\' 10"  (1.778 m)    Wt 158 lb (71.7 kg)    SpO2 98%    BMI 22.67 kg/m     Wt Readings from Last 3 Encounters:  09/17/21 158 lb (71.7 kg)  06/09/21 153 lb 12.8 oz (69.8 kg)  02/20/21 163 lb 6.4 oz (74.1 kg)     GEN: Well nourished, well developed in no acute distress HEENT: Normal NECK: No JVD; No carotid bruits LYMPHATICS: No lymphadenopathy CARDIAC: S1S2 noted,RRR, no murmurs, rubs, gallops RESPIRATORY:  Clear to auscultation without rales, wheezing or rhonchi  ABDOMEN: Soft, non-tender, non-distended, +bowel sounds, no guarding. EXTREMITIES: +2 bilateral edema, No cyanosis, no clubbing MUSCULOSKELETAL:  No deformity  SKIN: Warm and dry NEUROLOGIC:  Alert and oriented x 3, non-focal PSYCHIATRIC:  Normal affect, good insight  ASSESSMENT:    1. PAF (paroxysmal atrial fibrillation) (HCC)   2. Chronic diastolic heart failure (HCC)   3. Medication management   4. Hypertensive heart and kidney disease with heart failure (HCC)    PLAN:    Significant leg swelling but this is not new which is slightly more than prior.  What I like to do is now transition the patient from Lasix  to torsemide.  She will start torsemide 20 mg daily.  I will also give the Zaroxolyn 5 mg once a month to help.  I have advised the patient on how to use the Zaroxolyn-30 minutes before her torsemide dosing of the day she takes the medication.  Continue her chronic anticoagulation for paroxysmal atrial fibrillation.  Blood pressure is acceptable, continue with current antihypertensive regimen.  Blood work will be done today to assess kidney function, BNP as well as she is requesting a thyroid test today.  The patient is in agreement with the above plan. The patient left the office in stable condition.  The patient will follow up in 12 weeks or sooner if needed.   Medication Adjustments/Labs and Tests Ordered: Current medicines are reviewed at length with the patient today.  Concerns regarding medicines are outlined above.  Orders Placed This Encounter  Procedures   Basic Metabolic Panel (BMET)   Magnesium   TSH+T4F+T3Free   Pro b natriuretic peptide (BNP)   Meds ordered this encounter  Medications   metolazone (ZAROXOLYN) 5 MG tablet    Sig: Take 1 tablet (5 mg total) by mouth every 30 (thirty) days. Take 30 minutes for for first Torsemide dose.    Dispense:  5 tablet    Refill:  0   torsemide (DEMADEX) 20 MG tablet    Sig: Take 1 tablet (20 mg total) by mouth daily.    Dispense:  90 tablet    Refill:  3    Patient Instructions  Medication Instructions:  Your physician has recommended you make the following change in your medication:  STOP: Lasix START: Torsemide 20 mg once daily START: Metolazone 5 mg once monthly (30 minutes before first Torsemide dose)  *If you need a refill on your cardiac medications before your next appointment, please call your pharmacy*   Lab Work: Your physician recommends that you return for lab work in:  TODAY: BMET, Mag, Tsh+T3+T4, BNP If you have labs (blood work)  drawn today and your tests are completely normal, you will receive your results  only by: MyChart Message (if you have MyChart) OR A paper copy in the mail If you have any lab test that is abnormal or we need to change your treatment, we will call you to review the results.   Testing/Procedures: None   Follow-Up: At [facility], you and your health needs are our priority.  As part of our continuing mission to provide you with exceptional heart care, we have created designated Provider Care Teams.  These Care Teams include your primary Cardiologist (physician) and Advanced Practice Providers (APPs -  Physician Assistants and Nurse Practitioners) who all work together to provide you with the care you need, when you need it.  We recommend signing up for the patient portal called "MyChart".  Sign up information is provided on this After Visit Summary.  MyChart is used to connect with patients for Virtual Visits (Telemedicine).  Patients are able to view lab/test results, encounter notes, upcoming appointments, etc.  Non-urgent messages can be sent to your provider as well.   To learn more about what you can do with MyChart, go to [email or web address].    Your next appointment:   12 week(s)  The format for your next appointment:   In Person  Provider:   Thomasene RippleKardie Karston Hyland, DO     Other Instructions     Adopting a Healthy Lifestyle.  Know what a healthy weight is for you (roughly BMI <25) and aim to maintain this   Aim for 7+ servings of fruits and vegetables daily   65-80+ fluid ounces of water or unsweet tea for healthy kidneys   Limit to max 1 drink of alcohol per day; avoid smoking/tobacco   Limit animal fats in diet for cholesterol and heart health - choose grass fed whenever available   Avoid highly processed foods, and foods high in saturated/trans fats   Aim for low stress - take time to unwind and care for your mental health   Aim for 150 min of moderate intensity exercise weekly for heart health, and weights twice weekly for bone health   Aim for  7-9 hours of sleep daily   When it comes to diets, agreement about the perfect plan isnt easy to find, even among the experts. Experts at the [employer] developed an idea known as the Healthy Eating Plate. Just imagine a plate divided into logical, healthy portions.   The emphasis is on diet quality:   Load up on vegetables and fruits - one-half of your plate: Aim for color and variety, and remember that potatoes dont count.   Go for whole grains - one-quarter of your plate: Whole wheat, barley, wheat berries, quinoa, oats, brown rice, and foods made with them. If you want pasta, go with whole wheat pasta.   Protein power - one-quarter of your plate: Fish, chicken, beans, and nuts are all healthy, versatile protein sources. Limit red meat.   The diet, however, does go beyond the plate, offering a few other suggestions.   Use healthy plant oils, such as olive, canola, soy, corn, sunflower and peanut. Check the labels, and avoid partially hydrogenated oil, which have unhealthy trans fats.   If youre thirsty, drink water. Coffee and tea are good in moderation, but skip sugary drinks and limit milk and dairy products to one or two daily servings.   The type of carbohydrate in the diet is more important than the amount. Some  sources of carbohydrates, such as vegetables, fruits, whole grains, and beans-are healthier than others.   Finally, stay active  Signed, Berniece Salines, DO  09/17/2021 5:00 PM    [place]

## 2021-09-17 NOTE — Patient Instructions (Signed)
Medication Instructions:  Your physician has recommended you make the following change in your medication:  STOP: Lasix START: Torsemide 20 mg once daily START: Metolazone 5 mg once monthly (30 minutes before first Torsemide dose)  *If you need a refill on your cardiac medications before your next appointment, please call your pharmacy*   Lab Work: Your physician recommends that you return for lab work in:  TODAY: BMET, Mag, Tsh+T3+T4, BNP If you have labs (blood work) drawn today and your tests are completely normal, you will receive your results only by: MyChart Message (if you have MyChart) OR A paper copy in the mail If you have any lab test that is abnormal or we need to change your treatment, we will call you to review the results.   Testing/Procedures: None   Follow-Up: At [facility], you and your health needs are our priority.  As part of our continuing mission to provide you with exceptional heart care, we have created designated Provider Care Teams.  These Care Teams include your primary Cardiologist (physician) and Advanced Practice Providers (APPs -  Physician Assistants and Nurse Practitioners) who all work together to provide you with the care you need, when you need it.  We recommend signing up for the patient portal called "MyChart".  Sign up information is provided on this After Visit Summary.  MyChart is used to connect with patients for Virtual Visits (Telemedicine).  Patients are able to view lab/test results, encounter notes, upcoming appointments, etc.  Non-urgent messages can be sent to your provider as well.   To learn more about what you can do with MyChart, go to [email or web address].    Your next appointment:   12 week(s)  The format for your next appointment:   In Person  Provider:   Thomasene Ripple, DO     Other Instructions

## 2021-09-18 LAB — TSH+T4F+T3FREE
Free T4: 1.53 ng/dL (ref 0.82–1.77)
T3, Free: 1.2 pg/mL — ABNORMAL LOW (ref 2.0–4.4)
TSH: 5.85 u[IU]/mL — ABNORMAL HIGH (ref 0.450–4.500)

## 2021-09-18 LAB — BASIC METABOLIC PANEL
BUN/Creatinine Ratio: 28 (ref 12–28)
BUN: 49 mg/dL — ABNORMAL HIGH (ref 8–27)
CO2: 26 mmol/L (ref 20–29)
Calcium: 8.2 mg/dL — ABNORMAL LOW (ref 8.7–10.3)
Chloride: 97 mmol/L (ref 96–106)
Creatinine, Ser: 1.77 mg/dL — ABNORMAL HIGH (ref 0.57–1.00)
Glucose: 84 mg/dL (ref 70–99)
Potassium: 4 mmol/L (ref 3.5–5.2)
Sodium: 138 mmol/L (ref 134–144)
eGFR: 27 mL/min/{1.73_m2} — ABNORMAL LOW (ref >59)

## 2021-09-18 LAB — PRO B NATRIURETIC PEPTIDE: NT-Pro BNP: 10422 pg/mL — ABNORMAL HIGH (ref 0–738)

## 2021-09-18 LAB — MAGNESIUM: Magnesium: 2.1 mg/dL (ref 1.6–2.3)

## 2021-09-29 ENCOUNTER — Other Ambulatory Visit: Payer: Self-pay | Admitting: Cardiology

## 2021-09-29 DIAGNOSIS — I4819 Other persistent atrial fibrillation: Secondary | ICD-10-CM

## 2021-10-15 ENCOUNTER — Encounter: Payer: Self-pay | Admitting: Cardiology

## 2021-10-15 ENCOUNTER — Ambulatory Visit: Payer: Medicare PPO | Admitting: Cardiology

## 2021-10-15 ENCOUNTER — Other Ambulatory Visit: Payer: Self-pay

## 2021-10-15 ENCOUNTER — Telehealth: Payer: Self-pay | Admitting: Cardiology

## 2021-10-15 VITALS — BP 110/70 | HR 67 | Ht 70.0 in | Wt 154.4 lb

## 2021-10-15 DIAGNOSIS — I480 Paroxysmal atrial fibrillation: Secondary | ICD-10-CM | POA: Diagnosis not present

## 2021-10-15 DIAGNOSIS — Z79899 Other long term (current) drug therapy: Secondary | ICD-10-CM | POA: Diagnosis not present

## 2021-10-15 DIAGNOSIS — I130 Hypertensive heart and chronic kidney disease with heart failure and stage 1 through stage 4 chronic kidney disease, or unspecified chronic kidney disease: Secondary | ICD-10-CM

## 2021-10-15 DIAGNOSIS — I493 Ventricular premature depolarization: Secondary | ICD-10-CM

## 2021-10-15 DIAGNOSIS — I10 Essential (primary) hypertension: Secondary | ICD-10-CM

## 2021-10-15 DIAGNOSIS — I5032 Chronic diastolic (congestive) heart failure: Secondary | ICD-10-CM | POA: Diagnosis not present

## 2021-10-15 MED ORDER — TORSEMIDE 20 MG PO TABS: ORAL_TABLET | ORAL | 3 refills | Status: DC

## 2021-10-15 NOTE — Progress Notes (Signed)
Cardiology Office Note:    Date:  10/15/2021   ID:  [name], DOB [birth year], MRN [id number]  PCP:  Nicoletta Dress, MD  Cardiologist:  Berniece Salines, DO  Electrophysiologist:  None   Referring MD: Nicoletta Dress, MD   " I am   History of Present Illness:    [name] is a 88 y.o. female with a hx of  EF [id number], grade 2 diastolic dysfunction, persistent atrial fibrillation on EliquisIs here today for follow-up visit.  Did see the patient in December 2021 at that time we increased her Lasix to 40 mg in the a.m. and 20 mg in the evening for 3 days.   I saw the patient on December 29, 2020 at that time I increased her Lasix to 40 mg twice a day with potassium supplement.  I reviewed her echocardiogram which was done at [facility] showing EF 60 to 65% with moderate TR as well as moderate regurgitation.  We continued her Eliquis and her Cardizem for A. fib.   I saw the patient in March 09, 2021 at that time no medication changes were made.  We talked about her pulmonary hypertension and what it means.  She had no questions at that time.   I last saw the patient in June 09, 2021 at that time she appeared to be doing well from a cardiovascular standpoint.  We continue her medication with no changes.  Saw the patient on September 17, 2021 at that time she was experiencing significant leg swelling.  She also noted that she was at [facility] where she was treated for sepsis.  At the conclusion of that visit I changed the patient to torsemide 20 mg daily also gave her Zaroxolyn once a month to help with her fluid overload.  She is here today for follow-up visit.  She tells me that she has been doing well since I saw her.  She is responding well to the torsemide and her once a month Zaroxolyn.    Past Medical History:  Diagnosis Date   Abnormal nuclear stress test 11/15/2019   Abnormal perfusion scintigraphy 07/22/2014   Lexiscan MPS Jan 2012 with mild mis and apical anterior  ischemia EF 59% she was seen at [place] Jan 2012 with mild mis and apical anterior ischemia EF 59% she was seen at [facility] of this note might be different from the original. Overview:  Lexiscan MPS Jan 2012 with mild mis and apical anterior ischemia EF 59% she was seen at [facility] of this note might be different from   Abnormal results of function studies of other organs and systems 07/22/2014   Lexiscan MPS Jan 2012 with mild mis and apical anterior ischemia EF 59% she was seen at [facility] of this note might be different from the original. Overview:  Lexiscan MPS Jan 2012 with mild mis and apical anterior ischemia EF 59% she was seen at [facility]   Acute cystitis with hematuria 11/15/2014   Anxiety disorder 07/22/2014   Atrial fibrillation with rapid ventricular response ([place]) 06/18/2020   Atrial fibrillation, currently in sinus rhythm 07/29/2020   B12 deficiency 07/22/2014   Benign hypertensive heart and kidney disease with chronic kidney disease 07/22/2014   Bilateral leg edema 02/20/2021   CAD (coronary artery disease) 01/02/2020   Cat bite 06/20/2020   Chest tightness 09/10/2019   Chronic diastolic heart failure ([place]) 06/24/2020   Chronic heart failure with preserved ejection fraction ([place])  06/24/2020   Chronic kidney disease, stage 2 (mild) 07/22/2014   Chronic kidney disease, stage 3a ([place]) 07/29/2020   Degeneration, intervertebral disc, cervical 07/22/2014   Depression, major, single episode, moderate (HCC) 07/22/2014   Drug rash 07/22/2014   Drug therapy 12/11/2015   Dyspnea on exertion 09/10/2019   Essential hypertension 09/10/2019   Fall at home 07/17/2018   Fatigue 07/22/2014   Former smoker 10/20/2017   Gout 07/22/2014   Gross hematuria 10/08/2014   History of cardioversion 07/01/2020   Hypercholesterolemia 07/22/2014   Hypertensive heart and kidney disease with heart failure ([place]) 07/22/2014   Hypertonicity of bladder 07/22/2014   Hypothyroidism    Hypothyroidism  (acquired) 07/22/2014   Qualifier: Diagnosis of  By: Loanne Drilling MD, Jacelyn Pi  Formatting of this note might be different from the original. Overview:  Qualifier: Diagnosis of  By: Loanne Drilling MD, Kelli Hope, POST-RADIATION 06/11/2009   Qualifier: Diagnosis of  By: Loanne Drilling MD, Hilliard Clark A    Idiopathic chronic gout of multiple sites without tophus 12/11/2015   Idiopathic peripheral neuropathy 12/11/2015   Increased frequency of urination 07/22/2014   Left atrial enlargement 07/22/2014   mild  Formatting of this note might be different from the original. mild   Lethargy 07/22/2014   Localized swelling of both lower legs 06/20/2020   Low back pain 07/22/2014   Mitral regurgitation 09/10/2019   Moderate tricuspid regurgitation 06/18/2020   Muscular deconditioning 07/29/2020   Nonrheumatic tricuspid valve regurgitation 02/20/2021   Obesity 07/22/2014   Onychomycosis due to dermatophyte 07/22/2014   Osteoarthritis, generalized 07/22/2014   Osteopenia 07/22/2014   Osteoporosis 08/24/2018   DEXA 08/23/2018  Formatting of this note might be different from the original. DEXA 08/23/2018   PAF (paroxysmal atrial fibrillation) (HCC)    Palpitations 09/10/2019   PMB (postmenopausal bleeding) 02/20/2021   Primary hypertension 07/18/2020   Pulmonary hypertension ([place]) 12/08/2017   PVC (premature ventricular contraction) 04/17/2018   RBBB 10/20/2017   Recurrent UTI 12/11/2015   S/P total knee arthroplasty 07/29/2020   Sciatica 07/22/2014   Spinal stenosis of lumbar region 12/11/2015   Stenosis of cervix 02/20/2021   TMJ arthralgia 07/22/2014   Unspecified abnormalities of gait and mobility 07/22/2014   Urge incontinence of urine 07/22/2014   Wears 8 extra absorbent pads during day an on overnight pad at night  Formatting of this note might be different from the original. Wears 8 extra absorbent pads during day an on overnight pad at night   URINARY INCONTINENCE 06/11/2009   Qualifier: Diagnosis of  By: Loanne Drilling MD, Sean A     Urinary tract infection, site not specified 04/04/2014   Varicose veins 07/22/2014   Venous insufficiency 07/22/2014   Venous stasis dermatitis of both lower extremities 07/22/2014   Vitamin D deficiency 07/22/2014    Past Surgical History:  Procedure Laterality Date   BACK SURGERY     CARDIOVERSION N/A 07/01/2020   Procedure: CARDIOVERSION;  Surgeon: Lelon Perla, MD;  Location: [facility] ENDOSCOPY;  Service: Cardiovascular;  Laterality: N/A;   JOINT REPLACEMENT     hip and knee   TEE WITHOUT CARDIOVERSION N/A 07/01/2020   Procedure: TRANSESOPHAGEAL ECHOCARDIOGRAM (TEE);  Surgeon: Lelon Perla, MD;  Location: [facility] ENDOSCOPY;  Service: Cardiovascular;  Laterality: N/A;    Current Medications: Current Meds  Medication Sig   acetaminophen (TYLENOL) 500 MG tablet Take 1,000 mg by mouth every 6 (six) hours as needed for mild pain.   atorvastatin (LIPITOR) 10 MG tablet TAKE 1  TABLET BY MOUTH DAILY.   cholecalciferol (VITAMIN D) 1000 UNITS tablet Take 1,000 Units by mouth daily.   ELIQUIS 5 MG TABS tablet TAKE 1 TABLET BY MOUTH 2 TIMES DAILY.   febuxostat (ULORIC) 40 MG tablet Take 40 mg by mouth daily.   hydrocortisone cream 1 % Apply 1 application topically 2 (two) times daily. To back until rash resolved   levothyroxine (SYNTHROID) 100 MCG tablet Take 1 tablet (100 mcg total) by mouth daily.   metolazone (ZAROXOLYN) 5 MG tablet Take 1 tablet (5 mg total) by mouth every 30 (thirty) days. Take 30 minutes for for first Torsemide dose.   vitamin B-12 (CYANOCOBALAMIN) 1000 MCG tablet Take 1,000 mcg by mouth daily.   [DISCONTINUED] torsemide (DEMADEX) 20 MG tablet Take 1 tablet (20 mg total) by mouth daily.     Allergies:   Isosorbide nitrate, Cephalexin, Gentamicin, Penicillins, and Sulfa antibiotics   Social History   Socioeconomic History   Marital status: Widowed    Spouse name: Not on file   Number of children: Not on file   Years of education: Not on file   Highest education level:  Not on file  Occupational History   Not on file  Tobacco Use   Smoking status: Never   Smokeless tobacco: Never  Substance and Sexual Activity   Alcohol use: No   Drug use: No   Sexual activity: Not on file  Other Topics Concern   Not on file  Social History Narrative   Not on file   Social Determinants of Health   Financial Resource Strain: Not on file  Food Insecurity: Not on file  Transportation Needs: Not on file  Physical Activity: Not on file  Stress: Not on file  Social Connections: Not on file     Family History: The patient's family history includes Cancer in her mother; Emphysema in her brother; Heart attack in her brother; Heart disease in her brother; Hypertension in her mother; Prostate cancer in her father.  ROS:   Review of Systems  Constitution: Negative for decreased appetite, fever and weight gain.  HENT: Negative for congestion, ear discharge, hoarse voice and sore throat.   Eyes: Negative for discharge, redness, vision loss in right eye and visual halos.  Cardiovascular: Negative for chest pain, dyspnea on exertion, leg swelling, orthopnea and palpitations.  Respiratory: Negative for cough, hemoptysis, shortness of breath and snoring.   Endocrine: Negative for heat intolerance and polyphagia.  Hematologic/Lymphatic: Negative for bleeding problem. Does not bruise/bleed easily.  Skin: Negative for flushing, nail changes, rash and suspicious lesions.  Musculoskeletal: Negative for arthritis, joint pain, muscle cramps, myalgias, neck pain and stiffness.  Gastrointestinal: Negative for abdominal pain, bowel incontinence, diarrhea and excessive appetite.  Genitourinary: Negative for decreased libido, genital sores and incomplete emptying.  Neurological: Negative for brief paralysis, focal weakness, headaches and loss of balance.  Psychiatric/Behavioral: Negative for altered mental status, depression and suicidal ideas.  Allergic/Immunologic: Negative for HIV  exposure and persistent infections.    EKGs/Labs/Other Studies Reviewed:    The following studies were reviewed today:   EKG:  None today  Previous imaging reviewed.  Recent Labs: 02/20/2021: Hemoglobin 11.1; Platelets 232 09/17/2021: BUN 49; Creatinine, Ser 1.77; Magnesium 2.1; NT-Pro BNP 10,422; Potassium 4.0; Sodium 138; TSH 5.850  Recent Lipid Panel    Component Value Date/Time   CHOL 180 01/03/2020 0911   TRIG 57 01/03/2020 0911   HDL 74 01/03/2020 0911   CHOLHDL 2.4 01/03/2020 0911   LDLCALC 95  01/03/2020 0911    Physical Exam:    VS:  BP 110/70 (BP Location: Left Arm, Patient Position: Sitting)    Pulse 67    Ht 5\' 10"  (1.778 m)    Wt 154 lb 6.4 oz (70 kg)    SpO2 98%    BMI 22.15 kg/m     Wt Readings from Last 3 Encounters:  10/15/21 154 lb 6.4 oz (70 kg)  09/17/21 158 lb (71.7 kg)  06/09/21 153 lb 12.8 oz (69.8 kg)     GEN: Well nourished, well developed in no acute distress HEENT: Normal NECK: No JVD; No carotid bruits LYMPHATICS: No lymphadenopathy CARDIAC: S1S2 noted,RRR, no murmurs, rubs, gallops RESPIRATORY:  Clear to auscultation without rales, wheezing or rhonchi  ABDOMEN: Soft, non-tender, non-distended, +bowel sounds, no guarding. EXTREMITIES: +1 bilateral leg edema, No cyanosis, no clubbing MUSCULOSKELETAL:  No deformity  SKIN: Warm and dry NEUROLOGIC:  Alert and oriented x 3, non-focal PSYCHIATRIC:  Normal affect, good insight  ASSESSMENT:    1. Chronic diastolic heart failure ([place])   2. Essential hypertension   3. Hypertensive heart and kidney disease with heart failure ([place])   4. PVC (premature ventricular contraction)   5. PAF (paroxysmal atrial fibrillation) (HCC)    PLAN:    She has improved clinically but she still does have significant leg edema.  We will transition adjust her diuretics today.  She has tolerated torsemide.  We will do 40 mg of torsemide Monday Wednesday and Fridays and 20 mg on other days.  I will have her take an  extra metolazone [place].  But then she will go back to once a month as has been prescribed.  Blood work will be done today for BMP, mag as well as BNP.  Continue her chronic anticoagulation for paroxysmal atrial fibrillation.   Blood pressure is acceptable, continue with current antihypertensive regimen.    The patient is in agreement with the above plan. The patient left the office in stable condition.  The patient will follow up in 6 months.   Medication Adjustments/Labs and Tests Ordered: Current medicines are reviewed at length with the patient today.  Concerns regarding medicines are outlined above.  No orders of the defined types were placed in this encounter.  No orders of the defined types were placed in this encounter.   Patient Instructions  Medication Instructions:  Your physician has recommended you make the following change in your medication:  START: Torsemide 40 mg Monday, Wednesday and Friday; take 20 mg the other day.  *If you need a refill on your cardiac medications before your next appointment, please call your pharmacy*   Lab Work: Your physician recommends that you return for lab work in:  TODAY: BMET, Mag, BNP If you have labs (blood work) drawn today and your tests are completely normal, you will receive your results only by: MyChart Message (if you have [place]) OR A paper copy in the mail If you have any lab test that is abnormal or we need to change your treatment, we will call you to review the results.   Testing/Procedures: None   Follow-Up: At [facility], you and your health needs are our priority.  As part of our continuing mission to provide you with exceptional heart care, we have created designated Provider Care Teams.  These Care Teams include your primary Cardiologist (physician) and Advanced Practice Providers (APPs -  Physician Assistants and Nurse Practitioners) who all work together to provide you with the care you  need, when you  need it.  We recommend signing up for the patient portal called "MyChart".  Sign up information is provided on this After Visit Summary.  MyChart is used to connect with patients for Virtual Visits (Telemedicine).  Patients are able to view lab/test results, encounter notes, upcoming appointments, etc.  Non-urgent messages can be sent to your provider as well.   To learn more about what you can do with MyChart, go to [email or web address].    Your next appointment:   6 month(s)  The format for your next appointment:   In Person  Provider:   Berniece Salines, DO     Other Instructions     Adopting a Healthy Lifestyle.  Know what a healthy weight is for you (roughly BMI <25) and aim to maintain this   Aim for 7+ servings of fruits and vegetables daily   65-80+ fluid ounces of water or unsweet tea for healthy kidneys   Limit to max 1 drink of alcohol per day; avoid smoking/tobacco   Limit animal fats in diet for cholesterol and heart health - choose grass fed whenever available   Avoid highly processed foods, and foods high in saturated/trans fats   Aim for low stress - take time to unwind and care for your mental health   Aim for 150 min of moderate intensity exercise weekly for heart health, and weights twice weekly for bone health   Aim for 7-9 hours of sleep daily   When it comes to diets, agreement about the perfect plan isnt easy to find, even among the experts. Experts at the [employer] developed an idea known as the Healthy Eating Plate. Just imagine a plate divided into logical, healthy portions.   The emphasis is on diet quality:   Load up on vegetables and fruits - one-half of your plate: Aim for color and variety, and remember that potatoes dont count.   Go for whole grains - one-quarter of your plate: Whole wheat, barley, wheat berries, quinoa, oats, brown rice, and foods made with them. If you want pasta, go with whole wheat pasta.    Protein power - one-quarter of your plate: Fish, chicken, beans, and nuts are all healthy, versatile protein sources. Limit red meat.   The diet, however, does go beyond the plate, offering a few other suggestions.   Use healthy plant oils, such as olive, canola, soy, corn, sunflower and peanut. Check the labels, and avoid partially hydrogenated oil, which have unhealthy trans fats.   If youre thirsty, drink water. Coffee and tea are good in moderation, but skip sugary drinks and limit milk and dairy products to one or two daily servings.   The type of carbohydrate in the diet is more important than the amount. Some sources of carbohydrates, such as vegetables, fruits, whole grains, and beans-are healthier than others.   Finally, stay active  Signed, Berniece Salines, DO  10/15/2021 2:23 PM    [facility]

## 2021-10-15 NOTE — Patient Instructions (Signed)
Medication Instructions:  Your physician has recommended you make the following change in your medication:  START: Torsemide 40 mg Monday, Wednesday and Friday; take 20 mg the other day.  *If you need a refill on your cardiac medications before your next appointment, please call your pharmacy*   Lab Work: Your physician recommends that you return for lab work in:  TODAY: BMET, Mag, BNP If you have labs (blood work) drawn today and your tests are completely normal, you will receive your results only by: MyChart Message (if you have MyChart) OR A paper copy in the mail If you have any lab test that is abnormal or we need to change your treatment, we will call you to review the results.   Testing/Procedures: None   Follow-Up: At [facility], you and your health needs are our priority.  As part of our continuing mission to provide you with exceptional heart care, we have created designated Provider Care Teams.  These Care Teams include your primary Cardiologist (physician) and Advanced Practice Providers (APPs -  Physician Assistants and Nurse Practitioners) who all work together to provide you with the care you need, when you need it.  We recommend signing up for the patient portal called "MyChart".  Sign up information is provided on this After Visit Summary.  MyChart is used to connect with patients for Virtual Visits (Telemedicine).  Patients are able to view lab/test results, encounter notes, upcoming appointments, etc.  Non-urgent messages can be sent to your provider as well.   To learn more about what you can do with MyChart, go to [email or web address].    Your next appointment:   6 month(s)  The format for your next appointment:   In Person  Provider:   Thomasene Ripple, DO     Other Instructions

## 2021-10-15 NOTE — Telephone Encounter (Signed)
Patient's son is requesting a call back to review patient's office visit with Dr. Servando Salina today, 1/26. He states he did not have an opportunity to be there and he would like to know what was discussed.

## 2021-10-15 NOTE — Telephone Encounter (Signed)
Spoke to patient's son Altamese Dilling he stated he was not able to come to his mother's appointment today.Stated he would like to speak to Dr.Tobb's RN to discuss today's visit.Advised I will send message to her.

## 2021-10-16 LAB — BASIC METABOLIC PANEL
BUN/Creatinine Ratio: 28 (ref 12–28)
BUN: 44 mg/dL — ABNORMAL HIGH (ref 8–27)
CO2: 27 mmol/L (ref 20–29)
Calcium: 8.9 mg/dL (ref 8.7–10.3)
Chloride: 99 mmol/L (ref 96–106)
Creatinine, Ser: 1.59 mg/dL — ABNORMAL HIGH (ref 0.57–1.00)
Glucose: 89 mg/dL (ref 70–99)
Potassium: 5.1 mmol/L (ref 3.5–5.2)
Sodium: 139 mmol/L (ref 134–144)
eGFR: 31 mL/min/{1.73_m2} — ABNORMAL LOW (ref >59)

## 2021-10-16 LAB — MAGNESIUM: Magnesium: 2.1 mg/dL (ref 1.6–2.3)

## 2021-10-16 LAB — PRO B NATRIURETIC PEPTIDE: NT-Pro BNP: 5423 pg/mL — ABNORMAL HIGH (ref 0–738)

## 2021-10-20 ENCOUNTER — Telehealth: Payer: Self-pay | Admitting: Cardiology

## 2021-10-20 DIAGNOSIS — I4819 Other persistent atrial fibrillation: Secondary | ICD-10-CM

## 2021-10-20 MED ORDER — APIXABAN 2.5 MG PO TABS
2.5000 mg | ORAL_TABLET | Freq: Two times a day (BID) | ORAL | 3 refills | Status: AC
Start: 2021-10-20 — End: ?

## 2021-10-20 NOTE — Telephone Encounter (Signed)
Called and spoke with [name] and updated her on the pt's medication regimen. Eliquis 2.5 mg twice daily per Dr. Harriet Masson. Pt was taken off Coreg last year. She verbalized understanding, medication list updated. No further questions at this time.

## 2021-10-20 NOTE — Telephone Encounter (Signed)
Spoke with home health staff Judeth Cornfield who wanted clarification on the dose of eliquis (2.5 or  5mg  bid), if the patient is to take carvedilol 3.125mg  bid, and what the fluid intake is recommended per day. Please advise.

## 2021-10-20 NOTE — Telephone Encounter (Signed)
Please change the Eliquis prescription to 2.5 mg daily.  She should be off of the carvedilol.  We can have her fluid restricted 1200 mL.

## 2021-10-20 NOTE — Telephone Encounter (Signed)
° °  Pt c/o medication issue:  1. Name of Medication: ELIQUIS 5 MG TABS tablet carvedilol 3.125 mg   2. How are you currently taking this medication (dosage and times per day)?   3. Are you having a reaction (difficulty breathing--STAT)?   4. What is your medication issue? [facility] called, would like to verify what dosage of eliquis the pt should be taking. She said when she started visiting pt in November, pt was taking eliquis 2.5 mg twice a day, she is not sure if Dr. Harriet Masson increased her dosage. Also, pt was taking carvedilol 3.125 mg twice a day and its not longer on her meds list, she wanted to verify if pt needs to continue taking this or not. Lastly, she would like to ask recommendable fluid intake for pt.

## 2021-10-20 NOTE — Telephone Encounter (Signed)
Spoke with nurse Judeth Cornfield.

## 2021-10-22 DIAGNOSIS — L578 Other skin changes due to chronic exposure to nonionizing radiation: Secondary | ICD-10-CM | POA: Diagnosis not present

## 2021-10-22 DIAGNOSIS — L570 Actinic keratosis: Secondary | ICD-10-CM | POA: Diagnosis not present

## 2021-10-22 DIAGNOSIS — D0439 Carcinoma in situ of skin of other parts of face: Secondary | ICD-10-CM | POA: Diagnosis not present

## 2021-10-22 DIAGNOSIS — L814 Other melanin hyperpigmentation: Secondary | ICD-10-CM | POA: Diagnosis not present

## 2021-10-23 ENCOUNTER — Telehealth: Payer: Self-pay | Admitting: Cardiology

## 2021-10-23 NOTE — Telephone Encounter (Signed)
-  Pt called stating she took torsemide 40 mg on Mon and Wed as instructed by Dr. Servando Salina, then 20 mg Tues and Thurs. - Pt state last night she experienced frequent urination and felt weak earlier today, BP 127/99. -Pt state swelling has significantly improved but concerned she may become dehydrated. -Pt state she only took 20 mg of lasix today -Pt questioning whether she should continue 40 mg Mon Wed Fri   Will forward to MD for recommendations.

## 2021-10-23 NOTE — Telephone Encounter (Signed)
Pt c/o medication issue:  1. Name of Medication:  torsemide (DEMADEX) 20 MG tablet  2. How are you currently taking this medication (dosage and times per day)?  As prescribed   3. Are you having a reaction (difficulty breathing--STAT)?   4. What is your medication issue?   Patient states last night she experienced frequent urination and lost a significant amount of fluid. Swelling has gone down tremendously, but she is afraid to take 40 MG today due to this. She assumes she may lose too much fluid or become dehydrated. She states she has already been feeling weak. She states she only took 20 MG today and would like to confirm that this is alright. Please advise.

## 2021-10-26 ENCOUNTER — Other Ambulatory Visit: Payer: Self-pay | Admitting: Cardiology

## 2021-10-26 MED ORDER — TORSEMIDE 20 MG PO TABS: 20.0000 mg | ORAL_TABLET | Freq: Once | ORAL | 3 refills | Status: AC

## 2021-10-26 NOTE — Telephone Encounter (Signed)
Called pt. She states she has been really weak since last Thursday. Per Dr. Servando Salina pt can go back to Torsemide 20 mg once daily. Pt wants to hold her dose for today because she is so weak. Pt made aware she should keep an eye on the swelling and reach out to [place] if she has any issues. She verbalized understanding. Medication list updated.

## 2021-10-26 NOTE — Telephone Encounter (Signed)
Please have the patient go back to just taking torsemide 20 mg a day.

## 2021-10-26 NOTE — Addendum Note (Signed)
Addended by: Orvan July on: 10/26/2021 09:22 AM   Modules accepted: Orders

## 2021-11-10 DIAGNOSIS — E039 Hypothyroidism, unspecified: Secondary | ICD-10-CM | POA: Diagnosis not present

## 2021-11-10 DIAGNOSIS — M109 Gout, unspecified: Secondary | ICD-10-CM | POA: Diagnosis not present

## 2021-11-10 DIAGNOSIS — E785 Hyperlipidemia, unspecified: Secondary | ICD-10-CM | POA: Diagnosis not present

## 2021-11-10 DIAGNOSIS — Z79899 Other long term (current) drug therapy: Secondary | ICD-10-CM | POA: Diagnosis not present

## 2021-11-10 DIAGNOSIS — I5032 Chronic diastolic (congestive) heart failure: Secondary | ICD-10-CM | POA: Diagnosis not present

## 2021-11-10 DIAGNOSIS — I10 Essential (primary) hypertension: Secondary | ICD-10-CM | POA: Diagnosis not present

## 2021-11-12 DIAGNOSIS — D0439 Carcinoma in situ of skin of other parts of face: Secondary | ICD-10-CM | POA: Diagnosis not present

## 2021-12-05 IMAGING — CR DG CHEST 2V
2 series · 2 of 2 positions shown · non-contrast
Comparison: June 11, 2020

CLINICAL DATA: Leg swelling

EXAM:
CHEST - 2 VIEW

[w chest pa]
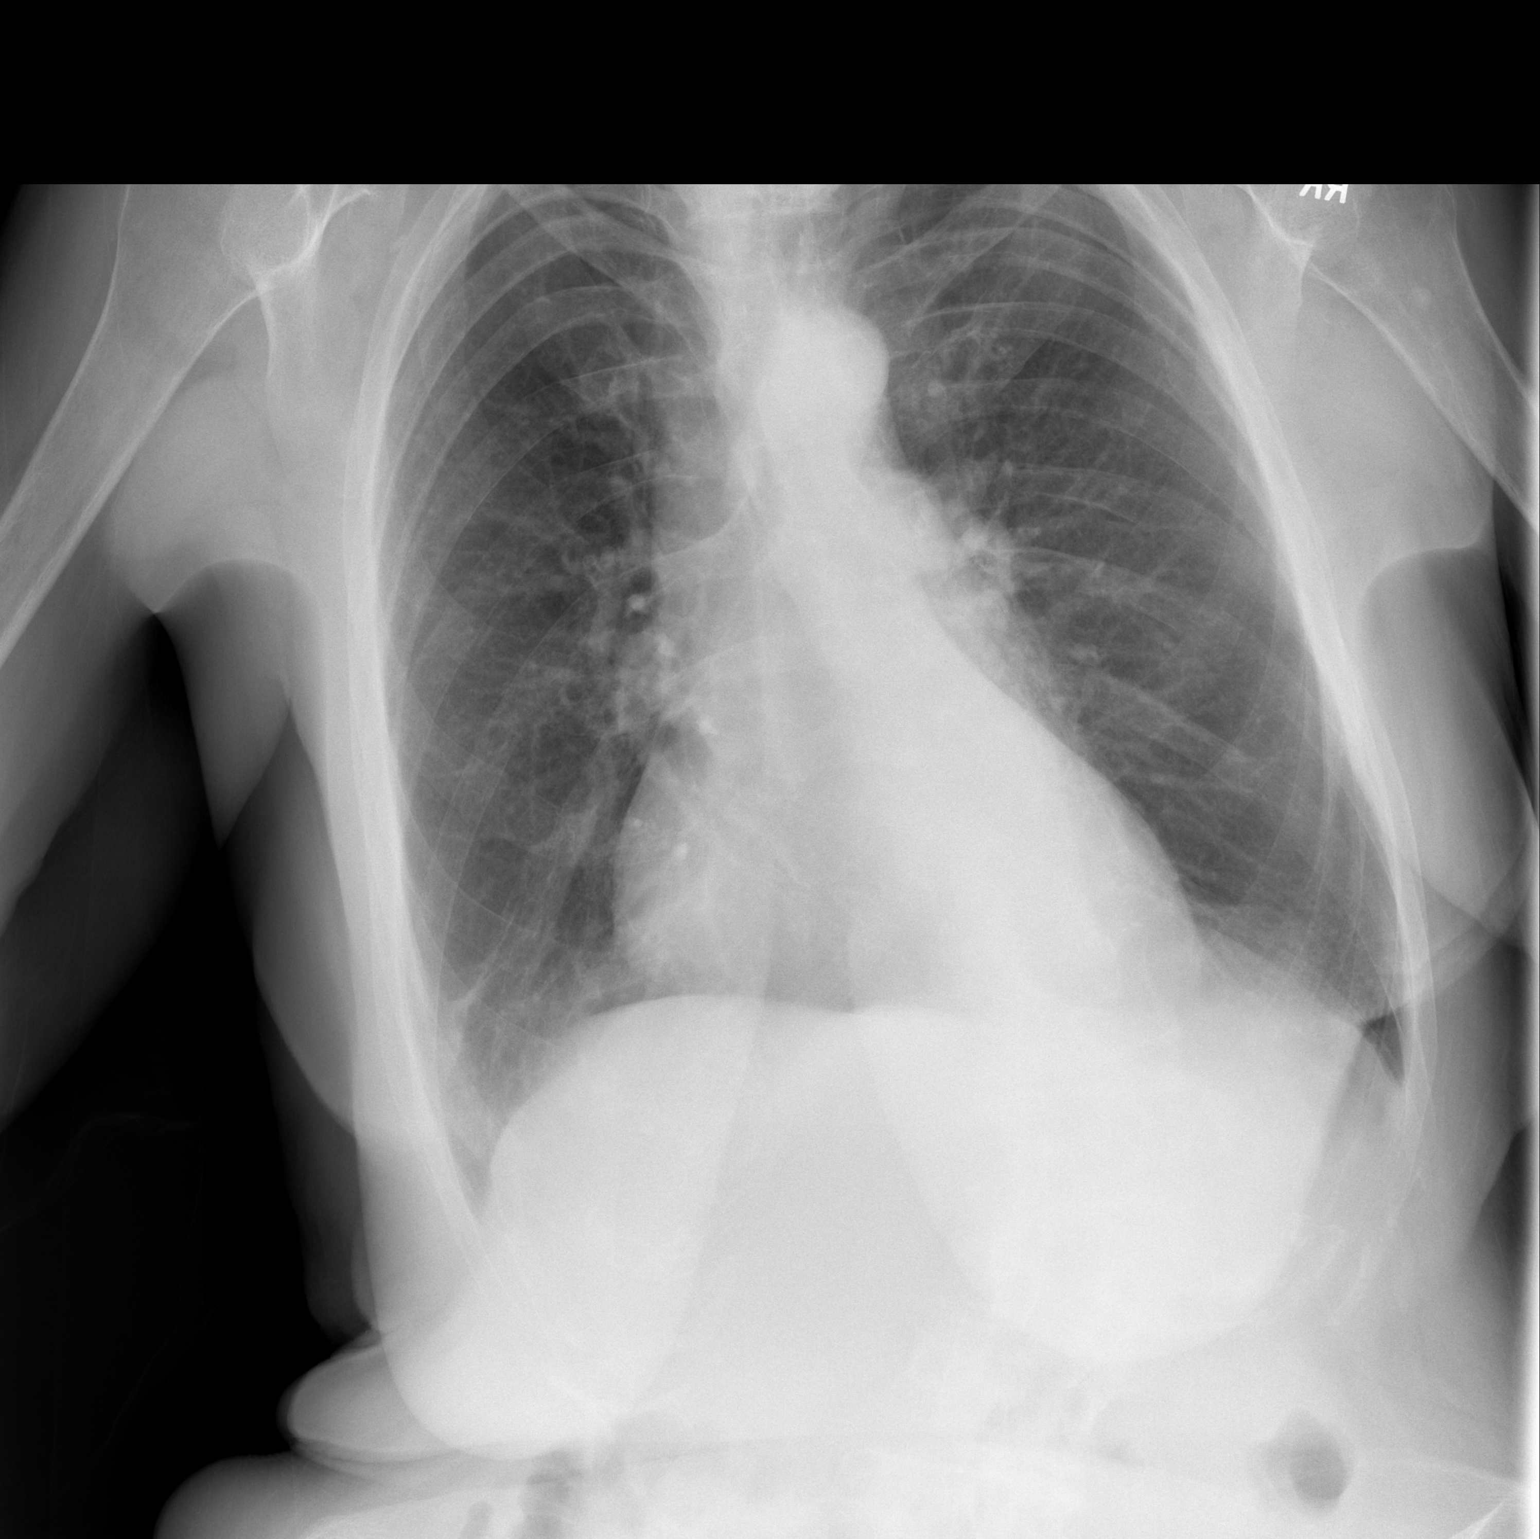

[w chest lat]
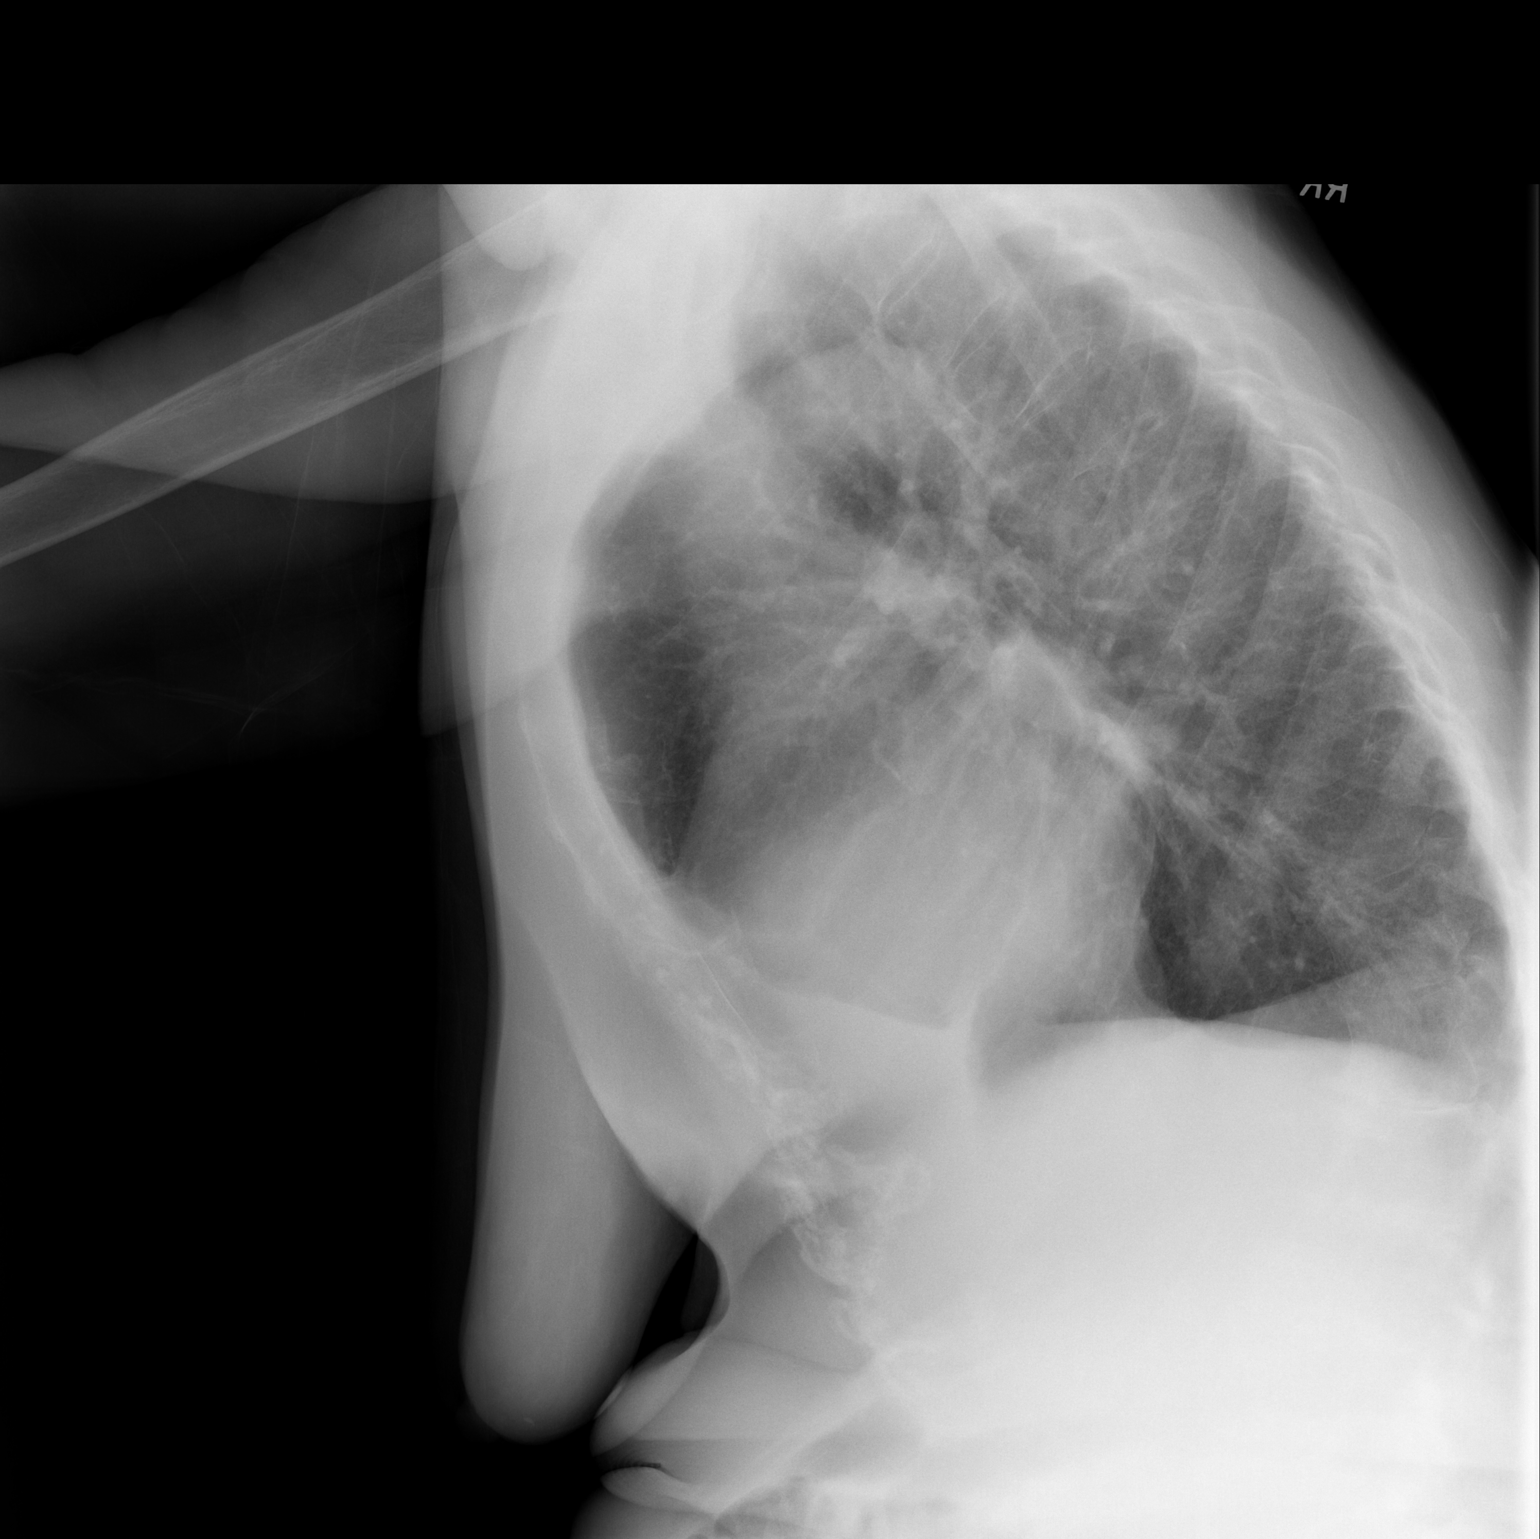

[2 of 2 positions shown; findings below may reference images not displayed]

FINDINGS: The cardiomediastinal silhouette is unchanged and enlarged in
contour. No pleural effusion. No pneumothorax. Unchanged linear
opacity in the RIGHT lung base, likely atelectasis/scar. Visualized
abdomen is unremarkable. Multilevel degenerative changes of the
thoracic spine.
IMPRESSION: No acute cardiopulmonary abnormality.

## 2021-12-07 ENCOUNTER — Ambulatory Visit: Payer: Medicare PPO | Admitting: Cardiology

## 2021-12-09 ENCOUNTER — Ambulatory Visit: Payer: Medicare PPO | Admitting: Cardiology

## 2021-12-15 ENCOUNTER — Telehealth: Payer: Self-pay

## 2021-12-15 ENCOUNTER — Encounter: Payer: Self-pay | Admitting: Cardiology

## 2021-12-15 ENCOUNTER — Ambulatory Visit: Payer: Medicare PPO | Admitting: Cardiology

## 2021-12-15 ENCOUNTER — Other Ambulatory Visit: Payer: Self-pay

## 2021-12-15 VITALS — BP 94/51 | HR 95 | Ht 69.0 in | Wt 148.2 lb

## 2021-12-15 DIAGNOSIS — Z79899 Other long term (current) drug therapy: Secondary | ICD-10-CM

## 2021-12-15 DIAGNOSIS — I130 Hypertensive heart and chronic kidney disease with heart failure and stage 1 through stage 4 chronic kidney disease, or unspecified chronic kidney disease: Secondary | ICD-10-CM | POA: Diagnosis not present

## 2021-12-15 DIAGNOSIS — I272 Pulmonary hypertension, unspecified: Secondary | ICD-10-CM

## 2021-12-15 DIAGNOSIS — I10 Essential (primary) hypertension: Secondary | ICD-10-CM

## 2021-12-15 DIAGNOSIS — I480 Paroxysmal atrial fibrillation: Secondary | ICD-10-CM | POA: Diagnosis not present

## 2021-12-15 DIAGNOSIS — I5032 Chronic diastolic (congestive) heart failure: Secondary | ICD-10-CM | POA: Diagnosis not present

## 2021-12-15 DIAGNOSIS — I2510 Atherosclerotic heart disease of native coronary artery without angina pectoris: Secondary | ICD-10-CM | POA: Diagnosis not present

## 2021-12-15 DIAGNOSIS — I493 Ventricular premature depolarization: Secondary | ICD-10-CM | POA: Diagnosis not present

## 2021-12-15 MED ORDER — METOLAZONE 5 MG PO TABS: 5.0000 mg | ORAL_TABLET | ORAL | 1 refills | Status: AC

## 2021-12-15 NOTE — Patient Instructions (Signed)
Medication Instructions:  ?Your physician has recommended you make the following change in your medication:  ?STOP: Telmisartan (Micardis)  ? ?*If you need a refill on your cardiac medications before your next appointment, please call your pharmacy* ? ? ?Lab Work: ?Your physician recommends that you return for lab work in:  ?TODAY: CMET, Mag, CBC, Vitamin D ?If you have labs (blood work) drawn today and your tests are completely normal, you will receive your results only by: ?MyChart Message (if you have MyChart) OR ?A paper copy in the mail ?If you have any lab test that is abnormal or we need to change your treatment, we will call you to review the results. ? ? ?Testing/Procedures: ?None ? ? ?Follow-Up: ?At [facility], you and your health needs are our priority.  As part of our continuing mission to provide you with exceptional heart care, we have created designated Provider Care Teams.  These Care Teams include your primary Cardiologist (physician) and Advanced Practice Providers (APPs -  Physician Assistants and Nurse Practitioners) who all work together to provide you with the care you need, when you need it. ? ?We recommend signing up for the patient portal called "MyChart".  Sign up information is provided on this After Visit Summary.  MyChart is used to connect with patients for Virtual Visits (Telemedicine).  Patients are able to view lab/test results, encounter notes, upcoming appointments, etc.  Non-urgent messages can be sent to your provider as well.   ?To learn more about what you can do with MyChart, go to [email or web address].   ? ?Your next appointment:   ?4 month(s) ? ?The format for your next appointment:   ?In Person ? ?Provider:   ?Thomasene Ripple, DO   ? ? ?Other Instructions ?  ?

## 2021-12-15 NOTE — Progress Notes (Signed)
?Cardiology Office Note:   ? ?Date:  12/15/2021  ? ?ID:  [name], DOB [birth year], MRN [id number] ? ?PCP:  Nicoletta Dress, MD  ?Cardiologist:  Berniece Salines, DO  ?Electrophysiologist:  None  ? ?Referring MD: Nicoletta Dress, MD  ? ?" I am doing  ? ?History of Present Illness:   ? ?[name] is a 88 y.o. female with a hx of  EF [id number], grade 2 diastolic dysfunction, persistent atrial fibrillation on EliquisIs here today for follow-up visit.  Did see the patient in December 2021 at that time we increased her Lasix to 40 mg in the a.m. and 20 mg in the evening for 3 days. ?  ?I saw the patient on December 29, 2020 at that time I increased her Lasix to 40 mg twice a day with potassium supplement.  I reviewed her echocardiogram which was done at [facility] showing EF 60 to 65% with moderate TR as well as moderate regurgitation.  We continued her Eliquis and her Cardizem for A. fib. ?  ?I saw the patient in March 09, 2021 at that time no medication changes were made.  We talked about her pulmonary hypertension and what it means.  She had no questions at that time. ?  ?I last saw the patient in June 09, 2021 at that time she appeared to be doing well from a cardiovascular standpoint.  We continue her medication with no changes. ?  ?Saw the patient on September 17, 2021 at that time she was experiencing significant leg swelling.  She also noted that she was at [facility] where she was treated for sepsis.  At the conclusion of that visit I changed the patient to torsemide 20 mg daily also gave her Zaroxolyn once a month to help with her fluid overload.   ? ?Her last visit was October 15, 2021 at that time she seems to be responding well to the new regimen of her torsemide and Zaroxolyn. ? ?Since I saw the patient she tells me she has had some hair loss.  We had to transition her off torsemide 40 mg daily to 20 mg today. ? ? ?Past Medical History:  ?Diagnosis Date  ? Abnormal nuclear stress test 11/15/2019   ? Abnormal perfusion scintigraphy 07/22/2014  ? Lexiscan MPS Jan 2012 with mild mis and apical anterior ischemia EF 59% she was seen at [place] Jan 2012 with mild mis and apical anterior ischemia EF 59% she was seen at [facility] of this note might be different from the original. Overview:  Lexiscan MPS Jan 2012 with mild mis and apical anterior ischemia EF 59% she was seen at [facility] of this note might be different from  ? Abnormal results of function studies of other organs and systems 07/22/2014  ? Lexiscan MPS Jan 2012 with mild mis and apical anterior ischemia EF 59% she was seen at [facility] of this note might be different from the original. Overview:  Lexiscan MPS Jan 2012 with mild mis and apical anterior ischemia EF 59% she was seen at [facility]  ? Acute cystitis with hematuria 11/15/2014  ? Anxiety disorder 07/22/2014  ? Atrial fibrillation with rapid ventricular response ([place]) 06/18/2020  ? Atrial fibrillation, currently in sinus rhythm 07/29/2020  ? B12 deficiency 07/22/2014  ? Benign hypertensive heart and kidney disease with chronic kidney disease 07/22/2014  ? Bilateral leg edema 02/20/2021  ? CAD (coronary artery disease) 01/02/2020  ? Cat bite 06/20/2020  ?  Chest tightness 09/10/2019  ? Chronic diastolic heart failure ([place]) 06/24/2020  ? Chronic heart failure with preserved ejection fraction ([place]) 06/24/2020  ? Chronic kidney disease, stage 2 (mild) 07/22/2014  ? Chronic kidney disease, stage 3a ([place]) 07/29/2020  ? Degeneration, intervertebral disc, cervical 07/22/2014  ? Depression, major, single episode, moderate ([place]) 07/22/2014  ? Drug rash 07/22/2014  ? Drug therapy 12/11/2015  ? Dyspnea on exertion 09/10/2019  ? Essential hypertension 09/10/2019  ? Fall at home 07/17/2018  ? Fatigue 07/22/2014  ? Former smoker 10/20/2017  ? Gout 07/22/2014  ? Gross hematuria 10/08/2014  ? History of cardioversion 07/01/2020  ? Hypercholesterolemia 07/22/2014  ? Hypertensive heart and kidney disease with  heart failure ([place]) 07/22/2014  ? Hypertonicity of bladder 07/22/2014  ? Hypothyroidism   ? Hypothyroidism (acquired) 07/22/2014  ? Qualifier: Diagnosis of  By: Loanne Drilling MD, Roena Malady of this note might be different from the original. Overview:  Qualifier: Diagnosis of  By: Loanne Drilling MD, Sean A  ? HYPOTHYROIDISM, POST-RADIATION 06/11/2009  ? Qualifier: Diagnosis of  By: Loanne Drilling MD, Jacelyn Pi   ? Idiopathic chronic gout of multiple sites without tophus 12/11/2015  ? Idiopathic peripheral neuropathy 12/11/2015  ? Increased frequency of urination 07/22/2014  ? Left atrial enlargement 07/22/2014  ? mild  Formatting of this note might be different from the original. mild  ? Lethargy 07/22/2014  ? Localized swelling of both lower legs 06/20/2020  ? Low back pain 07/22/2014  ? Mitral regurgitation 09/10/2019  ? Moderate tricuspid regurgitation 06/18/2020  ? Muscular deconditioning 07/29/2020  ? Nonrheumatic tricuspid valve regurgitation 02/20/2021  ? Obesity 07/22/2014  ? Onychomycosis due to dermatophyte 07/22/2014  ? Osteoarthritis, generalized 07/22/2014  ? Osteopenia 07/22/2014  ? Osteoporosis 08/24/2018  ? DEXA 08/23/2018  Formatting of this note might be different from the original. DEXA 08/23/2018  ? PAF (paroxysmal atrial fibrillation) ([place])   ? Palpitations 09/10/2019  ? PMB (postmenopausal bleeding) 02/20/2021  ? Primary hypertension 07/18/2020  ? Pulmonary hypertension ([place]) 12/08/2017  ? PVC (premature ventricular contraction) 04/17/2018  ? RBBB 10/20/2017  ? Recurrent UTI 12/11/2015  ? S/P total knee arthroplasty 07/29/2020  ? Sciatica 07/22/2014  ? Spinal stenosis of lumbar region 12/11/2015  ? Stenosis of cervix 02/20/2021  ? TMJ arthralgia 07/22/2014  ? Unspecified abnormalities of gait and mobility 07/22/2014  ? Urge incontinence of urine 07/22/2014  ? Wears 8 extra absorbent pads during day an on overnight pad at night  Formatting of this note might be different from the original. Wears 8 extra absorbent pads during day an on overnight  pad at night  ? URINARY INCONTINENCE 06/11/2009  ? Qualifier: Diagnosis of  By: Loanne Drilling MD, Jacelyn Pi   ? Urinary tract infection, site not specified 04/04/2014  ? Varicose veins 07/22/2014  ? Venous insufficiency 07/22/2014  ? Venous stasis dermatitis of both lower extremities 07/22/2014  ? Vitamin D deficiency 07/22/2014  ? ? ?Past Surgical History:  ?Procedure Laterality Date  ? BACK SURGERY    ? CARDIOVERSION N/A 07/01/2020  ? Procedure: CARDIOVERSION;  Surgeon: Lelon Perla, MD;  Location: [facility] ENDOSCOPY;  Service: Cardiovascular;  Laterality: N/A;  ? JOINT REPLACEMENT    ? hip and knee  ? TEE WITHOUT CARDIOVERSION N/A 07/01/2020  ? Procedure: TRANSESOPHAGEAL ECHOCARDIOGRAM (TEE);  Surgeon: Lelon Perla, MD;  Location: [facility] ENDOSCOPY;  Service: Cardiovascular;  Laterality: N/A;  ? ? ?Current Medications: ?Current Meds  ?Medication Sig  ? acetaminophen (TYLENOL) 500 MG tablet Take 1,000  mg by mouth every 6 (six) hours as needed for mild pain.  ? apixaban (ELIQUIS) 2.5 MG TABS tablet Take 1 tablet (2.5 mg total) by mouth 2 (two) times daily.  ? atorvastatin (LIPITOR) 10 MG tablet TAKE 1 TABLET BY MOUTH DAILY.  ? carvedilol (COREG) 3.125 MG tablet Take by mouth 2 (two) times daily.  ? cholecalciferol (VITAMIN D) 1000 UNITS tablet Take 1,000 Units by mouth daily.  ? febuxostat (ULORIC) 40 MG tablet Take 40 mg by mouth daily.  ? hydrocortisone cream 1 % Apply 1 application topically 2 (two) times daily. To back until rash resolved (Patient taking differently: Apply 1 application. topically as needed.)  ? levothyroxine (SYNTHROID) 100 MCG tablet Take 1 tablet (100 mcg total) by mouth daily.  ? torsemide (DEMADEX) 20 MG tablet Take 1 tablet (20 mg total) by mouth once for 1 dose.  ? vitamin B-12 (CYANOCOBALAMIN) 1000 MCG tablet Take 1,000 mcg by mouth daily.  ? [DISCONTINUED] furosemide (LASIX) 20 MG tablet Take 20 mg by mouth daily.  ? [DISCONTINUED] metolazone (ZAROXOLYN) 5 MG tablet Take 1 tablet (5 mg total) by mouth  every 30 (thirty) days. Take 30 minutes for for first Torsemide dose.  ?  ? ?Allergies:   Isosorbide nitrate, Cephalexin, Gentamicin, Penicillins, and Sulfa antibiotics  ? ?Social History  ? ?[place]

## 2021-12-15 NOTE — Telephone Encounter (Signed)
Called pt to reschedule her appt. It's too soon for her 12 week follow up. No answer, left message.   ?

## 2021-12-16 LAB — CBC WITH DIFFERENTIAL/PLATELET
Basophils Absolute: 0.1 10*3/uL (ref 0.0–0.2)
Basos: 1 %
EOS (ABSOLUTE): 0.2 10*3/uL (ref 0.0–0.4)
Eos: 3 %
Hematocrit: 32.3 % — ABNORMAL LOW (ref 34.0–46.6)
Hemoglobin: 10.9 g/dL — ABNORMAL LOW (ref 11.1–15.9)
Immature Grans (Abs): 0 10*3/uL (ref 0.0–0.1)
Immature Granulocytes: 0 %
Lymphocytes Absolute: 1.4 10*3/uL (ref 0.7–3.1)
Lymphs: 20 %
MCH: 31.3 pg (ref 26.6–33.0)
MCHC: 33.7 g/dL (ref 31.5–35.7)
MCV: 93 fL (ref 79–97)
Monocytes Absolute: 0.7 10*3/uL (ref 0.1–0.9)
Monocytes: 9 %
Neutrophils Absolute: 4.7 10*3/uL (ref 1.4–7.0)
Neutrophils: 67 %
Platelets: 257 10*3/uL (ref 150–450)
RBC: 3.48 x10E6/uL — ABNORMAL LOW (ref 3.77–5.28)
RDW: 13.2 % (ref 11.7–15.4)
WBC: 7.1 10*3/uL (ref 3.4–10.8)

## 2021-12-16 LAB — COMPREHENSIVE METABOLIC PANEL
ALT: 9 IU/L (ref 0–32)
AST: 27 IU/L (ref 0–40)
Albumin/Globulin Ratio: 0.9 — ABNORMAL LOW (ref 1.2–2.2)
Albumin: 3.5 g/dL — ABNORMAL LOW (ref 3.6–4.6)
Alkaline Phosphatase: 125 IU/L — ABNORMAL HIGH (ref 44–121)
BUN/Creatinine Ratio: 28 (ref 12–28)
BUN: 53 mg/dL — ABNORMAL HIGH (ref 8–27)
Bilirubin Total: 0.4 mg/dL (ref 0.0–1.2)
CO2: 26 mmol/L (ref 20–29)
Calcium: 9.3 mg/dL (ref 8.7–10.3)
Chloride: 95 mmol/L — ABNORMAL LOW (ref 96–106)
Creatinine, Ser: 1.91 mg/dL — ABNORMAL HIGH (ref 0.57–1.00)
Globulin, Total: 3.9 g/dL (ref 1.5–4.5)
Glucose: 90 mg/dL (ref 70–99)
Potassium: 4.4 mmol/L (ref 3.5–5.2)
Sodium: 135 mmol/L (ref 134–144)
Total Protein: 7.4 g/dL (ref 6.0–8.5)
eGFR: 25 mL/min/{1.73_m2} — ABNORMAL LOW (ref >59)

## 2021-12-16 LAB — VITAMIN D 25 HYDROXY (VIT D DEFICIENCY, FRACTURES): Vit D, 25-Hydroxy: 71.9 ng/mL (ref 30.0–100.0)

## 2021-12-16 LAB — MAGNESIUM: Magnesium: 2.2 mg/dL (ref 1.6–2.3)

## 2021-12-17 ENCOUNTER — Telehealth: Payer: Self-pay | Admitting: Cardiology

## 2021-12-17 NOTE — Telephone Encounter (Signed)
? ?  Pt is calling to f/u lab result and if Dr. Servando Salina will be sending the new prescription she wants her to take  ?

## 2021-12-17 NOTE — Telephone Encounter (Signed)
Attempted to contact patient, did not answer- unable to leave a voicemail.  ? ?Per office note if labs were stable possibly starting amiodarone.  ?Labs are stable- did you want to start this, I did not see any other mention.  ? ?Thank you!  ? ?

## 2021-12-18 ENCOUNTER — Other Ambulatory Visit: Payer: Self-pay

## 2021-12-18 MED ORDER — AMIODARONE HCL 200 MG PO TABS
200.0000 mg | ORAL_TABLET | Freq: Every day | ORAL | 3 refills | Status: AC
Start: 2021-12-18 — End: ?

## 2021-12-18 NOTE — Telephone Encounter (Signed)
Called patient, advised that I did not have an answer today yet on starting Amiodarone- will send message to Dr.Tobb and RN.  ?Patient thankful for call back. ?  ?

## 2021-12-18 NOTE — Telephone Encounter (Signed)
Contacted patient, advised of medication- sent RX to pharmacy.  ?Patient verbalized understanding. ? ?

## 2021-12-18 NOTE — Telephone Encounter (Signed)
Patient calling back.   °

## 2022-01-04 DIAGNOSIS — N1330 Unspecified hydronephrosis: Secondary | ICD-10-CM | POA: Diagnosis not present

## 2022-01-04 DIAGNOSIS — I872 Venous insufficiency (chronic) (peripheral): Secondary | ICD-10-CM | POA: Diagnosis not present

## 2022-01-04 DIAGNOSIS — R109 Unspecified abdominal pain: Secondary | ICD-10-CM | POA: Diagnosis not present

## 2022-01-04 DIAGNOSIS — Z452 Encounter for adjustment and management of vascular access device: Secondary | ICD-10-CM | POA: Diagnosis not present

## 2022-01-04 DIAGNOSIS — Z4659 Encounter for fitting and adjustment of other gastrointestinal appliance and device: Secondary | ICD-10-CM | POA: Diagnosis not present

## 2022-01-04 DIAGNOSIS — R6521 Severe sepsis with septic shock: Secondary | ICD-10-CM | POA: Diagnosis not present

## 2022-01-04 DIAGNOSIS — E871 Hypo-osmolality and hyponatremia: Secondary | ICD-10-CM | POA: Diagnosis not present

## 2022-01-04 DIAGNOSIS — R1084 Generalized abdominal pain: Secondary | ICD-10-CM | POA: Diagnosis not present

## 2022-01-04 DIAGNOSIS — N3001 Acute cystitis with hematuria: Secondary | ICD-10-CM | POA: Diagnosis not present

## 2022-01-04 DIAGNOSIS — A419 Sepsis, unspecified organism: Secondary | ICD-10-CM | POA: Diagnosis not present

## 2022-01-04 DIAGNOSIS — R079 Chest pain, unspecified: Secondary | ICD-10-CM | POA: Diagnosis not present

## 2022-01-04 DIAGNOSIS — I517 Cardiomegaly: Secondary | ICD-10-CM | POA: Diagnosis not present

## 2022-01-04 DIAGNOSIS — R112 Nausea with vomiting, unspecified: Secondary | ICD-10-CM | POA: Diagnosis not present

## 2022-01-04 DIAGNOSIS — R0603 Acute respiratory distress: Secondary | ICD-10-CM | POA: Diagnosis not present

## 2022-01-04 DIAGNOSIS — K5641 Fecal impaction: Secondary | ICD-10-CM | POA: Diagnosis not present

## 2022-01-04 DIAGNOSIS — M1612 Unilateral primary osteoarthritis, left hip: Secondary | ICD-10-CM | POA: Diagnosis not present

## 2022-01-04 DIAGNOSIS — R918 Other nonspecific abnormal finding of lung field: Secondary | ICD-10-CM | POA: Diagnosis not present

## 2022-01-04 DIAGNOSIS — N131 Hydronephrosis with ureteral stricture, not elsewhere classified: Secondary | ICD-10-CM | POA: Diagnosis not present

## 2022-01-04 DIAGNOSIS — I4510 Unspecified right bundle-branch block: Secondary | ICD-10-CM | POA: Diagnosis not present

## 2022-01-04 DIAGNOSIS — Z466 Encounter for fitting and adjustment of urinary device: Secondary | ICD-10-CM | POA: Diagnosis not present

## 2022-01-04 DIAGNOSIS — T83122A Displacement of urinary stent, initial encounter: Secondary | ICD-10-CM | POA: Diagnosis not present

## 2022-01-04 DIAGNOSIS — E8770 Fluid overload, unspecified: Secondary | ICD-10-CM | POA: Diagnosis not present

## 2022-01-04 DIAGNOSIS — K6389 Other specified diseases of intestine: Secondary | ICD-10-CM | POA: Diagnosis not present

## 2022-01-04 DIAGNOSIS — T83592A Infection and inflammatory reaction due to indwelling ureteral stent, initial encounter: Secondary | ICD-10-CM | POA: Diagnosis not present

## 2022-01-04 DIAGNOSIS — N170 Acute kidney failure with tubular necrosis: Secondary | ICD-10-CM | POA: Diagnosis not present

## 2022-01-04 DIAGNOSIS — J9811 Atelectasis: Secondary | ICD-10-CM | POA: Diagnosis not present

## 2022-01-04 DIAGNOSIS — I361 Nonrheumatic tricuspid (valve) insufficiency: Secondary | ICD-10-CM | POA: Diagnosis not present

## 2022-01-04 DIAGNOSIS — G9341 Metabolic encephalopathy: Secondary | ICD-10-CM | POA: Diagnosis not present

## 2022-01-04 DIAGNOSIS — N390 Urinary tract infection, site not specified: Secondary | ICD-10-CM | POA: Diagnosis not present

## 2022-01-04 DIAGNOSIS — I4891 Unspecified atrial fibrillation: Secondary | ICD-10-CM | POA: Diagnosis not present

## 2022-01-04 DIAGNOSIS — R140 Abdominal distension (gaseous): Secondary | ICD-10-CM | POA: Diagnosis not present

## 2022-01-04 DIAGNOSIS — J9601 Acute respiratory failure with hypoxia: Secondary | ICD-10-CM | POA: Diagnosis not present

## 2022-01-04 DIAGNOSIS — N12 Tubulo-interstitial nephritis, not specified as acute or chronic: Secondary | ICD-10-CM | POA: Diagnosis not present

## 2022-01-04 DIAGNOSIS — I700 Atherosclerosis of aorta: Secondary | ICD-10-CM | POA: Diagnosis not present

## 2022-01-04 DIAGNOSIS — K567 Ileus, unspecified: Secondary | ICD-10-CM | POA: Diagnosis not present

## 2022-01-05 DIAGNOSIS — I361 Nonrheumatic tricuspid (valve) insufficiency: Secondary | ICD-10-CM

## 2022-01-05 DIAGNOSIS — N131 Hydronephrosis with ureteral stricture, not elsewhere classified: Secondary | ICD-10-CM | POA: Diagnosis not present

## 2022-01-05 DIAGNOSIS — A419 Sepsis, unspecified organism: Secondary | ICD-10-CM | POA: Diagnosis not present

## 2022-01-07 DIAGNOSIS — I4891 Unspecified atrial fibrillation: Secondary | ICD-10-CM

## 2022-01-07 DIAGNOSIS — I4510 Unspecified right bundle-branch block: Secondary | ICD-10-CM

## 2022-01-18 DEATH — deceased

## 2022-04-09 ENCOUNTER — Ambulatory Visit: Payer: Medicare PPO | Admitting: Cardiology
